# Patient Record
Sex: Male | Born: 1955 | Race: White | Hispanic: No | Marital: Single | Smoking: Former smoker
Health system: Southern US, Community
[De-identification: ages and names within clinical notes are randomized; demographics above are authoritative.]

## PROBLEM LIST (undated history)

## (undated) DIAGNOSIS — F1020 Alcohol dependence, uncomplicated: Secondary | ICD-10-CM

## (undated) DIAGNOSIS — K7460 Unspecified cirrhosis of liver: Secondary | ICD-10-CM

## (undated) DIAGNOSIS — F329 Major depressive disorder, single episode, unspecified: Secondary | ICD-10-CM

## (undated) DIAGNOSIS — R7302 Impaired glucose tolerance (oral): Secondary | ICD-10-CM

## (undated) DIAGNOSIS — I10 Essential (primary) hypertension: Secondary | ICD-10-CM

## (undated) DIAGNOSIS — H269 Unspecified cataract: Secondary | ICD-10-CM

## (undated) DIAGNOSIS — K429 Umbilical hernia without obstruction or gangrene: Secondary | ICD-10-CM

## (undated) DIAGNOSIS — B1920 Unspecified viral hepatitis C without hepatic coma: Secondary | ICD-10-CM

## (undated) DIAGNOSIS — C801 Malignant (primary) neoplasm, unspecified: Secondary | ICD-10-CM

## (undated) DIAGNOSIS — Z720 Tobacco use: Secondary | ICD-10-CM

## (undated) DIAGNOSIS — F32A Depression, unspecified: Secondary | ICD-10-CM

## (undated) HISTORY — DX: Unspecified cataract: H26.9

## (undated) HISTORY — DX: Impaired glucose tolerance (oral): R73.02

## (undated) HISTORY — DX: Tobacco use: Z72.0

## (undated) HISTORY — DX: Malignant (primary) neoplasm, unspecified: C80.1

## (undated) HISTORY — DX: Unspecified cirrhosis of liver: K74.60

## (undated) HISTORY — DX: Depression, unspecified: F32.A

## (undated) HISTORY — DX: Alcohol dependence, uncomplicated: F10.20

## (undated) HISTORY — PX: INGUINAL HERNIA REPAIR: SHX194

## (undated) HISTORY — DX: Umbilical hernia without obstruction or gangrene: K42.9

## (undated) HISTORY — DX: Unspecified viral hepatitis C without hepatic coma: B19.20

## (undated) HISTORY — DX: Major depressive disorder, single episode, unspecified: F32.9

## (undated) HISTORY — PX: CATARACT EXTRACTION: SUR2

## (undated) HISTORY — PX: UMBILICAL HERNIA REPAIR: SHX196

## (undated) HISTORY — DX: Essential (primary) hypertension: I10

---

## 1998-01-10 ENCOUNTER — Encounter: Payer: Self-pay | Admitting: Gastroenterology

## 1998-03-11 ENCOUNTER — Encounter: Payer: Self-pay | Admitting: Gastroenterology

## 1998-06-03 ENCOUNTER — Encounter: Payer: Self-pay | Admitting: Gastroenterology

## 1998-09-22 ENCOUNTER — Encounter: Payer: Self-pay | Admitting: Gastroenterology

## 1999-04-11 ENCOUNTER — Encounter: Payer: Self-pay | Admitting: Gastroenterology

## 1999-07-25 ENCOUNTER — Encounter: Payer: Self-pay | Admitting: Gastroenterology

## 1999-09-07 ENCOUNTER — Encounter: Payer: Self-pay | Admitting: Gastroenterology

## 1999-12-06 ENCOUNTER — Encounter: Payer: Self-pay | Admitting: Gastroenterology

## 2001-07-18 ENCOUNTER — Encounter: Payer: Self-pay | Admitting: Gastroenterology

## 2002-05-29 ENCOUNTER — Encounter: Payer: Self-pay | Admitting: Gastroenterology

## 2002-07-28 ENCOUNTER — Encounter: Payer: Self-pay | Admitting: Gastroenterology

## 2003-05-21 ENCOUNTER — Encounter: Payer: Self-pay | Admitting: Gastroenterology

## 2003-06-11 ENCOUNTER — Encounter: Payer: Self-pay | Admitting: Gastroenterology

## 2004-02-07 ENCOUNTER — Encounter: Payer: Self-pay | Admitting: Gastroenterology

## 2004-02-09 ENCOUNTER — Encounter: Payer: Self-pay | Admitting: Gastroenterology

## 2004-04-21 ENCOUNTER — Ambulatory Visit: Payer: Self-pay | Admitting: Internal Medicine

## 2004-06-28 ENCOUNTER — Ambulatory Visit: Payer: Self-pay | Admitting: Internal Medicine

## 2004-07-11 ENCOUNTER — Ambulatory Visit: Payer: Self-pay | Admitting: Internal Medicine

## 2004-08-30 ENCOUNTER — Ambulatory Visit: Payer: Self-pay | Admitting: Internal Medicine

## 2006-05-01 ENCOUNTER — Ambulatory Visit: Payer: Self-pay | Admitting: Internal Medicine

## 2006-05-01 LAB — CONVERTED CEMR LAB
Albumin: 3.3 g/dL — ABNORMAL LOW (ref 3.5–5.2)
Alkaline Phosphatase: 140 units/L — ABNORMAL HIGH (ref 39–117)
CO2: 30 meq/L (ref 19–32)
Chol/HDL Ratio, serum: 3.4
Cholesterol: 145 mg/dL (ref 0–200)
Creatinine, Ser: 0.7 mg/dL (ref 0.4–1.5)
GFR calc non Af Amer: 127 mL/min
Glucose, Bld: 91 mg/dL (ref 70–99)
HCT: 46.8 % (ref 39.0–52.0)
HDL: 42.7 mg/dL (ref >39.0)
LDL Cholesterol: 90 mg/dL (ref 0–99)
MCHC: 33 g/dL (ref 30.0–36.0)
Platelets: 223 10*3/uL (ref 150–400)
RBC: 4.69 M/uL (ref 4.22–5.81)
RDW: 13.2 % (ref 11.5–14.6)
Sodium: 135 meq/L (ref 135–145)
Total Bilirubin: 0.7 mg/dL (ref 0.3–1.2)
Total Protein: 7 g/dL (ref 6.0–8.3)
Triglyceride fasting, serum: 62 mg/dL (ref 0–149)

## 2006-10-31 ENCOUNTER — Encounter: Payer: Self-pay | Admitting: Internal Medicine

## 2006-10-31 DIAGNOSIS — I10 Essential (primary) hypertension: Secondary | ICD-10-CM | POA: Insufficient documentation

## 2007-06-23 ENCOUNTER — Encounter: Payer: Self-pay | Admitting: Internal Medicine

## 2007-07-08 ENCOUNTER — Ambulatory Visit: Payer: Self-pay | Admitting: Internal Medicine

## 2008-01-05 ENCOUNTER — Telehealth: Payer: Self-pay | Admitting: Internal Medicine

## 2008-03-12 ENCOUNTER — Ambulatory Visit: Payer: Self-pay | Admitting: Internal Medicine

## 2008-03-12 DIAGNOSIS — F411 Generalized anxiety disorder: Secondary | ICD-10-CM | POA: Insufficient documentation

## 2008-04-06 ENCOUNTER — Telehealth: Payer: Self-pay | Admitting: Internal Medicine

## 2008-04-06 LAB — CONVERTED CEMR LAB
AST: 79 units/L — ABNORMAL HIGH (ref 0–37)
Basophils Absolute: 0.1 10*3/uL (ref 0.0–0.1)
Bilirubin, Direct: 0.3 mg/dL (ref 0.0–0.3)
Chloride: 94 meq/L — ABNORMAL LOW (ref 96–112)
Creatinine, Ser: 0.9 mg/dL (ref 0.4–1.5)
Eosinophils Absolute: 0.3 10*3/uL (ref 0.0–0.6)
Eosinophils Relative: 2.4 % (ref 0.0–5.0)
Glucose, Bld: 75 mg/dL (ref 70–99)
HCT: 50.3 % (ref 39.0–52.0)
Hemoglobin: 17 g/dL (ref 13.0–17.0)
MCHC: 33.9 g/dL (ref 30.0–36.0)
MCV: 99.6 fL (ref 78.0–100.0)
Monocytes Absolute: 1.9 10*3/uL — ABNORMAL HIGH (ref 0.2–0.7)
Neutrophils Relative %: 51.3 % (ref 43.0–77.0)
PSA: 3.26 ng/mL (ref 0.10–4.00)
Potassium: 3.8 meq/L (ref 3.5–5.1)
RBC: 5.05 M/uL (ref 4.22–5.81)
RDW: 12.7 % (ref 11.5–14.6)
Sodium: 133 meq/L — ABNORMAL LOW (ref 135–145)
Total Bilirubin: 0.9 mg/dL (ref 0.3–1.2)
WBC: 12.6 10*3/uL — ABNORMAL HIGH (ref 4.5–10.5)

## 2008-04-09 ENCOUNTER — Telehealth: Payer: Self-pay | Admitting: Internal Medicine

## 2008-04-13 HISTORY — PX: COLONOSCOPY: SHX174

## 2008-04-19 ENCOUNTER — Ambulatory Visit: Payer: Self-pay | Admitting: Gastroenterology

## 2008-05-03 ENCOUNTER — Ambulatory Visit: Payer: Self-pay | Admitting: Gastroenterology

## 2008-05-03 ENCOUNTER — Encounter: Payer: Self-pay | Admitting: Gastroenterology

## 2008-05-05 ENCOUNTER — Encounter: Payer: Self-pay | Admitting: Gastroenterology

## 2008-05-11 ENCOUNTER — Telehealth: Payer: Self-pay | Admitting: Internal Medicine

## 2008-05-28 ENCOUNTER — Encounter: Payer: Self-pay | Admitting: Internal Medicine

## 2009-01-26 ENCOUNTER — Ambulatory Visit: Payer: Self-pay | Admitting: Internal Medicine

## 2009-01-26 DIAGNOSIS — R634 Abnormal weight loss: Secondary | ICD-10-CM | POA: Insufficient documentation

## 2009-01-26 LAB — CONVERTED CEMR LAB
ALT: 84 units/L — ABNORMAL HIGH (ref 0–53)
AST: 80 units/L — ABNORMAL HIGH (ref 0–37)
Alkaline Phosphatase: 112 units/L (ref 39–117)
Basophils Relative: 0.3 % (ref 0.0–3.0)
Bilirubin, Direct: 0.2 mg/dL (ref 0.0–0.3)
Calcium: 8.3 mg/dL — ABNORMAL LOW (ref 8.4–10.5)
Creatinine, Ser: 1.1 mg/dL (ref 0.4–1.5)
Eosinophils Absolute: 0 10*3/uL (ref 0.0–0.7)
Eosinophils Relative: 0.2 % (ref 0.0–5.0)
GFR calc non Af Amer: 74.47 mL/min (ref >60)
Hemoglobin: 15.9 g/dL (ref 13.0–17.0)
Lymphocytes Relative: 4.4 % — ABNORMAL LOW (ref 12.0–46.0)
Monocytes Relative: 11.6 % (ref 3.0–12.0)
Neutrophils Relative %: 83.5 % — ABNORMAL HIGH (ref 43.0–77.0)
RBC: 4.59 M/uL (ref 4.22–5.81)
Sodium: 136 meq/L (ref 135–145)
Total Protein: 6.7 g/dL (ref 6.0–8.3)
WBC: 17 10*3/uL — ABNORMAL HIGH (ref 4.5–10.5)

## 2009-01-28 ENCOUNTER — Encounter: Admission: RE | Admit: 2009-01-28 | Discharge: 2009-01-28 | Payer: Self-pay | Admitting: Internal Medicine

## 2009-01-31 ENCOUNTER — Telehealth: Payer: Self-pay | Admitting: Internal Medicine

## 2009-01-31 DIAGNOSIS — R1084 Generalized abdominal pain: Secondary | ICD-10-CM | POA: Insufficient documentation

## 2009-02-02 ENCOUNTER — Ambulatory Visit: Payer: Self-pay | Admitting: Internal Medicine

## 2009-02-02 DIAGNOSIS — F10239 Alcohol dependence with withdrawal, unspecified: Secondary | ICD-10-CM | POA: Insufficient documentation

## 2009-02-02 DIAGNOSIS — K5900 Constipation, unspecified: Secondary | ICD-10-CM | POA: Insufficient documentation

## 2009-02-02 DIAGNOSIS — F10939 Alcohol use, unspecified with withdrawal, unspecified: Secondary | ICD-10-CM | POA: Insufficient documentation

## 2009-02-06 ENCOUNTER — Encounter: Admission: RE | Admit: 2009-02-06 | Discharge: 2009-02-06 | Payer: Self-pay | Admitting: Internal Medicine

## 2009-02-07 ENCOUNTER — Telehealth (INDEPENDENT_AMBULATORY_CARE_PROVIDER_SITE_OTHER): Payer: Self-pay | Admitting: *Deleted

## 2009-03-11 ENCOUNTER — Ambulatory Visit: Payer: Self-pay | Admitting: Internal Medicine

## 2009-06-22 ENCOUNTER — Emergency Department (HOSPITAL_COMMUNITY): Admission: EM | Admit: 2009-06-22 | Discharge: 2009-06-22 | Payer: Self-pay | Admitting: Family Medicine

## 2009-07-12 HISTORY — PX: OTHER SURGICAL HISTORY: SHX169

## 2009-07-13 ENCOUNTER — Ambulatory Visit (HOSPITAL_COMMUNITY): Admission: RE | Admit: 2009-07-13 | Discharge: 2009-07-13 | Payer: Self-pay | Admitting: Emergency Medicine

## 2009-07-13 ENCOUNTER — Emergency Department (HOSPITAL_COMMUNITY): Admission: EM | Admit: 2009-07-13 | Discharge: 2009-07-13 | Payer: Self-pay | Admitting: Emergency Medicine

## 2009-07-15 ENCOUNTER — Ambulatory Visit: Payer: Self-pay | Admitting: Internal Medicine

## 2009-07-27 ENCOUNTER — Telehealth: Payer: Self-pay | Admitting: Internal Medicine

## 2009-07-28 ENCOUNTER — Encounter: Payer: Self-pay | Admitting: Internal Medicine

## 2009-08-02 ENCOUNTER — Ambulatory Visit (HOSPITAL_BASED_OUTPATIENT_CLINIC_OR_DEPARTMENT_OTHER): Admission: RE | Admit: 2009-08-02 | Discharge: 2009-08-02 | Payer: Self-pay | Admitting: Urology

## 2009-08-11 ENCOUNTER — Encounter: Payer: Self-pay | Admitting: Internal Medicine

## 2009-08-30 ENCOUNTER — Ambulatory Visit: Payer: Self-pay | Admitting: Internal Medicine

## 2009-08-30 ENCOUNTER — Telehealth: Payer: Self-pay

## 2009-08-30 DIAGNOSIS — K7030 Alcoholic cirrhosis of liver without ascites: Secondary | ICD-10-CM | POA: Insufficient documentation

## 2009-08-30 LAB — CONVERTED CEMR LAB
Albumin: 2.7 g/dL — ABNORMAL LOW (ref 3.5–5.2)
BUN: 7 mg/dL (ref 6–23)
Basophils Absolute: 0 10*3/uL (ref 0.0–0.1)
Creatinine, Ser: 0.8 mg/dL (ref 0.4–1.5)
GFR calc non Af Amer: 107.3 mL/min (ref >60)
Glucose, Bld: 107 mg/dL — ABNORMAL HIGH (ref 70–99)
HCT: 45.5 % (ref 39.0–52.0)
Lymphs Abs: 3.2 10*3/uL (ref 0.7–4.0)
MCV: 98.2 fL (ref 78.0–100.0)
Monocytes Absolute: 1.9 10*3/uL — ABNORMAL HIGH (ref 0.1–1.0)
Monocytes Relative: 11.7 % (ref 3.0–12.0)
Platelets: 262 10*3/uL (ref 150.0–400.0)
Potassium: 2.8 meq/L — CL (ref 3.5–5.1)
RDW: 13.8 % (ref 11.5–14.6)
TSH: 1.72 microintl units/mL (ref 0.35–5.50)

## 2009-09-08 ENCOUNTER — Ambulatory Visit: Payer: Self-pay | Admitting: Internal Medicine

## 2009-09-08 ENCOUNTER — Encounter (INDEPENDENT_AMBULATORY_CARE_PROVIDER_SITE_OTHER): Payer: Self-pay | Admitting: *Deleted

## 2009-09-08 LAB — CONVERTED CEMR LAB
Basophils Relative: 0.7 % (ref 0.0–3.0)
CO2: 36 meq/L — ABNORMAL HIGH (ref 19–32)
Chloride: 96 meq/L (ref 96–112)
Eosinophils Absolute: 0.3 10*3/uL (ref 0.0–0.7)
HCT: 47.3 % (ref 39.0–52.0)
Hemoglobin: 16.1 g/dL (ref 13.0–17.0)
MCHC: 34.2 g/dL (ref 30.0–36.0)
MCV: 96.6 fL (ref 78.0–100.0)
Monocytes Absolute: 1.9 10*3/uL — ABNORMAL HIGH (ref 0.1–1.0)
Neutro Abs: 6.5 10*3/uL (ref 1.4–7.7)
RBC: 4.89 M/uL (ref 4.22–5.81)
Sodium: 141 meq/L (ref 135–145)

## 2009-09-16 ENCOUNTER — Ambulatory Visit: Payer: Self-pay | Admitting: Gastroenterology

## 2009-09-16 DIAGNOSIS — J449 Chronic obstructive pulmonary disease, unspecified: Secondary | ICD-10-CM | POA: Insufficient documentation

## 2009-09-16 DIAGNOSIS — R197 Diarrhea, unspecified: Secondary | ICD-10-CM | POA: Insufficient documentation

## 2009-09-16 DIAGNOSIS — M2550 Pain in unspecified joint: Secondary | ICD-10-CM | POA: Insufficient documentation

## 2009-09-16 DIAGNOSIS — N508 Other specified disorders of male genital organs: Secondary | ICD-10-CM | POA: Insufficient documentation

## 2009-09-16 DIAGNOSIS — K219 Gastro-esophageal reflux disease without esophagitis: Secondary | ICD-10-CM | POA: Insufficient documentation

## 2009-09-16 DIAGNOSIS — R160 Hepatomegaly, not elsewhere classified: Secondary | ICD-10-CM | POA: Insufficient documentation

## 2009-09-16 LAB — CONVERTED CEMR LAB
AST: 45 units/L — ABNORMAL HIGH (ref 0–37)
BUN: 7 mg/dL (ref 6–23)
Basophils Relative: 0.7 % (ref 0.0–3.0)
Calcium: 8.8 mg/dL (ref 8.4–10.5)
Creatinine, Ser: 0.5 mg/dL (ref 0.4–1.5)
Eosinophils Absolute: 0.5 10*3/uL (ref 0.0–0.7)
Ferritin: 206.9 ng/mL (ref 22.0–322.0)
Folate: 4 ng/mL
HCV Quantitative: 1690000 intl units/mL — ABNORMAL HIGH (ref <43)
Hep A Total Ab: POSITIVE — AB
Hepatitis B Surface Ag: NEGATIVE
Iron: 64 ug/dL (ref 42–165)
MCHC: 34.1 g/dL (ref 30.0–36.0)
MCV: 97.6 fL (ref 78.0–100.0)
Magnesium: 2 mg/dL (ref 1.5–2.5)
Monocytes Absolute: 1.6 10*3/uL — ABNORMAL HIGH (ref 0.1–1.0)
Neutrophils Relative %: 51 % (ref 43.0–77.0)
Platelets: 247 10*3/uL (ref 150.0–400.0)
Potassium: 3.8 meq/L (ref 3.5–5.1)
RBC: 4.71 M/uL (ref 4.22–5.81)
TSH: 1.99 microintl units/mL (ref 0.35–5.50)
Total Bilirubin: 0.4 mg/dL (ref 0.3–1.2)
Vitamin B-12: 833 pg/mL (ref 211–911)

## 2009-09-19 ENCOUNTER — Ambulatory Visit (HOSPITAL_COMMUNITY): Admission: RE | Admit: 2009-09-19 | Discharge: 2009-09-19 | Payer: Self-pay | Admitting: Gastroenterology

## 2009-09-19 ENCOUNTER — Encounter: Payer: Self-pay | Admitting: Gastroenterology

## 2009-09-20 ENCOUNTER — Telehealth: Payer: Self-pay | Admitting: Gastroenterology

## 2009-09-22 ENCOUNTER — Encounter: Payer: Self-pay | Admitting: Internal Medicine

## 2009-12-09 ENCOUNTER — Ambulatory Visit: Payer: Self-pay | Admitting: Internal Medicine

## 2010-01-22 ENCOUNTER — Emergency Department (HOSPITAL_COMMUNITY): Admission: EM | Admit: 2010-01-22 | Discharge: 2010-01-22 | Payer: Self-pay | Admitting: Emergency Medicine

## 2010-02-14 ENCOUNTER — Telehealth: Payer: Self-pay | Admitting: Internal Medicine

## 2010-05-25 ENCOUNTER — Other Ambulatory Visit: Payer: Self-pay | Admitting: Internal Medicine

## 2010-05-25 ENCOUNTER — Ambulatory Visit
Admission: RE | Admit: 2010-05-25 | Discharge: 2010-05-25 | Payer: Self-pay | Source: Home / Self Care | Attending: Internal Medicine | Admitting: Internal Medicine

## 2010-05-25 DIAGNOSIS — F341 Dysthymic disorder: Secondary | ICD-10-CM | POA: Insufficient documentation

## 2010-05-25 LAB — BASIC METABOLIC PANEL
BUN: 13 mg/dL (ref 6–23)
CO2: 27 mEq/L (ref 19–32)
Calcium: 8.7 mg/dL (ref 8.4–10.5)
Chloride: 105 mEq/L (ref 96–112)
Creatinine, Ser: 0.6 mg/dL (ref 0.4–1.5)
GFR: 149.13 mL/min (ref >60.00)
Glucose, Bld: 69 mg/dL — ABNORMAL LOW (ref 70–99)
Potassium: 3.7 mEq/L (ref 3.5–5.1)
Sodium: 143 mEq/L (ref 135–145)

## 2010-05-25 LAB — HEPATIC FUNCTION PANEL
ALT: 66 U/L — ABNORMAL HIGH (ref 0–53)
AST: 90 U/L — ABNORMAL HIGH (ref 0–37)
Albumin: 3.7 g/dL (ref 3.5–5.2)
Alkaline Phosphatase: 154 U/L — ABNORMAL HIGH (ref 39–117)
Bilirubin, Direct: 0.1 mg/dL (ref 0.0–0.3)
Total Bilirubin: 0.3 mg/dL (ref 0.3–1.2)
Total Protein: 7.3 g/dL (ref 6.0–8.3)

## 2010-05-25 LAB — CBC WITH DIFFERENTIAL/PLATELET
Basophils Absolute: 0.1 10*3/uL (ref 0.0–0.1)
Basophils Relative: 0.9 % (ref 0.0–3.0)
Eosinophils Absolute: 0.5 10*3/uL (ref 0.0–0.7)
Eosinophils Relative: 4.5 % (ref 0.0–5.0)
HCT: 47.2 % (ref 39.0–52.0)
Hemoglobin: 16 g/dL (ref 13.0–17.0)
Lymphocytes Relative: 40.7 % (ref 12.0–46.0)
Lymphs Abs: 4.1 10*3/uL — ABNORMAL HIGH (ref 0.7–4.0)
MCHC: 33.9 g/dL (ref 30.0–36.0)
MCV: 102.5 fl — ABNORMAL HIGH (ref 78.0–100.0)
Monocytes Absolute: 1.4 10*3/uL — ABNORMAL HIGH (ref 0.1–1.0)
Monocytes Relative: 14.3 % — ABNORMAL HIGH (ref 3.0–12.0)
Neutro Abs: 4 10*3/uL (ref 1.4–7.7)
Neutrophils Relative %: 39.6 % — ABNORMAL LOW (ref 43.0–77.0)
Platelets: 294 10*3/uL (ref 150.0–400.0)
RBC: 4.61 Mil/uL (ref 4.22–5.81)
RDW: 13.5 % (ref 11.5–14.6)
WBC: 10 10*3/uL (ref 4.5–10.5)

## 2010-05-25 LAB — TSH: TSH: 3.16 u[IU]/mL (ref 0.35–5.50)

## 2010-06-13 NOTE — Letter (Signed)
Summary: [facility]  [facility]   Imported By: Sherian Rein 10/03/2009 10:41:15  _____________________________________________________________________  External Attachment:    Type:   Image     Comment:   External Document

## 2010-06-13 NOTE — Letter (Signed)
Summary: [facility]  [facility]   Imported By: Sherian Rein 10/03/2009 10:42:56  _____________________________________________________________________  External Attachment:    Type:   Image     Comment:   External Document

## 2010-06-13 NOTE — Letter (Signed)
Summary: [facility]  [facility]   Imported By: Sherian Rein 10/03/2009 10:46:50  _____________________________________________________________________  External Attachment:    Type:   Image     Comment:   External Document

## 2010-06-13 NOTE — Letter (Signed)
Summary: [facility]  [facility]   Imported By: Sherian Rein 10/03/2009 10:37:36  _____________________________________________________________________  External Attachment:    Type:   Image     Comment:   External Document

## 2010-06-13 NOTE — Letter (Signed)
Summary: [facility]  [facility]   Imported By: Sherian Rein 10/03/2009 10:42:03  _____________________________________________________________________  External Attachment:    Type:   Image     Comment:   External Document

## 2010-06-13 NOTE — Assessment & Plan Note (Signed)
Summary: fup er---contusion of testicle//ccm   Vital Signs:  Patient profile:   55 year old male Weight:      166 pounds Temp:     97.8 degrees F oral BP sitting:   110 / 64  (right arm) Cuff size:   regular  Vitals Entered By: Duard Brady LPN (July 16, 5407 1:09 PM) CC: c/o injury to (L) testical - 3days ago , swelling , seen in ER Is Patient Diabetic? No   CC:  c/o injury to (L) testical - 3days ago , swelling , and seen in ER.  History of Present Illness: 55 year old patient seen today  for follow-up; he was evaluated in the ED two days ago after a trauma to the left testicular area.  he was retraumatized with a 100-pound dog yesterday. He has been using ibuprofen analgesics and ice.  He has a history of EtOH and hepatitis C.  He states that he has been abstinent for 3 months.  He has treated hypertension, but apparently presently on no medication.  He has a history of anxiety, depression.  Preventive Screening-Counseling & Management  Alcohol-Tobacco     Smoking Status: current     Smoking Cessation Counseling: yes  Allergies (verified): No Known Drug Allergies  Past History:  Past Medical History: Reviewed history from 02/02/2009 and no changes required. Hepatitis C Hypertension  tobacco use chronic alcoholism umbilical  hernia glucose intolerance history depression weight loss cirrhosis  Past Surgical History: Reviewed history from 02/02/2009 and no changes required. Cataract extraction Inguinal herniorrhaphy umbilical hernia repair  Review of Systems       The patient complains of testicular masses.  The patient denies anorexia, fever, weight loss, weight gain, vision loss, decreased hearing, hoarseness, chest pain, syncope, dyspnea on exertion, peripheral edema, prolonged cough, headaches, hemoptysis, abdominal pain, melena, hematochezia, severe indigestion/heartburn, hematuria, incontinence, genital sores, muscle weakness, suspicious skin lesions,  transient blindness, difficulty walking, depression, unusual weight change, abnormal bleeding, enlarged lymph nodes, angioedema, and breast masses.    Physical Exam  General:  Well-developed,well-nourished,in no acute distress; alert,appropriate and cooperative throughout examination;blood pressure  120/72 Head:  Normocephalic and atraumatic without obvious abnormalities. No apparent alopecia or balding. Eyes:  No corneal or conjunctival inflammation noted. EOMI. Perrla. Funduscopic exam benign, without hemorrhages, exudates or papilledema. Vision grossly normal. Mouth:  Oral mucosa and oropharynx without lesions or exudates.  Teeth in good repair. Neck:  No deformities, masses, or tenderness noted. Lungs:  Normal respiratory effort, chest expands symmetrically. Lungs are clear to auscultation, no crackles or wheezes. Heart:  Normal rate and regular rhythm. S1 and S2 normal without gallop, murmur, click, rub or other extra sounds. Abdomen:  hepato- megaly Genitalia:  left testicular swelling and pain Msk:  No deformity or scoliosis noted of thoracic or lumbar spine.   Extremities:  digital clubbing   Impression & Recommendations:  Problem # 1:  ANXIETY DISORDER (ICD-300.00)  His updated medication list for this problem includes:    Trazodone Hcl 50 Mg Tabs (Trazodone hcl) .Marland Kitchen... 2 at bedtime    Citalopram Hydrobromide 20 Mg Tabs (Citalopram hydrobromide) ..... One daily  His updated medication list for this problem includes:    Trazodone Hcl 50 Mg Tabs (Trazodone hcl) .Marland Kitchen... 2 at bedtime    Citalopram Hydrobromide 20 Mg Tabs (Citalopram hydrobromide) ..... One daily  Problem # 2:  HYPERTENSION (ICD-401.9) presently  controlled off medication  Problem # 3:  HEPATITIS C (ICD-070.51) abdominal MRI performed September last year  Complete  Medication List: 1)  Trazodone Hcl 50 Mg Tabs (Trazodone hcl) .... 2 at bedtime 2)  Citalopram Hydrobromide 20 Mg Tabs (Citalopram hydrobromide) ....  One daily 3)  Thiamine Hcl 100 Mg Tabs (Thiamine hcl) .... One daily 4)  Hydrocodone-acetaminophen 5-500 Mg Tabs (Hydrocodone-acetaminophen) .... One every 6 hours as needed for pain  Patient Instructions: 1)  Please schedule a follow-up appointment in 3 months. 2)  Limit your Sodium (Salt). 3)  Tobacco is very bad for your health and your loved ones! You Should stop smoking!. 4)  It is important that you exercise regularly at least 20 minutes 5 times a week. If you develop chest pain, have severe difficulty breathing, or feel very tired , stop exercising immediately and seek medical attention. Prescriptions: HYDROCODONE-ACETAMINOPHEN 5-500 MG TABS (HYDROCODONE-ACETAMINOPHEN) one every 6 hours as needed for pain  #30 x 0   Entered and Authorized by:   Gordy Savers  MD   Signed by:   Gordy Savers  MD on 07/15/2009   Method used:   Print then Give to Patient   RxID:   [id number] THIAMINE HCL 100 MG TABS (THIAMINE HCL) one daily  #100 x 6   Entered and Authorized by:   Gordy Savers  MD   Signed by:   Gordy Savers  MD on 07/15/2009   Method used:   Electronically to        [facility]  [phone]* (retail)       [address] [place], [place]  [address]       Ph: [phone] or [phone]       Fax: [phone]   RxID:   [id number] CITALOPRAM HYDROBROMIDE 20 MG TABS (CITALOPRAM HYDROBROMIDE) one daily  #90 Tablet x 6   Entered and Authorized by:   Gordy Savers  MD   Signed by:   Gordy Savers  MD on 07/15/2009   Method used:   Electronically to        [facility]  [phone]* (retail)       [address] [place], [place]  [address]       Ph: [phone] or [phone]       Fax: [phone]   RxID:   [id number] TRAZODONE HCL 50 MG  TABS (TRAZODONE HCL) 2 at bedtime  #180 x 4   Entered and Authorized by:   Gordy Savers  MD   Signed by:   Gordy Savers  MD on 07/15/2009   Method used:    Electronically to        [facility]  [phone]* (retail)       [address] [place], [place]  [address]       Ph: [phone] or [phone]       Fax: [phone]   RxID:   [id number]

## 2010-06-13 NOTE — Assessment & Plan Note (Signed)
Summary: 3 MO ROV/MM   Vital Signs:  Patient profile:   55 year old male Weight:      157 pounds Temp:     97.9 degrees F oral BP sitting:   150 / 100  (right arm) Cuff size:   regular  Vitals Entered By: Duard Brady LPN (December 09, 2009 8:14 AM) CC: 3 mos rov - doing ok Is Patient Diabetic? No   Primary Care Provider:  Gordy Savers  MD  CC:  3 mos rov - doing ok.  History of Present Illness: 55 year old patient who is seen today for follow-up.  He has a history of chronic hepatitis c and chronic alcoholism.  He states that he has been abstinent since May 16.  Today he feels quite well.  He does have a history of hypertension that has required treatment in the past.  The pressure medication was tapered earlier the two hypotension associated with weight loss.  He continues to monitor home blood pressure readings and states blood pressures are usually a normal range  Preventive Screening-Counseling & Management  Alcohol-Tobacco     Smoking Status: current  Allergies (verified): No Known Drug Allergies  Past History:  Past Medical History: Reviewed history from 02/02/2009 and no changes required. Hepatitis C Hypertension  tobacco use chronic alcoholism umbilical  hernia glucose intolerance history depression weight loss cirrhosis  Review of Systems  The patient denies anorexia, fever, weight loss, weight gain, vision loss, decreased hearing, hoarseness, chest pain, syncope, dyspnea on exertion, peripheral edema, prolonged cough, headaches, hemoptysis, abdominal pain, melena, hematochezia, severe indigestion/heartburn, hematuria, incontinence, genital sores, muscle weakness, suspicious skin lesions, transient blindness, difficulty walking, depression, unusual weight change, abnormal bleeding, enlarged lymph nodes, angioedema, breast masses, and testicular masses.    Physical Exam  General:  Well-developed,well-nourished,in no acute distress;  alert,appropriate and cooperative throughout examination; 150/90 Head:  Normocephalic and atraumatic without obvious abnormalities. No apparent alopecia or balding. Eyes:  No corneal or conjunctival inflammation noted. EOMI. Perrla. Funduscopic exam benign, without hemorrhages, exudates or papilledema. Vision grossly normal. Mouth:  Oral mucosa and oropharynx without lesions or exudates.  Teeth in good repair. Neck:  No deformities, masses, or tenderness noted. Lungs:  Normal respiratory effort, chest expands symmetrically. Lungs are clear to auscultation, no crackles or wheezes. Heart:  Normal rate and regular rhythm. S1 and S2 normal without gallop, murmur, click, rub or other extra sounds. Abdomen:  hepato- megaly Msk:  No deformity or scoliosis noted of thoracic or lumbar spine.   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 2 at bedtime 2)  Citalopram Hydrobromide 20 Mg Tabs (Citalopram hydrobromide) .... One daily 3)  Thiamine Hcl 100 Mg Tabs (Thiamine hcl) .... One daily 4)  Klor-con M20 20 Meq Cr-tabs (Potassium chloride crys cr) .... 2 by mouth two times a day for 5 days then 1 by mouth bid 5)  Triamcinolone Acetonide 0.1 % Crea (Triamcinolone acetonide) .... Use twice daily 6)  Dexilant 60 Mg Cpdr (Dexlansoprazole) .... Take one by mouth once daily 7)  Metronidazole 250 Mg Tabs (Metronidazole) .... Three times a day 8)  Florastor 250 Mg Caps (Saccharomyces boulardii) .... Daily x 10 days 9)  Folic Acid 1 Mg  Tabs (Folic acid) .Marland Kitchen.. 1 qd  Patient Instructions: 1)  Please schedule a follow-up appointment in 3 months. 2)  Limit your Sodium (Salt). 3)  It is important that you  exercise regularly at least 20 minutes 5 times a week. If you develop chest pain, have severe difficulty breathing, or feel very tired , stop exercising immediately and seek medical attention. 4)  Check your Blood Pressure regularly. If it is above: 150/90 you should make an appointment.

## 2010-06-13 NOTE — Letter (Signed)
Summary: [facility]  [facility]   Imported By: Sherian Rein 10/03/2009 10:44:20  _____________________________________________________________________  External Attachment:    Type:   Image     Comment:   External Document

## 2010-06-13 NOTE — Letter (Signed)
Summary: [facility]  [facility]   Imported By: Sherian Rein 10/03/2009 10:46:02  _____________________________________________________________________  External Attachment:    Type:   Image     Comment:   External Document

## 2010-06-13 NOTE — Assessment & Plan Note (Signed)
Summary: weight loss, hepc, cirrhosis/lk    History of Present Illness Visit Type: Initial Consult Primary GI MD: Sheryn Bison MD FACP FAGA Primary Provider: Gordy Savers  MD Requesting Provider: Gordy Savers  MD Chief Complaint: Patient referred for weight loss but he feels he is gaining some weight. He states he is having some fecal incontinence, he has to wear a depends when he goes out to prevent any accident from happening. Patient does have hep C.  History of Present Illness:   Complicated 55 year old chronic alcoholic with chronic hepatitis C previously under the care of Dr. Bernette Redbird at [facility] gastroenterology. I have no records for review. Apparently he was not felt to be a candidate for therapy because of active alcoholism. He is currently followed by Dr. Beverely Low in primary care.  Patient was recently hospitalized and apparent had an orchiectomy performed, was placed on antibiotics, and has had 2 months of persistent diarrhea and abdominal cramping. I did colonoscopy screening several years ago that was unremarkable. He currently is doing better with his diarrhea but continues to have some incontinency. He denies rectal bleeding, fever, chills, or other systemic complaints. He allegedly has not used alcohol since Super Bowl weekend and denies IV drug use. Apparently in the past he has used drugs and this is how he contacted hepatitis C. He denies current mental status changes, nausea vomiting, but does have chronic GERD and has recently been placed on Dexilant 60 mg with good improvement. He denies any episodes of GI hemorrhage, clay colored stools, dark urine, fever or chills. Review of his labs does show previous evidence of alcoholic hepatitis, but recent liver function tests had fairly normalized except for low albumin level. His neck problems previously with ascites, edema, or any other metabolic problems. He is on thiamine capsules, citalopram, and trazodone.  He continues to smoke heavily. He has had previous repair of an umbilical and inguinal hernia. He lives with his stepfather Dr. Roseanne Kaufman who is a retired Development worker, community.   GI Review of Systems    Reports abdominal pain.     Location of  Abdominal pain: lower abdomen.    Denies acid reflux, belching, bloating, chest pain, dysphagia with liquids, dysphagia with solids, heartburn, loss of appetite, nausea, vomiting, vomiting blood, weight loss, and  weight gain.      Reports change in bowel habits, constipation, diarrhea, fecal incontinence, and  liver problems.     Denies anal fissure, black tarry stools, diverticulosis, heme positive stool, hemorrhoids, irritable bowel syndrome, jaundice, light color stool, rectal bleeding, and  rectal pain.    Current Medications (verified): 1)  Trazodone Hcl 50 Mg  Tabs (Trazodone Hcl) .... 2 At Bedtime 2)  Citalopram Hydrobromide 20 Mg Tabs (Citalopram Hydrobromide) .... One Daily 3)  Thiamine Hcl 100 Mg Tabs (Thiamine Hcl) .... One Daily 4)  Klor-Con M20 20 Meq Cr-Tabs (Potassium Chloride Crys Cr) .... 2 By Mouth Two Times A Day For 5 Days Then 1 By Mouth Bid 5)  Triamcinolone Acetonide 0.1 % Crea (Triamcinolone Acetonide) .... Use Twice Daily 6)  Dexilant 60 Mg Cpdr (Dexlansoprazole) .... Take One By Mouth Once Daily  Allergies (verified): No Known Drug Allergies  Past History:  Past medical, surgical, family and social histories (including risk factors) reviewed for relevance to current acute and chronic problems.  Past Medical History: Reviewed history from 02/02/2009 and no changes required. Hepatitis C Hypertension  tobacco use chronic alcoholism umbilical  hernia glucose intolerance history depression weight  loss cirrhosis  Past Surgical History: Cataract extraction Inguinal herniorrhaphy umbilical hernia repair colonoscopy 12/09   Left orchiectomy March 2011  Family History: Reviewed history from 07/08/2007 and no changes  required. details of his fathers health unknown mother is in excellent health no siblings  Social History: Reviewed history from 08/30/2009 and no changes required.  painter and Optician, dispensing Single present has a live-in girlfriend Current Smoker 1/2 PPD has been abstinent for a couple weeks and now has a sponsor and a girlfriend who does not drink step-father is Roseanne Kaufman  MD Daily Caffeine Use 2 per day  Review of Systems General:  Complains of fatigue and sleep disorder. ENT:  Denies earache, ear discharge, tinnitus, decreased hearing, nasal congestion, loss of smell, nosebleeds, sore throat, hoarseness, and difficulty swallowing. CV:  Denies chest pains, angina, palpitations, syncope, dyspnea on exertion, orthopnea, PND, peripheral edema, and claudication. Resp:  Denies dyspnea at rest, dyspnea with exercise, cough, sputum, wheezing, coughing up blood, and pleurisy. GI:  Complains of abdominal pain, diarrhea, and change in bowel habits; denies difficulty swallowing, pain on swallowing, nausea, indigestion/heartburn, vomiting, vomiting blood, jaundice, gas/bloating, constipation, bloody BM's, black BMs, and fecal incontinence. GU:  Denies urinary burning, blood in urine, urinary frequency, urinary hesitancy, nocturnal urination, urinary incontinence, penile discharge, genital sores, decreased libido, and erectile dysfunction. MS:  Complains of joint swelling, joint stiffness, and muscle cramps; denies joint pain / LOM, joint deformity, low back pain, muscle weakness, muscle atrophy, leg pain at night, leg pain with exertion, and shoulder pain / LOM hand / wrist pain (CTS). Derm:  Denies rash, itching, dry skin, hives, moles, warts, and unhealing ulcers. Neuro:  Denies weakness, paralysis, abnormal sensation, seizures, syncope, tremors, vertigo, transient blindness, frequent falls, frequent headaches, difficulty walking, headache, sciatica, radiculopathy other:, restless legs, memory  loss, and confusion; Some periodic ataxia. He denies other neurological problems.Marland Kitchen Psych:  Complains of depression and anxiety; denies memory loss, suicidal ideation, hallucinations, paranoia, phobia, and confusion. Endo:  Denies cold intolerance, heat intolerance, polydipsia, polyphagia, polyuria, unusual weight change, and hirsutism; Appetite is improving apparently has gained 7 pounds in weight over the last 6 months.. Heme:  Denies bruising, bleeding, enlarged lymph nodes, and pagophagia. Allergy:  Denies hives, rash, sneezing, hay fever, and recurrent infections.  Vital Signs:  Patient profile:   55 year old male Height:      72 inches Weight:      151.4 pounds BMI:     20.61 Pulse rate:   80 / minute Pulse rhythm:   regular BP sitting:   110 / 76  (right arm) Cuff size:   regular  Vitals Entered By: Harlow Mares CMA Duncan Dull) (Sep 16, 2009 9:04 AM)  Physical Exam  General:  Well developed, well nourished, no acute distress.Chronically ill-appearing but well kept Caucasian male in no acute distress. I cannot appreciate icterus or stigmata of chronic liver disease. He is oriented x3. Head:  Normocephalic and atraumatic. Eyes:  PERRLA, no icterus.exam deferred to patient's ophthalmologist.   Neck:  Supple; no masses or thyromegaly. Lungs:  decreased BS on L and decreased BS on R.   Heart:  Regular rate and rhythm; no murmurs, rubs,  or bruits. Abdomen:  Marked hepatomegaly with enlarged left and right lobes of the liver 10 cm below the right costal margin with some nodularity but no tenderness to the liver. I cannot appreciate splenomegaly, other abdominal masses, tenderness, or evidence of ascites. Bowel sounds are normal and there is no bruit  noted. Rectal:  Normal exam.hemocult negative.  Stool is semi-formed and not purulent or bloody. Prostate:  .normal size prostate.   Msk:  Symmetrical with no gross deformities. Normal posture. Pulses:  Normal pulses noted. Extremities:  No  clubbing, cyanosis, edema or deformities noted. Neurologic:  Alert and  oriented x4;  grossly normal neurologically.No asterixis present or evidence of cerebellar dysfunction. Cervical Nodes:  No significant cervical adenopathy. Psych:  Alert and cooperative. Normal mood and affect.   Impression & Recommendations:  Problem # 1:  ESOPHAGEAL REFLUX (ICD-530.81) Assessment Improved Continue Exelon 60 mg a day. He will probably need endoscopy for varices screening. Orders: TLB-CBC Platelet - w/Differential (85025-CBCD) TLB-BMP (Basic Metabolic Panel-BMET) (80048-METABOL) TLB-Hepatic/Liver Function Pnl (80076-HEPATIC) TLB-TSH (Thyroid Stimulating Hormone) (84443-TSH) TLB-B12, Serum-Total ONLY (16109-U04) TLB-Ferritin (82728-FER) TLB-Folic Acid (Folate) (82746-FOL) TLB-IBC Pnl (Iron/FE;Transferrin) (83550-IBC) TLB-Amylase (82150-AMYL) TLB-PT (Protime) (85610-PTP) TLB-Lipase (83690-LIPASE) TLB-Magnesium (Mg) (83735-MG) T-Hepatitis B Surface Antigen (54098-11914) T-Alpha-Fetoprotein Serum (78295-62130) T-Hepatitis A Antibody (86578-46962) T-Hepatitis C RNA Quant PCR (95284-13244) T-HIV-1 (Screen) (01027) T-Culture, C-Diff Toxin A/B (25366-44034) T- * Misc. 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 Multiple labs have been ordered including an alpha-fetoprotein, prothrombin time, ammonia level, hepatitis C quantitation, other viral serologies, and upper abdominal ultrasound exam. Patient has been advised to continue abstinence from alcohol. He apparently has plans to enroll in AA outpatient treatment center in [place] in the near future. He previously has been a patient at [facility] rehabilitation center.  Problem # 3:  DIARRHEA (ICD-787.91) Assessment: Improved Probable  C. difficile infection related to recent hospitalization and antibiotic use. Stool C. difficile toxin has been ordered and I placed him on metronidazole 250 mg t.i.d. for 10 days along with Florstar probiotic therapy.  Problem # 4:  ANXIETY DISORDER (ICD-300.00) Assessment: Improved Continue Other Medications per Dr. Kirtland Bouchard  Problem # 5:  HEPATITIS C (ICD-070.51) Assessment: Unchanged Consider referral to hepatitis C clinic for liver biopsy and possible interferon-ribavirin treatment once sobriety documented.CDT level ordered today for verification of level of alcohol use.  Problem # 6:  TESTICULAR MASS, LEFT (ICD-608.89) Assessment: Improved Status Post removal of left testicle by urology.  Problem # 7:  PAIN IN JOINT, MULTIPLE SITES (ICD-719.49) Assessment: Unchanged Advised to avoid NSAIDs her increased risk for GI bleeding. Review of labs shows normal platelet count. Prothrombin time has been ordered.  Problem # 8:  COPD (ICD-496) Assessment: Deteriorated The Patient continues to smoke heavily. He denies current pulmonary complaints, but does not do much physical exertion. I suspect he does have significant COPD.  Other Orders: T-CDT (carbohydrate deficient transferrin) (56387-56433)  Patient Instructions: 1)  Please go to the basement for lab work. 2)  Begin Metronidazole, a prescription will be sent to your pharmacy. 3)  Begin Florastor daily for 2 weeks.  But this OTC. 4)  Do not drink alcohol while taking these meds. 5)  You are scheduled for an ultrasound. 6)  The medication list was reviewed and reconciled.  All changed / newly prescribed medications were explained.  A complete medication list was provided to the patient / caregiver. 7)  Copy sent to : Dr. Beverely Low 8)  Please continue current medications.  9)  Continued complete alcohol abstinence---inpatient alcohol therapy recommended.  Appended Document: weight loss, hepc, cirrhosis/lk    Clinical Lists  Changes  Medications: Added new medication of  METRONIDAZOLE 250 MG TABS (METRONIDAZOLE) three times a day - Signed Added new medication of FLORASTOR 250 MG CAPS (SACCHAROMYCES BOULARDII) daily x 10 days Rx of METRONIDAZOLE 250 MG TABS (METRONIDAZOLE) three times a day;  #30 x 0;  Signed;  Entered by: Ashok Cordia RN;  Authorized by: Mardella Layman MD [facility];  Method used: [facility]  [phone]*, [address], [place], [place]  [address], Ph: [phone] or [phone], Fax: [phone] Orders: Added new Test order of Ultrasound Abdomen (UAS) - Signed    Prescriptions: METRONIDAZOLE 250 MG TABS (METRONIDAZOLE) three times a day  #30 x 0   Entered by:   Ashok Cordia RN   Authorized by:   Mardella Layman MD [facility]   Signed by:   Ashok Cordia RN on 09/16/2009   Method used:   Electronically to        [facility]  [phone]* (retail)       [address] [place], [place]  [address]       Ph: [phone] or [phone]       Fax: [phone]   RxID:   [id number]

## 2010-06-13 NOTE — Letter (Signed)
Summary: [name]   Imported By: Sherian Rein 10/03/2009 10:45:14  _____________________________________________________________________  External Attachment:    Type:   Image     Comment:   External Document

## 2010-06-13 NOTE — Progress Notes (Signed)
Summary: REQ FOR RETURN CALL    Caller: Patient  [phone] Summary of Call: Pt called in to speak with Dr Kirtland Bouchard.... Pt adv that he has had a h/a x 3 days and it seems to be getting worse.... Pt would like to receive a return call to discuss same.... Pt can be reached at [phone].... Pt was offered OV but he advised he has no transportation.  Initial call taken by: Debbra Riding,  February 14, 2010 10:00 AM    New/Updated Medications: TRAMADOL HCL 50 MG TABS (TRAMADOL HCL) one every 6 hours for pain Prescriptions: TRAMADOL HCL 50 MG TABS (TRAMADOL HCL) one every 6 hours for pain  #50 x 0   Entered and Authorized by:   Gordy Savers  MD   Signed by:   Gordy Savers  MD on 02/14/2010   Method used:   Electronically to        [facility]  [phone]* (retail)       [address] [place], [place]  [address]       Ph: [phone] or [phone]       Fax: [phone]   RxID:   [id number]

## 2010-06-13 NOTE — Progress Notes (Signed)
Summary: Triage   Phone Note Call from Patient   Caller: Dad Summary of Call: Dr. Dawna Part, step father calling.  Pt will be leaving soon for a 28 day rehab center in [place].  Asking if pt is medically Ok to go for this now.  Dr. Dawna Part asking for results of [place] and labs.  he can be reached at [phone]. Initial call taken by: Ashok Cordia RN,  Sep 20, 2009 5:00 PM  Follow-up for Phone Call        labs look good...Marland Kitchenok for rehab...Marland KitchenMarland KitchenNIKE... Follow-up by: Mardella Layman MD FACG,  Sep 21, 2009 8:35 AM  Additional Follow-up for Phone Call Additional follow up Details #1::        Dr. Dawna Part notified.  Req that a copy of labs be faxed to him at [phone]. Additional Follow-up by: Ashok Cordia RN,  Sep 21, 2009 9:00 AM

## 2010-06-13 NOTE — Assessment & Plan Note (Signed)
Summary: follow up on illnesses per Dr. Marks/cjr   Vital Signs:  [name] profile:   55 year old male Weight:      142 pounds Temp:     98.0 degrees F oral BP sitting:   100 / 70  (right arm) Cuff size:   regular  Vitals Entered By: Duard Brady LPN (September 08, 2009 2:38 PM) CC: follow-up visit   CC:  follow-up visit.  History of Present Illness: Joshua [name] who is seen today for follow-up of his alcoholic cirrhosis.  He states that he is much improved.  He continues to have the anorexia and ongoing weight loss.  His nausea and vomiting have resolved.  He continues to have some intermittent loose stool associated occasional incontinence, but this also has improved.  In general, he feels stronger.  Laboratory testing was performed earlier that revealed significant hypokalemia.  He is now on potassium supplementation.  He remains abstinent from alcohol.  He was empirically placed on PPI therapy.  Last visit  Allergies (verified): No Known Drug Allergies  Past History:  Past Medical History: Reviewed history from 02/02/2009 and no changes required. Hepatitis C Hypertension  tobacco use chronic alcoholism umbilical  hernia glucose intolerance history depression weight loss cirrhosis  Review of Systems       The [name] complains of anorexia, weight loss, and muscle weakness.  The [name] denies fever, weight gain, vision loss, decreased hearing, hoarseness, chest pain, syncope, dyspnea on exertion, peripheral edema, prolonged cough, headaches, hemoptysis, abdominal pain, melena, hematochezia, severe indigestion/heartburn, hematuria, incontinence, genital sores, suspicious skin lesions, transient blindness, difficulty walking, depression, unusual weight change, abnormal bleeding, enlarged lymph nodes, angioedema, breast masses, and testicular masses.    Physical Exam  General:  appears chronically ill, but improved.  Blood pressure low-normal Head:  Normocephalic  and atraumatic without obvious abnormalities. No apparent alopecia or balding. Eyes:  anicteric Mouth:  Oral mucosa and oropharynx without lesions or exudates.  Teeth in good repair. Neck:  No deformities, masses, or tenderness noted. Lungs:  Normal respiratory effort, chest expands symmetrically. Lungs are clear to auscultation, no crackles or wheezes. Heart:  Normal rate and regular rhythm. S1 and S2 normal without gallop, murmur, click, rub or other extra sounds. Abdomen:  liver palpable 4 finger breaths below the right costal margin.  No longer tender Skin:  scattered spider angiomata   Impression & Recommendations:  Problem # 1:  CIRRHOSIS, ALCOHOLIC (ICD-571.2)  Orders: Venipuncture (44315) TLB-BMP (Basic Metabolic Panel-BMET) (80048-METABOL) TLB-CBC Platelet - w/Differential (85025-CBCD)  Problem # 2:  WEIGHT LOSS, ABNORMAL (ICD-783.21)  Orders: Venipuncture (40086) TLB-BMP (Basic Metabolic Panel-BMET) (80048-METABOL) TLB-CBC Platelet - w/Differential (85025-CBCD)  Complete Medication List: 1)  Trazodone Hcl 50 Mg Tabs (Trazodone hcl) .... 2 at bedtime 2)  Citalopram Hydrobromide 20 Mg Tabs (Citalopram hydrobromide) .... One daily 3)  Thiamine Hcl 100 Mg Tabs (Thiamine hcl) .... One daily 4)  Hydrocodone-acetaminophen 5-500 Mg Tabs (Hydrocodone-acetaminophen) .... One every 6 hours as needed for pain 5)  Klor-con M20 20 Meq Cr-tabs (Potassium chloride crys cr) .... 2 by mouth two times a day for 5 days then 1 by mouth bid 6)  Dexilant 60 Mg Cpdr (Dexlansoprazole) .... One capsule daily 7)  Triamcinolone Acetonide 0.1 % Crea (Triamcinolone acetonide) .... Use twice daily  [name] Instructions: 1)  Please schedule a follow-up appointment in 3 months. 2)  Limit your Sodium (Salt). 3)  GI referral as scheduled Prescriptions: TRIAMCINOLONE ACETONIDE 0.1 % CREA (TRIAMCINOLONE ACETONIDE) use twice daily  #  60 gm x 3   Entered and Authorized by:   Gordy Savers  MD    Signed by:   Gordy Savers  MD on 09/08/2009   Method used:   [facility]ronically to        [facility]  [phone]* (retail)       [address] [place], [place]  [address]       Ph: [phone] or [phone]       Fax: [phone]   RxID:   [id number]

## 2010-06-13 NOTE — Consult Note (Signed)
Summary: [facility]  [facility]   Imported By: Maryln Gottron 08/03/2009 13:14:53  _____________________________________________________________________  External Attachment:    Type:   Image     Comment:   External Document

## 2010-06-13 NOTE — Progress Notes (Signed)
Summary: refill  Phone Note Call from Patient   Caller: Patient Call For: Joshua Savers  MD Summary of Call: Pt. is asking for a refill on Hydrocodone for testicle pain. [facility] [phone] Initial call taken by: Lynann Beaver CMA,  July 27, 2009 8:45 AM  Follow-up for Phone Call        generic vicodin 5-500  #50 Follow-up by: Joshua Savers  MD,  July 27, 2009 10:32 AM    Prescriptions: HYDROCODONE-ACETAMINOPHEN 5-500 MG TABS (HYDROCODONE-ACETAMINOPHEN) one every 6 hours as needed for pain  #50 x 0   Entered by:   Lynann Beaver CMA   Authorized by:   Joshua Savers  MD   Signed by:   Lynann Beaver CMA on 07/27/2009   Method used:   Telephoned to ...       [facility]  [phone]* (retail)       [address] [place], [place]  [address]       Ph: [phone] or [phone]       Fax: [phone]   RxID:   [id number]

## 2010-06-13 NOTE — Progress Notes (Signed)
Summary: klor con rx   Phone Note From Other Clinic   Caller: lab Request: Talk with Nurse Summary of Call: critical lab called [facility] 2.8 - information given to Dr. Amador Cunas - orders taken for klor con.  Pt made aware to start medication tonight without fail - [facility].  Dr. Dawna Part called too. KIK Initial call taken by: Duard Brady LPN,  August 30, 2009 5:23 PM  Follow-up for Phone Call        [facility] Follow-up by: Duard Brady LPN,  August 30, 2009 5:27 PM

## 2010-06-13 NOTE — Letter (Signed)
Summary: [facility]  [facility]   Imported By: Sherian Rein 10/03/2009 10:39:38  _____________________________________________________________________  External Attachment:    Type:   Image     Comment:   External Document

## 2010-06-13 NOTE — Letter (Signed)
Summary: [facility]  [facility]   Imported By: Maryln Gottron 08/17/2009 10:34:00  _____________________________________________________________________  External Attachment:    Type:   Image     Comment:   External Document

## 2010-06-13 NOTE — Assessment & Plan Note (Signed)
Summary: PT COMING IN WITH DR MARKS // RS   Vital Signs:  [name] profile:   55 year old male Weight:      145 pounds Temp:     97.7 degrees F oral BP sitting:   100 / 60  (right arm) Cuff size:   regular  Vitals Entered By: Duard Brady LPN (August 30, 2009 12:53 PM) CC: c/o stomach/GI cramps Is [name] Diabetic? No   CC:  c/o stomach/GI cramps.  History of Present Illness: Joshua [name] who is seen today with a chief complaint of progressive weakness.  the [name] was last seen here on March 4, and over this interval.  Has lost 21 pounds in weight.  The [name] sustained considerable trauma to the scrotal area and has been in considerable pain and he feels this has affected his appetite.  He has subsequently  had a left orchiectomy and had considerable a postop discomfort as well.  Complaints include anorexia, nausea, vomiting, early satiety.  History is somewhat difficult to obtain, but he describes bowel habits as hard small-volume stool but associated with diarrhea.  It sounds like diarrhea is the prominent complaint and he states that he has this to 3 times per night and 10 to 12 times per 24 hour period.  He also described crampy abdominal pain. In September of last year, an abdominal ultrasound was obtained for surveillance due to his chronic hepatitis C.  This revealed gallstones and evidence of hepatocellular disease.  He does have a history of chronic alcoholism, but states that he has been abstinent since Super Bowl 2011.  there was some question about an abnormal renal lesion and subsequent abdominal MRI was also obtained at  that time. The [name] has treated hypertension, but recently has been off medication; he has a history of chronic anxiety.  A chest x-ray was also done last fall and a colonoscopy was performed in December of 2009.  Preventive Screening-Counseling & Management  Alcohol-Tobacco     Smoking Status: current  Allergies (verified): No Known Drug  Allergies  Past History:  Past Medical History: Reviewed history from 02/02/2009 and no changes required. 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  The [name] denies fever, weight gain, vision loss, decreased hearing, hoarseness, chest pain, syncope, peripheral edema, prolonged cough, headaches, hemoptysis, melena, hematochezia, severe indigestion/heartburn, hematuria, genital sores, suspicious skin lesions, transient blindness, abnormal bleeding, enlarged lymph nodes, angioedema, breast masses, and testicular masses.    Physical Exam  General:  cachectic appears chronically ill; blood pressure low-normal Head:  Normocephalic and atraumatic without obvious abnormalities. No apparent alopecia or balding. Eyes:  No corneal or conjunctival inflammation noted. EOMI. Perrla. Funduscopic exam benign, without hemorrhages, exudates or papilledema. Vision grossly normal. Ears:  External ear exam shows no significant lesions or deformities.  Otoscopic examination reveals clear canals, tympanic membranes are intact bilaterally without bulging, retraction, inflammation or discharge. Hearing is  grossly normal bilaterally. Mouth:  Oral mucosa and oropharynx without lesions or exudates.   Neck:  No deformities, masses, or tenderness  noted. Lungs:  Normal respiratory effort, chest expands symmetrically. Lungs are clear to auscultation, no crackles or wheezes. Heart:  Normal rate and regular rhythm. S1 and S2 normal without gallop, murmur, click, rub or other extra sounds. Abdomen:  tender hepatomegaly Genitalia:  status post left orchiectomy with scrotal packing in place Msk:  No deformity or scoliosis noted of thoracic or lumbar spine.   Pulses:  R and L carotid,radial,femoral,dorsalis pedis and posterior tibial pulses are full and equal bilaterally Extremities:  muscle wasting Skin:  scattered spider angiomata Cervical Nodes:  No lymphadenopathy noted Axillary Nodes:  No palpable lymphadenopathy Inguinal Nodes:  No significant adenopathy Psych:  flat affect.     Impression & Recommendations:  Problem # 1:  ABDOMINAL PAIN, GENERALIZED (ICD-789.07)  Orders: Venipuncture (33295) TLB-BMP (Basic Metabolic Panel-BMET) (80048-METABOL) TLB-CBC Platelet - w/Differential (85025-CBCD) TLB-TSH (Thyroid Stimulating Hormone) (84443-TSH) TLB-Hepatic/Liver Function Pnl (80076-HEPATIC)  Problem # 2:  WEIGHT LOSS, ABNORMAL (ICD-783.21)  Orders: Venipuncture (18841) Gastroenterology Referral (GI) TLB-BMP (Basic Metabolic Panel-BMET) (80048-METABOL) TLB-CBC Platelet - w/Differential (85025-CBCD) TLB-TSH (Thyroid Stimulating Hormone) (84443-TSH) TLB-Hepatic/Liver Function Pnl (80076-HEPATIC) will check screening labs.  Will consider hospital admission if serious dehydration present; Will treat symptomatically with anti-emetics and empirically place on proton pump inhibition.    Problem # 3:  HYPERTENSION (ICD-401.9)  Orders: Venipuncture (66063) TLB-BMP (Basic Metabolic Panel-BMET) (80048-METABOL) TLB-CBC Platelet - w/Differential (85025-CBCD) TLB-TSH (Thyroid Stimulating Hormone) (84443-TSH) TLB-Hepatic/Liver Function Pnl (80076-HEPATIC)  Problem # 4:  HEPATITIS C (ICD-070.51)  Orders: Venipuncture  (01601) Gastroenterology Referral (GI) TLB-BMP (Basic Metabolic Panel-BMET) (80048-METABOL) TLB-CBC Platelet - w/Differential (85025-CBCD) TLB-TSH (Thyroid Stimulating Hormone) (84443-TSH) TLB-Hepatic/Liver Function Pnl (80076-HEPATIC)  Problem # 5:  CIRRHOSIS, ALCOHOLIC (ICD-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  Will schedule for upper panendoscopy  Complete Medication List: 1)  Trazodone Hcl 50 Mg Tabs (Trazodone hcl) .... 2 at bedtime 2)  Citalopram Hydrobromide 20 Mg Tabs (Citalopram hydrobromide) .... One daily 3)  Thiamine Hcl 100 Mg Tabs (Thiamine hcl) .... One daily 4)  Hydrocodone-acetaminophen 5-500 Mg Tabs (Hydrocodone-acetaminophen) .... One every 6 hours as needed for pain 5)  Klor-con M20 20 Meq Cr-tabs (Potassium chloride crys cr) .... 2 by mouth two times a day for 5 days then 1 by mouth bid 6)  Dexilant 60 Mg Cpdr (Dexlansoprazole) .... One capsule daily  [name] Instructions: 1)  Please schedule a follow-up appointment in 2 weeks. 2)  take medications as directed 3)  GI follow-up as scheduled 4)  call the office for hospital admission tomorrow if unimproved 5)  avoid all alcohol use 6)  Drink clear liquids only for the next 24 hours, then slowly add other liquids and food as you  tolerate them. 7)  do  not use any blood pressure medication 8)  continue thiamine daily Prescriptions: KLOR-CON M20 20 MEQ CR-TABS (POTASSIUM CHLORIDE CRYS CR) 2 by mouth two times a day for 5 days then 1 by mouth bid  #200 x 1   Entered and Authorized by:   Gordy Savers  MD   Signed by:   Gordy Savers  MD on 09/01/2009   Method used:   Historical   RxID:   [id number]

## 2010-06-13 NOTE — Letter (Signed)
Summary: [facility]  [facility]   Imported By: Sherian Rein 10/03/2009 10:40:25  _____________________________________________________________________  External Attachment:    Type:   Image     Comment:   External Document

## 2010-06-13 NOTE — Letter (Signed)
Summary: New Patient letter  [facility]  [address] [place], [place] [address]   Phone: [phone]  Fax: [phone]       09/08/2009 MRN: [id number]  Joshua [name] [address] [place], [place]  [address]  Dear Joshua [name],  Welcome to the Gastroenterology Division at [facility].    You are scheduled to see Dr.  Jarold Motto on 09-16-2009 at 8:45am on the 3rd floor at [facility], [address]  We ask that you try to arrive at our office 15 minutes prior to your appointment time to allow for check-in.  We would like you to complete the enclosed self-administered evaluation form prior to your visit and bring it with you on the day of your appointment.  We will review it with you.  Also, please bring a complete list of all your medications or, if you prefer, bring the medication bottles and we will list them.  Please bring your insurance card so that we may make a copy of it.  If your insurance requires a referral to see a specialist, please bring your referral form from your primary care physician.  Co-payments are due at the time of your visit and may be paid by cash, check or credit card.     Your office visit will consist of a consult with your physician (includes a physical exam), any laboratory testing he/she may order, scheduling of any necessary diagnostic testing (e.g. x-ray, ultrasound, CT-scan), and scheduling of a procedure (e.g. Endoscopy, Colonoscopy) if required.  Please allow enough time on your schedule to allow for any/all of these possibilities.    If you cannot keep your appointment, please call [phone] to cancel or reschedule prior to your appointment date.  This allows [place] the opportunity to schedule an appointment for another patient in need of care.  If you do not cancel or reschedule by 5 p.m. the business day prior to your appointment date, you will be charged a $50.00 late cancellation/no-show fee.    Thank you for choosing [facility] for your medical needs.  We appreciate the opportunity to care for you.  Please visit [place] at our website  to learn more about our practice.                     Sincerely,                                                             The Gastroenterology Division

## 2010-06-13 NOTE — Letter (Signed)
Summary: [facility]  [facility]   Imported By: Sherian Rein 10/03/2009 10:38:45  _____________________________________________________________________  External Attachment:    Type:   Image     Comment:   External Document

## 2010-06-13 NOTE — Letter (Signed)
Summary: [facility]  [facility]   Imported By: Sherian Rein 10/03/2009 10:36:47  _____________________________________________________________________  External Attachment:    Type:   Image     Comment:   External Document

## 2010-06-13 NOTE — Letter (Signed)
Summary: [facility]  [facility]   Imported By: Sherian Rein 10/03/2009 10:35:56  _____________________________________________________________________  External Attachment:    Type:   Image     Comment:   External Document

## 2010-06-13 NOTE — Letter (Signed)
Summary: [facility]  [facility]   Imported By: Maryln Gottron 09/28/2009 13:06:52  _____________________________________________________________________  External Attachment:    Type:   Image     Comment:   External Document

## 2010-06-15 NOTE — Assessment & Plan Note (Signed)
Summary: per dr. Donney Dice  And and a  Vital Signs:  Patient profile:   55 year old male Weight:      161 pounds Temp:     97.7 degrees F oral BP sitting:   120 / 74  (left arm) Cuff size:   regular  Vitals Entered By: Duard Brady LPN (May 25, 2010 12:40 PM) Is Patient Diabetic? No   Primary Care Provider:  Gordy Savers  MD   History of Present Illness: 55 -year-old patient who is seen today for follow-up.  He has a history of chronic alcoholism and states that he drinks sporadically.  His last rehab admission was in May of last year.  He has a history of depression, but has self discontinued citalopram.  He describes an increase in depression, irritability, over the past two months.  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  He has a history of hypertension, which has been controlled without medication for some time.  He has chronic hepatitis C.  An ultrasound was performed, last year that revealed hepato- megaly, and no evidence of hepatoma.  He has chronic fatigue.  He continues to smoke  Allergies (verified): No Known Drug Allergies  Past History:  Past Medical History: Hepatitis C Hypertension  tobacco use chronic alcoholism umbilical  hernia glucose intolerance history depression weight loss cirrhosis gallstones  Past Surgical History: Cataract extraction Inguinal herniorrhaphy umbilical hernia repair colonoscopy 12/09  Left orchiectomy March 2011  Family History: Reviewed history from 07/08/2007 and no changes required. 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  The patient denies anorexia, fever, weight loss, weight gain, vision loss, decreased hearing, hoarseness, chest pain, syncope, dyspnea on exertion, peripheral edema, prolonged cough, headaches, hemoptysis, abdominal pain, melena, hematochezia, severe indigestion/heartburn, hematuria, incontinence, genital sores, muscle weakness, suspicious skin lesions, transient blindness, difficulty walking, unusual weight change, abnormal bleeding, enlarged lymph nodes, angioedema, breast masses, and testicular masses.    Physical Exam  General:  appears chronically ill and older than stated age.  Blood pressure 120/74 Head:  Normocephalic and atraumatic without obvious abnormalities. No apparent alopecia or balding. Eyes:  No corneal or conjunctival inflammation noted. EOMI. Perrla. Funduscopic exam benign, without hemorrhages, exudates or papilledema. Vision grossly normal. Ears:  External ear exam shows no significant lesions or deformities.  Otoscopic examination reveals clear canals, tympanic membranes are intact bilaterally without bulging, retraction, inflammation or discharge. Hearing is grossly normal bilaterally. Mouth:  Oral mucosa and oropharynx without lesions or exudates.   Neck:  No deformities, masses, or tenderness noted. Breasts:  mild gynecomastia Lungs:  Normal respiratory effort, chest expands symmetrically. Lungs are clear to auscultation, no crackles or wheezes. Heart:  Normal rate and regular rhythm. S1 and S2 normal without gallop, murmur, click, rub or other extra sounds. Abdomen:  prominent hepatomegaly Genitalia:  left orchiectomy Msk:  No deformity or scoliosis noted of thoracic or lumbar spine.   Pulses:  R and L carotid,radial,femoral,dorsalis pedis and posterior tibial pulses are full and equal bilaterally Extremities:  No  clubbing, cyanosis, edema, or deformity noted with normal full range of motion of all joints.   Skin:   scattered spider angiomata Cervical Nodes:  No lymphadenopathy noted Psych:  dysphoric affect.  dysphoric affect.     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 2 at bedtime 2)  Thiamine Hcl 100 Mg Tabs (Thiamine hcl) .... One daily 3)  Klor-con M20 20 Meq Cr-tabs (Potassium chloride crys cr) .... 2 by mouth two times a day for 5 days then 1 by mouth bid 4)  Tramadol Hcl 50 Mg Tabs (Tramadol hcl) .... One every 6 hours for pain 5)  Sertraline Hcl 50 Mg Tabs (Sertraline hcl) .... One daily 6)  Multivitamins Caps (Multiple vitamin) .... One daily  Other Orders: Venipuncture (16109) TLB-BMP (Basic Metabolic Panel-BMET) (80048-METABOL) TLB-CBC Platelet - w/Differential (85025-CBCD) TLB-Hepatic/Liver Function Pnl (80076-HEPATIC) TLB-TSH (Thyroid Stimulating Hormone) (84443-TSH) Specimen Handling (60454)  Patient Instructions: 1)  Please schedule a follow-up appointment in 2 months. 2)  Limit your Sodium (Salt) to less than 2 grams a day(slightly less than 1/2 a teaspoon) to prevent fluid retention, swelling, or worsening of symptoms. 3)  It is important that you exercise regularly at least 20 minutes 5 times a week. If you develop chest pain, have severe difficulty breathing, or feel very tired , stop exercising immediately and seek medical attention. 4)  discontinue all alcohol 5)  follow up aa 6)   get a sponsor Prescriptions: SERTRALINE HCL 50 MG TABS (SERTRALINE HCL) one daily  #90 x 3   Entered and Authorized by:   Gordy Savers  MD   Signed by:   Gordy Savers  MD on 05/25/2010   Method used:   Electronically to        [facility]  [phone]* (retail)       [address] [place], [place]  [address]       Ph: [phone] or [phone]       Fax: [phone]   RxID:   [id number] TRAMADOL HCL 50 MG TABS (TRAMADOL HCL) one every 6 hours  for pain  #50 Tablet x 2   Entered and Authorized by:   Gordy Savers  MD   Signed by:   Gordy Savers  MD on 05/25/2010   Method used:   Electronically to        [facility]  [phone]* (retail)       [address] [place], [place]  [address]       Ph: [phone] or [phone]       Fax: [phone]   RxID:   [id number] THIAMINE HCL 100 MG TABS (THIAMINE HCL) one daily  #100 x 6   Entered and Authorized by:   Gordy Savers  MD   Signed by:   Gordy Savers  MD on 05/25/2010   Method used:   Electronically to        [facility]  [phone]* (retail)       [address] [place], [place]  [address]       Ph: [phone] or [phone]       Fax: [phone]   RxID:   [id number] TRAZODONE HCL 50 MG  TABS (TRAZODONE HCL) 2 at bedtime  #180 x 4  Entered and Authorized by:   Gordy Savers  MD   Signed by:   Gordy Savers  MD on 05/25/2010   Method used:   Electronically to        [facility]  [phone]* (retail)       [address] [place], [place]  [address]       Ph: [phone] or [phone]       Fax: [phone]   RxID:   [id number]    Orders Added: 1)  Est. Patient Level III [36644] 2)  Venipuncture [03474] 3)  TLB-BMP (Basic Metabolic Panel-BMET) [80048-METABOL] 4)  TLB-CBC Platelet - w/Differential [85025-CBCD] 5)  TLB-Hepatic/Liver Function Pnl [80076-HEPATIC] 6)  TLB-TSH (Thyroid Stimulating Hormone) [84443-TSH] 7)  Specimen Handling [99000]

## 2010-06-16 NOTE — Letter (Signed)
Summary: [facility]  [facility]   Imported By: Sherian Rein 10/03/2009 10:34:57  _____________________________________________________________________  External Attachment:    Type:   Image     Comment:   External Document

## 2010-08-07 LAB — ANAEROBIC CULTURE

## 2010-08-07 LAB — WOUND CULTURE

## 2011-05-02 ENCOUNTER — Telehealth: Payer: Self-pay | Admitting: Internal Medicine

## 2011-05-02 NOTE — Telephone Encounter (Signed)
ok 

## 2011-05-02 NOTE — Telephone Encounter (Signed)
Spoke with Dr. Dawna Part - son has periods of severe depression ,meds dont seem to help - needs to be evaled by some on - He has spoke with [place] BOnd in our office and she has agreed that he needs to be seen and would like to see him and the family .  Attempt to call Victorino Dike to give info - VM - left Name , reason and dr. Consepcion Hearing phone # as a contact.

## 2011-05-02 NOTE — Telephone Encounter (Signed)
Wants this per son referred to Judithe Modest. Please return his call. He stated that he left 2 voicemail messages. Please advise. Thanks.

## 2011-05-23 ENCOUNTER — Ambulatory Visit (INDEPENDENT_AMBULATORY_CARE_PROVIDER_SITE_OTHER): Payer: BC Managed Care – PPO | Admitting: Licensed Clinical Social Worker

## 2011-05-23 DIAGNOSIS — F331 Major depressive disorder, recurrent, moderate: Secondary | ICD-10-CM

## 2011-05-24 ENCOUNTER — Ambulatory Visit: Payer: Self-pay | Admitting: Licensed Clinical Social Worker

## 2011-05-28 ENCOUNTER — Ambulatory Visit: Payer: Self-pay | Admitting: Licensed Clinical Social Worker

## 2011-05-30 ENCOUNTER — Ambulatory Visit (INDEPENDENT_AMBULATORY_CARE_PROVIDER_SITE_OTHER): Payer: BC Managed Care – PPO | Admitting: Licensed Clinical Social Worker

## 2011-05-30 DIAGNOSIS — F331 Major depressive disorder, recurrent, moderate: Secondary | ICD-10-CM

## 2011-06-06 ENCOUNTER — Ambulatory Visit (INDEPENDENT_AMBULATORY_CARE_PROVIDER_SITE_OTHER): Payer: BC Managed Care – PPO | Admitting: Licensed Clinical Social Worker

## 2011-06-06 DIAGNOSIS — F331 Major depressive disorder, recurrent, moderate: Secondary | ICD-10-CM

## 2011-06-14 ENCOUNTER — Ambulatory Visit (INDEPENDENT_AMBULATORY_CARE_PROVIDER_SITE_OTHER): Payer: BC Managed Care – PPO | Admitting: Licensed Clinical Social Worker

## 2011-06-14 DIAGNOSIS — F331 Major depressive disorder, recurrent, moderate: Secondary | ICD-10-CM

## 2011-06-18 ENCOUNTER — Encounter: Payer: Self-pay | Admitting: Internal Medicine

## 2011-06-18 ENCOUNTER — Ambulatory Visit (INDEPENDENT_AMBULATORY_CARE_PROVIDER_SITE_OTHER): Payer: BC Managed Care – PPO | Admitting: Internal Medicine

## 2011-06-18 DIAGNOSIS — Z0000 Encounter for general adult medical examination without abnormal findings: Secondary | ICD-10-CM

## 2011-06-18 DIAGNOSIS — I10 Essential (primary) hypertension: Secondary | ICD-10-CM

## 2011-06-18 DIAGNOSIS — R160 Hepatomegaly, not elsewhere classified: Secondary | ICD-10-CM

## 2011-06-18 DIAGNOSIS — B1710 Acute hepatitis C without hepatic coma: Secondary | ICD-10-CM

## 2011-06-18 DIAGNOSIS — F411 Generalized anxiety disorder: Secondary | ICD-10-CM

## 2011-06-18 LAB — COMPREHENSIVE METABOLIC PANEL
AST: 79 U/L — ABNORMAL HIGH (ref 0–37)
Albumin: 3.6 g/dL (ref 3.5–5.2)
Alkaline Phosphatase: 154 U/L — ABNORMAL HIGH (ref 39–117)
BUN: 15 mg/dL (ref 6–23)
Potassium: 4.5 mEq/L (ref 3.5–5.1)
Sodium: 138 mEq/L (ref 135–145)
Total Bilirubin: 0.6 mg/dL (ref 0.3–1.2)
Total Protein: 6.9 g/dL (ref 6.0–8.3)

## 2011-06-18 LAB — CBC WITH DIFFERENTIAL/PLATELET
Eosinophils Relative: 3.9 % (ref 0.0–5.0)
HCT: 46.4 % (ref 39.0–52.0)
Hemoglobin: 16 g/dL (ref 13.0–17.0)
Lymphs Abs: 2.8 10*3/uL (ref 0.7–4.0)
MCV: 100.7 fl — ABNORMAL HIGH (ref 78.0–100.0)
Monocytes Absolute: 1.4 10*3/uL — ABNORMAL HIGH (ref 0.1–1.0)
Monocytes Relative: 13.9 % — ABNORMAL HIGH (ref 3.0–12.0)
Neutro Abs: 5.3 10*3/uL (ref 1.4–7.7)
Platelets: 202 10*3/uL (ref 150.0–400.0)
WBC: 9.9 10*3/uL (ref 4.5–10.5)

## 2011-06-18 LAB — PROTIME-INR: Prothrombin Time: 10.3 s (ref 10.2–12.4)

## 2011-06-18 LAB — PSA: PSA: 2.39 ng/mL (ref 0.10–4.00)

## 2011-06-18 LAB — LIPID PANEL
HDL: 69.1 mg/dL (ref >39.00)
LDL Cholesterol: 104 mg/dL — ABNORMAL HIGH (ref 0–99)
VLDL: 8.4 mg/dL (ref 0.0–40.0)

## 2011-06-18 MED ORDER — TRAMADOL HCL 50 MG PO TABS: 50.0000 mg | ORAL_TABLET | Freq: Four times a day (QID) | ORAL | Status: DC | PRN

## 2011-06-18 MED ORDER — TRAZODONE HCL 50 MG PO TABS: 100.0000 mg | ORAL_TABLET | Freq: Every day | ORAL | Status: DC

## 2011-06-18 MED ORDER — SERTRALINE HCL 100 MG PO TABS: 100.0000 mg | ORAL_TABLET | Freq: Every day | ORAL | Status: DC

## 2011-06-18 NOTE — Progress Notes (Signed)
Subjective:    Patient ID: Joshua [name], male    DOB: May 17, 1955, 56 y.o.   MRN: [id number]  HPI  56 year old patient who is seen today for followup and an annual health assessment.  He is being followed by behavioral health Joshua [name]) and has done quite well. He has a history of anxiety and depression. He has done quite well with CBT and sertraline. He feels that he may benefit from a higher dose. He has a history of chronic alcoholism. He states that presently he is abstinent but did have a relapse in August. He has COPD and ongoing tobacco use. He has a history of chronic hepatitis C and chronic hepatomegaly He has a remote history of hypertension presently being treated off medication  Past Medical History  Diagnosis Date  . Hepatitis C   . Hypertension   . Tobacco use   . Chronic alcoholism   . Umbilical hernia   . Glucose intolerance (impaired glucose tolerance)   . Depression   . Weight loss   . Cirrhosis   . Gallstones     History   Social History  . Marital Status: Divorced    Spouse Name: N/A    Number of Children: N/A  . Years of Education: N/A   Occupational History  . Not on file.   Social History Main Topics  . Smoking status: Current Everyday Smoker -- 0.5 packs/day    Types: Cigarettes  . Smokeless tobacco: Never Used  . Alcohol Use: No  . Drug Use: Not on file  . Sexually Active: Not on file   Other Topics Concern  . Not on file   Social History Narrative  . No narrative on file    Past Surgical History  Procedure Date  . Cataract extraction   . Inguinal hernia repair   . Umbilical hernia repair   . Colonoscopy 12/09  . Left orchiectomy 07/2009    Family History  Problem Relation Age of Onset  . Healthy Mother     No Known Allergies  No current outpatient prescriptions on file prior to visit.    BP 132/88  Pulse 115  Temp(Src) 98.7 F (37.1 C) (Oral)  Wt 177 lb (80.287 kg)  SpO2 94%       Review of Systems    Constitutional: Negative for fever, chills, appetite change and fatigue.  HENT: Negative for hearing loss, ear pain, congestion, sore throat, trouble swallowing, neck stiffness, dental problem, voice change and tinnitus.   Eyes: Negative for pain, discharge and visual disturbance.  Respiratory: Negative for cough, chest tightness, wheezing and stridor.   Cardiovascular: Negative for chest pain, palpitations and leg swelling.  Gastrointestinal: Negative for nausea, vomiting, abdominal pain, diarrhea, constipation, blood in stool and abdominal distention.  Genitourinary: Negative for urgency, hematuria, flank pain, discharge, difficulty urinating and genital sores.  Musculoskeletal: Negative for myalgias, back pain, joint swelling, arthralgias and gait problem.  Skin: Negative for rash.  Neurological: Negative for dizziness, syncope, speech difficulty, weakness, numbness and headaches.  Hematological: Negative for adenopathy. Does not bruise/bleed easily.  Psychiatric/Behavioral: Positive for sleep disturbance. Negative for behavioral problems and dysphoric mood. The patient is nervous/anxious.        Objective:   Physical Exam  Constitutional: He is oriented to person, place, and time. He appears well-developed.  HENT:  Head: Normocephalic.  Right Ear: External ear normal.  Left Ear: External ear normal.  Eyes: Conjunctivae and EOM are normal.  Neck: Normal range of motion.  Cardiovascular: Normal rate and normal heart sounds.   Pulmonary/Chest: Breath sounds normal.  Abdominal: Soft. Bowel sounds are normal.       Prominent hepatomegaly  Musculoskeletal: Normal range of motion. He exhibits no edema and no tenderness.  Neurological: He is alert and oriented to person, place, and time.  Skin:       Scattered spider angiomata anterior chest;  ecchymoses of the arms  Digital clubbing  Psychiatric: He has a normal mood and affect. His behavior is normal.          Assessment &  Plan:   Preventive health examination. We'll obtain some updated labs. Sertraline will be increased to 100 mg. Other medications unchanged Anxiety -depression stable. Increase sertraline Hypertension blood pressure high normal off medications. We'll continue to observe Chronic alcoholism Chronic hepatitis C. LFTs reviewed

## 2011-06-18 NOTE — Patient Instructions (Signed)
Limit your sodium (Salt) intake  Smoking tobacco is very bad for your health. You should stop smoking immediately.    It is important that you exercise regularly, at least 20 minutes 3 to 4 times per week.  If you develop chest pain or shortness of breath seek  medical attention.  Return in 6 months for follow-up  

## 2011-06-27 ENCOUNTER — Ambulatory Visit (INDEPENDENT_AMBULATORY_CARE_PROVIDER_SITE_OTHER): Payer: BC Managed Care – PPO | Admitting: Licensed Clinical Social Worker

## 2011-06-27 DIAGNOSIS — F331 Major depressive disorder, recurrent, moderate: Secondary | ICD-10-CM

## 2011-07-11 ENCOUNTER — Ambulatory Visit (INDEPENDENT_AMBULATORY_CARE_PROVIDER_SITE_OTHER): Payer: BC Managed Care – PPO | Admitting: Licensed Clinical Social Worker

## 2011-07-11 DIAGNOSIS — F331 Major depressive disorder, recurrent, moderate: Secondary | ICD-10-CM

## 2011-07-12 ENCOUNTER — Encounter: Payer: Self-pay | Admitting: Gastroenterology

## 2011-07-25 ENCOUNTER — Ambulatory Visit: Payer: BC Managed Care – PPO | Admitting: Licensed Clinical Social Worker

## 2011-08-02 ENCOUNTER — Ambulatory Visit: Payer: BC Managed Care – PPO | Admitting: Licensed Clinical Social Worker

## 2011-09-04 ENCOUNTER — Encounter: Payer: Self-pay | Admitting: Gastroenterology

## 2011-10-01 ENCOUNTER — Encounter: Payer: Self-pay | Admitting: Gastroenterology

## 2011-10-01 ENCOUNTER — Ambulatory Visit (AMBULATORY_SURGERY_CENTER): Payer: BC Managed Care – PPO | Admitting: *Deleted

## 2011-10-01 VITALS — Ht 71.0 in | Wt 165.9 lb

## 2011-10-01 DIAGNOSIS — Z1211 Encounter for screening for malignant neoplasm of colon: Secondary | ICD-10-CM

## 2011-10-01 MED ORDER — PEG-KCL-NACL-NASULF-NA ASC-C 100 G PO SOLR: ORAL | Status: DC

## 2011-10-15 ENCOUNTER — Ambulatory Visit (AMBULATORY_SURGERY_CENTER): Payer: BC Managed Care – PPO | Admitting: Gastroenterology

## 2011-10-15 ENCOUNTER — Encounter: Payer: Self-pay | Admitting: Gastroenterology

## 2011-10-15 VITALS — BP 130/82 | HR 66 | Temp 97.4°F | Resp 17 | Ht 71.0 in | Wt 165.0 lb

## 2011-10-15 DIAGNOSIS — D126 Benign neoplasm of colon, unspecified: Secondary | ICD-10-CM

## 2011-10-15 DIAGNOSIS — Z1211 Encounter for screening for malignant neoplasm of colon: Secondary | ICD-10-CM

## 2011-10-15 DIAGNOSIS — Z86010 Personal history of colonic polyps: Secondary | ICD-10-CM

## 2011-10-15 DIAGNOSIS — K5730 Diverticulosis of large intestine without perforation or abscess without bleeding: Secondary | ICD-10-CM

## 2011-10-15 MED ORDER — SODIUM CHLORIDE 0.9 % IV SOLN: 500.0000 mL | INTRAVENOUS | Status: DC

## 2011-10-15 NOTE — Progress Notes (Signed)
Propofol per [facility] crna. See scanned crna intra procedure report. ewm 

## 2011-10-15 NOTE — Patient Instructions (Signed)
YOU HAD AN ENDOSCOPIC PROCEDURE TODAY AT THE [facility]: Refer to the procedure report that was given to you for any specific questions about what was found during the examination.  If the procedure report does not answer your questions, please call your gastroenterologist to clarify.  If you requested that your care partner not be given the details of your procedure findings, then the procedure report has been included in a sealed envelope for you to review at your convenience later.  YOU SHOULD EXPECT: Some feelings of bloating in the abdomen. Passage of more gas than usual.  Walking can help get rid of the air that was put into your GI tract during the procedure and reduce the bloating. If you had a lower endoscopy (such as a colonoscopy or flexible sigmoidoscopy) you may notice spotting of blood in your stool or on the toilet paper. If you underwent a bowel prep for your procedure, then you may not have a normal bowel movement for a few days.  DIET: Your first meal following the procedure should be a light meal and then it is ok to progress to your normal diet.  A half-sandwich or bowl of soup is an example of a good first meal.  Heavy or fried foods are harder to digest and may make you feel nauseous or bloated.  Likewise meals heavy in dairy and vegetables can cause extra gas to form and this can also increase the bloating.  Drink plenty of fluids but you should avoid alcoholic beverages for 24 hours.  ACTIVITY: Your care partner should take you home directly after the procedure.  You should plan to take it easy, moving slowly for the rest of the day.  You can resume normal activity the day after the procedure however you should NOT DRIVE or use heavy machinery for 24 hours (because of the sedation medicines used during the test).    SYMPTOMS TO REPORT IMMEDIATELY: A gastroenterologist can be reached at any hour.  During normal business hours, 8:30 AM to 5:00 PM Monday through Friday,  call [phone].  After hours and on weekends, please call the GI answering service at [phone] who will take a message and have the physician on call contact you.   Following lower endoscopy (colonoscopy or flexible sigmoidoscopy):  Excessive amounts of blood in the stool  Significant tenderness or worsening of abdominal pains  Swelling of the abdomen that is new, acute  Fever of 100F or higher    FOLLOW UP: If any biopsies were taken you will be contacted by phone or by letter within the next 1-3 weeks.  Call your gastroenterologist if you have not heard about the biopsies in 3 weeks.  Our staff will call the home number listed on your records the next business day following your procedure to check on you and address any questions or concerns that you may have at that time regarding the information given to you following your procedure. This is a courtesy call and so if there is no answer at the home number and we have not heard from you through the emergency physician on call, we will assume that you have returned to your regular daily activities without incident.  SIGNATURES/CONFIDENTIALITY: You and/or your care partner have signed paperwork which will be entered into your electronic medical record.  These signatures attest to the fact that that the information above on your After Visit Summary has been reviewed and is understood.  Full responsibility of the confidentiality   of this discharge information lies with you and/or your care-partner.    Information given to you on diverticulosis & polyps  & high fiber diet today

## 2011-10-15 NOTE — Progress Notes (Signed)
Patient did not experience any of the following events: a burn prior to discharge; a fall within the facility; wrong site/side/patient/procedure/implant event; or a hospital transfer or hospital admission upon discharge from the facility. (G8907) Patient did not have preoperative order for IV antibiotic SSI prophylaxis. (G8918)  

## 2011-10-15 NOTE — Op Note (Signed)
 [facility] [address] [place], [place]  [address]  COLONOSCOPY PROCEDURE REPORT  PATIENT:  Joshua [name], Joshua [name]  MR#:  [id number] BIRTHDATE:  December 04, 1955, 55 yrs. old  GENDER:  male ENDOSCOPIST:  Vania Rea. Jarold Motto, MD, [facility] REF. BY:  Eleonore Chiquito, M.D. PROCEDURE DATE:  10/15/2011 PROCEDURE:  Colonoscopy with snare polypectomy ASA CLASS:  Class III INDICATIONS:  history of pre-cancerous (adenomatous) colon polyps  MEDICATIONS:   propofol (Diprivan) 300 mg IV  DESCRIPTION OF PROCEDURE:   After the risks and benefits and of the procedure were explained, informed consent was obtained. Digital rectal exam was performed and revealed no abnormalities. The LB [id number]-[id number] endoscope was introduced through the anus and advanced to the cecum, which was identified by both the appendix and ileocecal valve.  The quality of the prep was good, using MoviPrep.  The instrument was then slowly withdrawn as the colon was fully examined. <<PROCEDUREIMAGES>>  FINDINGS:  There were mild diverticular changes in left colon. diverticulosis was found.  A sessile polyp was found in the cecum. flat linear 12mm polyp hot snare removed.   Retroflexed views in the rectum revealed no abnormalities.    The scope was then withdrawn from the patient and the procedure completed.  COMPLICATIONS:  None ENDOSCOPIC IMPRESSION: 1) Diverticulosis,mild,left sided diverticulosis 2) Sessile polyp in the cecum RECOMMENDATIONS: 1) Await biopsy results 2) Repeat colonoscopy in 5 years if polyp adenomatous; otherwise 10 years 3) High fiber diet.  REPEAT EXAM:  No  ______________________________ Vania Rea. Jarold Motto, MD, [facility]  CC:  n. eSIGNED:   Vania Rea. Thailan Sava at 10/15/2011 09:41 AM  Ponciano Ort, [phone]

## 2011-10-16 ENCOUNTER — Telehealth: Payer: Self-pay | Admitting: *Deleted

## 2011-10-16 NOTE — Telephone Encounter (Signed)
  Follow up Call-  Call back number 10/15/2011  Post procedure Call Back phone  # [phone]  Permission to leave phone message Yes     Patient questions:  Do you have a fever, pain , or abdominal swelling? no Pain Score  0 *  Have you tolerated food without any problems? yes  Have you been able to return to your normal activities? yes  Do you have any questions about your discharge instructions: Diet   no Medications  no Follow up visit  no  Do you have questions or concerns about your Care? no  Actions: * If pain score is 4 or above: No action needed, pain <4.

## 2011-10-19 ENCOUNTER — Encounter: Payer: Self-pay | Admitting: Gastroenterology

## 2011-12-04 ENCOUNTER — Emergency Department (HOSPITAL_COMMUNITY)
Admission: EM | Admit: 2011-12-04 | Discharge: 2011-12-04 | Disposition: A | Discharge status: Home / Self Care | Payer: BC Managed Care – PPO | Source: Home / Self Care | Attending: Emergency Medicine | Admitting: Emergency Medicine

## 2011-12-04 ENCOUNTER — Encounter (HOSPITAL_COMMUNITY): Payer: Self-pay

## 2011-12-04 DIAGNOSIS — S81809D Unspecified open wound, unspecified lower leg, subsequent encounter: Secondary | ICD-10-CM

## 2011-12-04 MED ORDER — CEPHALEXIN 500 MG PO CAPS: 500.0000 mg | ORAL_CAPSULE | Freq: Four times a day (QID) | ORAL | Status: AC
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
## 2011-12-04 NOTE — ED Provider Notes (Signed)
Medical screening examination/treatment/procedure(s) were performed by non-physician practitioner and as supervising physician I was immediately available for consultation/collaboration.  Leslee Home, M.D.   Reuben Likes, MD 12/04/11 [phone]
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
## 2011-12-04 NOTE — ED Notes (Signed)
Pt instructed to make appt with [facility]. Contact information provided to patient. One Rx given: Keflex. Rx reviewed with patient.

## 2011-12-10 ENCOUNTER — Encounter (HOSPITAL_BASED_OUTPATIENT_CLINIC_OR_DEPARTMENT_OTHER): Payer: BC Managed Care – PPO | Attending: Plastic Surgery

## 2011-12-10 DIAGNOSIS — Z79899 Other long term (current) drug therapy: Secondary | ICD-10-CM | POA: Insufficient documentation

## 2011-12-10 DIAGNOSIS — I10 Essential (primary) hypertension: Secondary | ICD-10-CM | POA: Insufficient documentation

## 2011-12-10 DIAGNOSIS — L97809 Non-pressure chronic ulcer of other part of unspecified lower leg with unspecified severity: Secondary | ICD-10-CM | POA: Insufficient documentation
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
## 2011-12-13 ENCOUNTER — Encounter (HOSPITAL_BASED_OUTPATIENT_CLINIC_OR_DEPARTMENT_OTHER): Payer: BC Managed Care – PPO | Attending: Plastic Surgery

## 2011-12-13 ENCOUNTER — Ambulatory Visit (INDEPENDENT_AMBULATORY_CARE_PROVIDER_SITE_OTHER): Payer: BC Managed Care – PPO | Admitting: Vascular Surgery

## 2011-12-13 DIAGNOSIS — L97909 Non-pressure chronic ulcer of unspecified part of unspecified lower leg with unspecified severity: Secondary | ICD-10-CM

## 2011-12-13 DIAGNOSIS — L97809 Non-pressure chronic ulcer of other part of unspecified lower leg with unspecified severity: Secondary | ICD-10-CM | POA: Insufficient documentation

## 2011-12-13 DIAGNOSIS — I872 Venous insufficiency (chronic) (peripheral): Secondary | ICD-10-CM

## 2011-12-17 ENCOUNTER — Encounter (HOSPITAL_BASED_OUTPATIENT_CLINIC_OR_DEPARTMENT_OTHER): Payer: BC Managed Care – PPO
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
## 2011-12-25 NOTE — Progress Notes (Signed)
[facility]  NAME:  Joshua [name], Joshua [name]                 ACCOUNT NO.:  [id number]  MEDICAL RECORD NO.:  [id number]      DATE OF BIRTH:  Oct 13, 1955  PHYSICIAN:  Wayland Denis, DO       VISIT DATE:  12/24/2011                                  OFFICE VISIT   The patient is a 56 year old gentleman who is here for followup on his left lower extremity ulcer.  He has been using Santyl and collagen over the past week with very good improvement.  There is much more granulation tissue and less fibrous tissue.  There has been no changes in medications and social history.  PHYSICAL EXAMINATION:  GENERAL:  On exam, he is alert, oriented, cooperative, not in any acute distress.  He said the pain level is also improved. CHEST:  His breathing is unlabored. HEART:  Regular.  His wound was debrided with a curette and had much better granulation tissue at the end.  We will continue with Santyl collagen and still trying to get him on the OR schedule for irrigation, debridement, and ACell placement.     Wayland Denis, DO     CS/MEDQ  D:  12/24/2011  T:  12/25/2011  Job:  [id number]

## 2012-01-01 ENCOUNTER — Encounter: Payer: Self-pay | Admitting: Internal Medicine

## 2012-01-01 ENCOUNTER — Ambulatory Visit (INDEPENDENT_AMBULATORY_CARE_PROVIDER_SITE_OTHER): Payer: BC Managed Care – PPO | Admitting: Internal Medicine

## 2012-01-01 VITALS — BP 126/86 | Temp 97.6°F | Wt 162.0 lb

## 2012-01-01 DIAGNOSIS — J449 Chronic obstructive pulmonary disease, unspecified: Secondary | ICD-10-CM

## 2012-01-01 DIAGNOSIS — F411 Generalized anxiety disorder: Secondary | ICD-10-CM

## 2012-01-01 DIAGNOSIS — K7030 Alcoholic cirrhosis of liver without ascites: Secondary | ICD-10-CM

## 2012-01-01 MED ORDER — TRAZODONE HCL 50 MG PO TABS: 100.0000 mg | ORAL_TABLET | Freq: Every day | ORAL | Status: DC

## 2012-01-01 MED ORDER — SERTRALINE HCL 100 MG PO TABS: 100.0000 mg | ORAL_TABLET | Freq: Every day | ORAL | Status: DC

## 2012-01-01 MED ORDER — TRAMADOL HCL 50 MG PO TABS: 50.0000 mg | ORAL_TABLET | Freq: Four times a day (QID) | ORAL | Status: DC | PRN

## 2012-01-01 NOTE — Progress Notes (Signed)
[facility]  NAME:  Joshua [name], Joshua [name]                      ACCOUNT NO.:  MEDICAL RECORD NO.:  [id number]      DATE OF BIRTH:  03-07-1956  PHYSICIAN:  Wayland Denis, DO       VISIT DATE:  12/20/2011                                  OFFICE VISIT   The patient is a 56 year old gentleman, here for followup on his left lower extremity ulcer from a traumatic incident.  He has been using Santyl and collagen over the last couple of weeks with some improvement. The swelling has improved as well.  The wound base looks healthier with more granulation tissue.  There has been no change in his medications or social history.  PHYSICAL EXAMINATION:  GENERAL:  He is alert, oriented, cooperative, not in any acute distress. HEENT:  His pupils are equal.  Extraocular muscles are intact. LUNGS:  His breathing is unlabored. HEART:  His heart is regular. ABDOMEN:  Soft.  His wound is as described above and improved from last week.  We will continue with collagen and we will start the VAC.  We are also working on getting him to the operating room for irrigation, debridement and placement of ACE wrap.     Wayland Denis, DO     CS/MEDQ  D:  12/31/2011  T:  12/31/2011  Job:  [id number]

## 2012-01-01 NOTE — Patient Instructions (Signed)
Limit your sodium (Salt) intake  Please check your blood pressure on a regular basis.  If it is consistently greater than 150/90, please make an office appointment.  Return in 6 months for follow-up   
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
## 2012-01-07 ENCOUNTER — Encounter (HOSPITAL_BASED_OUTPATIENT_CLINIC_OR_DEPARTMENT_OTHER): Payer: BC Managed Care – PPO

## 2012-01-08 NOTE — Progress Notes (Signed)
[facility]  NAME:  Joshua [name], Joshua [name]                 ACCOUNT NO.:  [id number]  MEDICAL RECORD NO.:  [id number]      DATE OF BIRTH:  03/27/1956  PHYSICIAN:  Wayland Denis, DO       VISIT DATE:  01/07/2012                                  OFFICE VISIT   The patient is a 56 year old gentleman who is here for followup on his left lower extremity ulcer originally from trauma.  He is doing extremely well and showing rapid progression of healing with the VAC over the last week.  The depth has improved greatly, but there is slow progress and epithelialization.  There has been no change in his medications or social history.  On exam, he is alert, oriented, and cooperative, not in any acute distress.  He is pleasant.  Pupils are equal.  Extraocular muscles are intact.  No cervical lymphadenopathy.  His breathing is unlabored.  His heart is regular.  The wound will continue with collagen and the VAC. He would be an excellent candidate for [facility] to help improve the healing time in the epithelialization and granulation over the wound, so it would like to apply for that.  I think this will cut several weeks off of his overall treatment, which would mean a decrease in his VAC time, a decrease in his visits and back to active life several weeks quicker.     Wayland Denis, DO     CS/MEDQ  D:  01/07/2012  T:  01/08/2012  Job:  [id number]

## 2012-01-15 ENCOUNTER — Encounter (HOSPITAL_BASED_OUTPATIENT_CLINIC_OR_DEPARTMENT_OTHER): Payer: BC Managed Care – PPO | Attending: Plastic Surgery

## 2012-01-15 DIAGNOSIS — L97809 Non-pressure chronic ulcer of other part of unspecified lower leg with unspecified severity: Secondary | ICD-10-CM | POA: Insufficient documentation

## 2012-01-22 NOTE — Progress Notes (Signed)
[facility]  NAME:  Joshua [name], Joshua [name]                 ACCOUNT NO.:  [id number]  MEDICAL RECORD NO.:  [id number]      DATE OF BIRTH:  1955-05-17  PHYSICIAN:  Wayland Denis, DO       VISIT DATE:  01/21/2012                                  OFFICE VISIT   The patient is a 57 year old gentleman who is here for followup on his left lower extremity ulcer.  He underwent irrigation and debridement, ACell placement, and VAC in the OR.  He has done extremely well.  It is filling in very well.  There has been no change in his medications or social history.  We will continue with the VAC over the next week and likely he will be off the [facility] and we will just do collagen or Endoform thereafter.     Wayland Denis, DO     CS/MEDQ  D:  01/21/2012  T:  01/22/2012  Job:  [id number]

## 2012-02-12 ENCOUNTER — Encounter (HOSPITAL_BASED_OUTPATIENT_CLINIC_OR_DEPARTMENT_OTHER): Payer: BC Managed Care – PPO

## 2012-02-18 ENCOUNTER — Encounter (HOSPITAL_BASED_OUTPATIENT_CLINIC_OR_DEPARTMENT_OTHER): Payer: BC Managed Care – PPO | Attending: Plastic Surgery

## 2012-02-18 DIAGNOSIS — S99919A Unspecified injury of unspecified ankle, initial encounter: Secondary | ICD-10-CM | POA: Insufficient documentation

## 2012-02-18 DIAGNOSIS — X58XXXA Exposure to other specified factors, initial encounter: Secondary | ICD-10-CM | POA: Insufficient documentation

## 2012-02-18 DIAGNOSIS — S8990XA Unspecified injury of unspecified lower leg, initial encounter: Secondary | ICD-10-CM | POA: Insufficient documentation

## 2012-02-18 DIAGNOSIS — L02419 Cutaneous abscess of limb, unspecified: Secondary | ICD-10-CM | POA: Insufficient documentation

## 2012-02-26 ENCOUNTER — Emergency Department (HOSPITAL_COMMUNITY)
Admission: EM | Admit: 2012-02-26 | Discharge: 2012-02-26 | Disposition: A | Discharge status: Home / Self Care | Payer: BC Managed Care – PPO | Attending: Emergency Medicine | Admitting: Emergency Medicine

## 2012-02-26 ENCOUNTER — Encounter (HOSPITAL_COMMUNITY): Payer: Self-pay | Admitting: Emergency Medicine

## 2012-02-26 DIAGNOSIS — R609 Edema, unspecified: Secondary | ICD-10-CM | POA: Insufficient documentation

## 2012-02-26 DIAGNOSIS — I10 Essential (primary) hypertension: Secondary | ICD-10-CM | POA: Insufficient documentation

## 2012-02-26 DIAGNOSIS — F329 Major depressive disorder, single episode, unspecified: Secondary | ICD-10-CM | POA: Insufficient documentation

## 2012-02-26 DIAGNOSIS — E739 Lactose intolerance, unspecified: Secondary | ICD-10-CM | POA: Insufficient documentation

## 2012-02-26 DIAGNOSIS — F3289 Other specified depressive episodes: Secondary | ICD-10-CM | POA: Insufficient documentation

## 2012-02-26 DIAGNOSIS — F1020 Alcohol dependence, uncomplicated: Secondary | ICD-10-CM | POA: Insufficient documentation

## 2012-02-26 DIAGNOSIS — Z79899 Other long term (current) drug therapy: Secondary | ICD-10-CM | POA: Insufficient documentation

## 2012-02-26 DIAGNOSIS — F17200 Nicotine dependence, unspecified, uncomplicated: Secondary | ICD-10-CM | POA: Insufficient documentation

## 2012-02-26 NOTE — ED Notes (Signed)
US at bedside

## 2012-02-26 NOTE — Progress Notes (Signed)
*  Preliminary Results* Left lower extremity venous duplex completed. Left lower extremity is negative for deep vein thrombosis. Preliminary results discussed with Dr.Sheldon.  02/26/2012 3:22 PM Gertie Fey, RDMS, RDCS

## 2012-02-26 NOTE — ED Notes (Signed)
Pt states he is a pt of the wound care center.  Had a wound on his left leg that he had a wound vac on.  Wound vac was removed last Monday.  About a week ago, pt started noticing swelling to his lt ankle.  Was seen at wound care center and sent over here to rule out blood clot.  Denies pain.

## 2012-02-26 NOTE — ED Provider Notes (Signed)
History     CSN: [id number]  Arrival date & time 02/26/12  1333   First MD Initiated Contact with Patient 02/26/12 1406      Chief Complaint  Patient presents with  . Leg Swelling    (Consider location/radiation/quality/duration/timing/severity/associated sxs/prior treatment) HPI Pt has been treated at [facility] recently for deep puncture wound to L medial leg. Had his wound vac and compression dressing removed last week. Reports his left leg was markedly swollen 3 days ago with redness but no pain. Swelling has improved some, but on re-check at [facility] today they were concerned about DVT and sent him to the ED for eval. Denies any CP, SOB. No fever or drainage. Wound has been healing well.   Past Medical History  Diagnosis Date  . Hepatitis C   . Hypertension   . Tobacco use   . Chronic alcoholism   . Umbilical hernia   . Glucose intolerance (impaired glucose tolerance)   . Depression   . Cirrhosis     Past Surgical History  Procedure Date  . Cataract extraction   . Inguinal hernia repair   . Umbilical hernia repair   . Colonoscopy 12/09  . Left orchiectomy 07/2009    Family History  Problem Relation Age of Onset  . Healthy Mother   . Colon cancer Neg Hx   . Stomach cancer Neg Hx     History  Substance Use Topics  . Smoking status: Current Every Day Smoker -- 0.5 packs/day    Types: Cigarettes  . Smokeless tobacco: Never Used  . Alcohol Use: 16.8 oz/week    28 Cans of beer per week      Review of Systems All other systems reviewed and are negative except as noted in HPI.   Allergies  Review of patient's allergies indicates no known allergies.  Home Medications   Current Outpatient Rx  Name Route Sig Dispense Refill  . B COMPLEX VITAMINS PO CAPS Oral Take 1 capsule by mouth daily.    Marland Kitchen ONE-DAILY MULTI VITAMINS PO TABS Oral Take 1 tablet by mouth daily.    . SERTRALINE HCL 100 MG PO TABS Oral Take 1 tablet (100 mg total) by mouth daily. 90  tablet 3  . THIAMINE HCL 100 MG PO TABS Oral Take 100 mg by mouth daily.    . TRAZODONE HCL 50 MG PO TABS Oral Take 50-100 mg by mouth at bedtime.      BP 137/95  Pulse 80  Temp 98 F (36.7 C) (Oral)  Resp 18  SpO2 100%  Physical Exam  Nursing note and vitals reviewed. Constitutional: He is oriented to person, place, and time. He appears well-developed and well-nourished.  HENT:  Head: Normocephalic and atraumatic.  Eyes: EOM are normal. Pupils are equal, round, and reactive to light.  Neck: Normal range of motion. Neck supple.  Cardiovascular: Normal rate, normal heart sounds and intact distal pulses.   Pulmonary/Chest: Effort normal and breath sounds normal.  Abdominal: Bowel sounds are normal. He exhibits no distension. There is no tenderness.  Musculoskeletal: Normal range of motion. He exhibits edema (Edema of the L lower leg with hyperema, no tenderness over the proximal deep veins, pulses norma, no warmth or induration, healing wound on medial leg). He exhibits no tenderness.  Neurological: He is alert and oriented to person, place, and time. He has normal strength. No cranial nerve deficit or sensory deficit.  Skin: Skin is warm and dry. No rash noted.  Psychiatric: He  has a normal mood and affect.    ED Course  Procedures (including critical care time)  Labs Reviewed - No data to display No results found.   No diagnosis found.    MDM  Will send for Doppler.   3:21 PM Doppler neg per vascular tech. Peripheral edema without concern for CHF or infection. Likely a sequela of his recent wound care compression stockings. Advised to continue with outpatient follow up.       Charles B. Bernette Mayers, MD 02/26/12 1521

## 2012-07-03 ENCOUNTER — Ambulatory Visit: Payer: BC Managed Care – PPO | Admitting: Internal Medicine

## 2012-07-10 ENCOUNTER — Ambulatory Visit: Payer: BC Managed Care – PPO | Admitting: Internal Medicine

## 2012-07-14 ENCOUNTER — Ambulatory Visit: Payer: BC Managed Care – PPO | Admitting: Internal Medicine

## 2012-07-16 ENCOUNTER — Encounter: Payer: Self-pay | Admitting: Internal Medicine

## 2012-07-16 ENCOUNTER — Ambulatory Visit (INDEPENDENT_AMBULATORY_CARE_PROVIDER_SITE_OTHER): Payer: BC Managed Care – PPO | Admitting: Internal Medicine

## 2012-07-16 VITALS — BP 140/80 | HR 114 | Temp 98.1°F | Resp 20 | Wt 164.0 lb

## 2012-07-16 DIAGNOSIS — F411 Generalized anxiety disorder: Secondary | ICD-10-CM

## 2012-07-16 DIAGNOSIS — R160 Hepatomegaly, not elsewhere classified: Secondary | ICD-10-CM

## 2012-07-16 DIAGNOSIS — K7030 Alcoholic cirrhosis of liver without ascites: Secondary | ICD-10-CM

## 2012-07-16 DIAGNOSIS — I10 Essential (primary) hypertension: Secondary | ICD-10-CM

## 2012-07-16 DIAGNOSIS — B1710 Acute hepatitis C without hepatic coma: Secondary | ICD-10-CM

## 2012-07-16 NOTE — Progress Notes (Signed)
Subjective:    Patient ID: Joshua [name], male    DOB: September 11, 1955, 57 y.o.   MRN: [id number]  HPI  to 63-year-old patient who is seen today for followup. He has a history of alcoholic liver disease as well as chronic hepatitis C. He has anxiety depression and a history of hypertension. More recently blood pressure has been controlled without medication. He continues to drink sporadically and has been abstinent for about 7 weeks more recently.  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 He feels quite well today with the out the inner concerns or complaints today he is seen today for general followup.  Wt Readings from Last 3 Encounters:  07/16/12 164 lb (74.39 kg)  01/01/12 162 lb (73.483 kg)  10/15/11 165 lb (74.844 kg)    Past Medical History  Diagnosis Date  . Hepatitis C   . Hypertension   . Tobacco use   . Chronic alcoholism   . Umbilical hernia   . Glucose intolerance (impaired glucose tolerance)   . Depression   . Cirrhosis     History   Social History  . Marital Status: Divorced    Spouse Name: N/A    Number of Children: N/A  . Years of Education: N/A   Occupational History  . Not on file.   Social History Main Topics  . Smoking status: Current Every Day Smoker -- 0.50 packs/day    Types: Cigarettes  . Smokeless tobacco: Never Used  . Alcohol Use: 16.8 oz/week    28 Cans of beer per week  . Drug Use: No  . Sexually Active: Not on file   Other Topics Concern  . Not on file   Social History Narrative  . No narrative on file    Past Surgical History  Procedure Laterality Date  . Cataract extraction    . Inguinal hernia repair    . Umbilical hernia repair    . Colonoscopy  12/09  . Left orchiectomy  07/2009    Family History  Problem Relation Age of Onset  . Healthy Mother   . Colon cancer Neg Hx   . Stomach cancer Neg Hx     No Known Allergies  Current Outpatient Prescriptions on File Prior to Visit  Medication Sig Dispense Refill   . b complex vitamins capsule Take 1 capsule by mouth daily.      . Multiple Vitamin (MULTIVITAMIN) tablet Take 1 tablet by mouth daily.      . sertraline (ZOLOFT) 100 MG tablet Take 1 tablet (100 mg total) by mouth daily.  90 tablet  3  . thiamine 100 MG tablet Take 100 mg by mouth daily.      . traZODone (DESYREL) 50 MG tablet Take 50-100 mg by mouth at bedtime.       No current facility-administered medications on file prior to visit.    BP 140/80  Pulse 114  Temp(Src) 98.1 F (36.7 C) (Oral)  Resp 20  Wt 164 lb (74.39 kg)  BMI 22.88 kg/m2  SpO2 96%     Review of Systems  Constitutional: Negative for fever, chills, appetite change and fatigue.  HENT: Negative for hearing loss, ear pain, congestion, sore throat, trouble swallowing, neck stiffness, dental problem, voice change and tinnitus.   Eyes: Negative for pain, discharge and visual disturbance.  Respiratory: Negative for cough, chest tightness, wheezing and stridor.   Cardiovascular: Negative for chest pain, palpitations and leg swelling.  Gastrointestinal: Negative for nausea, vomiting, abdominal  pain, diarrhea, constipation, blood in stool and abdominal distention.  Genitourinary: Negative for urgency, hematuria, flank pain, discharge, difficulty urinating and genital sores.  Musculoskeletal: Negative for myalgias, back pain, joint swelling, arthralgias and gait problem.  Skin: Negative for rash.  Neurological: Negative for dizziness, syncope, speech difficulty, weakness, numbness and headaches.  Hematological: Negative for adenopathy. Does not bruise/bleed easily.  Psychiatric/Behavioral: Positive for sleep disturbance. Negative for behavioral problems and dysphoric mood. The patient is nervous/anxious.        Objective:   Physical Exam  Constitutional: He is oriented to person, place, and time. He appears well-developed and well-nourished. No distress.  HENT:  Head: Normocephalic.  Right Ear: External ear normal.   Left Ear: External ear normal.  Eyes: Conjunctivae and EOM are normal.  Neck: Normal range of motion.  Cardiovascular: Normal rate and normal heart sounds.   Pulmonary/Chest: Breath sounds normal.  Abdominal: Bowel sounds are normal.  Prominent hepatomegaly but no splenomegaly  Musculoskeletal: Normal range of motion. He exhibits no edema and no tenderness.  Neurological: He is alert and oriented to person, place, and time.  Skin:  Digital clubbing  Psychiatric: He has a normal mood and affect. His behavior is normal.          Assessment & Plan:   Alcoholic liver disease Hypertension. Stable off medications Anxiety disorder stable  Continued abstinence encouraged. Recheck in 6 months for annual exam and lab update at that time

## 2012-07-16 NOTE — Patient Instructions (Addendum)
Limit your sodium (Salt) intake    It is important that you exercise regularly, at least 20 minutes 3 to 4 times per week.  If you develop chest pain or shortness of breath seek  medical attention.  Smoking tobacco is very bad for your health. You should stop smoking immediately.  Return in 6 months for follow-up  

## 2012-07-24 ENCOUNTER — Telehealth: Payer: Self-pay | Admitting: Internal Medicine

## 2012-07-24 MED ORDER — SILDENAFIL CITRATE 100 MG PO TABS: 100.0000 mg | ORAL_TABLET | Freq: Every day | ORAL | Status: DC | PRN

## 2012-07-24 NOTE — Telephone Encounter (Signed)
Pt notified Rx for Viagra was sent to pharmacy.

## 2012-07-24 NOTE — Telephone Encounter (Signed)
Pt would like to know if you would call in a new script for Viagra. Pt said you prescribed in the past for him. Pharm: [facility] / [address]

## 2012-07-24 NOTE — Telephone Encounter (Signed)
OK  100 mg  # 6  RF 4

## 2013-01-03 ENCOUNTER — Other Ambulatory Visit: Payer: Self-pay | Admitting: Internal Medicine

## 2013-03-25 ENCOUNTER — Ambulatory Visit: Payer: BC Managed Care – PPO | Admitting: Internal Medicine

## 2013-03-27 ENCOUNTER — Encounter: Payer: Self-pay | Admitting: Internal Medicine

## 2013-03-27 ENCOUNTER — Ambulatory Visit (INDEPENDENT_AMBULATORY_CARE_PROVIDER_SITE_OTHER): Payer: BC Managed Care – PPO | Admitting: Internal Medicine

## 2013-03-27 VITALS — BP 180/110 | HR 92 | Temp 97.9°F | Wt 158.0 lb

## 2013-03-27 DIAGNOSIS — K7030 Alcoholic cirrhosis of liver without ascites: Secondary | ICD-10-CM

## 2013-03-27 DIAGNOSIS — B1710 Acute hepatitis C without hepatic coma: Secondary | ICD-10-CM

## 2013-03-27 DIAGNOSIS — R160 Hepatomegaly, not elsewhere classified: Secondary | ICD-10-CM

## 2013-03-27 DIAGNOSIS — R634 Abnormal weight loss: Secondary | ICD-10-CM

## 2013-03-27 DIAGNOSIS — Z23 Encounter for immunization: Secondary | ICD-10-CM

## 2013-03-27 DIAGNOSIS — I10 Essential (primary) hypertension: Secondary | ICD-10-CM

## 2013-03-27 LAB — CBC WITH DIFFERENTIAL/PLATELET
Basophils Relative: 0.8 % (ref 0.0–3.0)
Eosinophils Relative: 2.6 % (ref 0.0–5.0)
Hemoglobin: 15.6 g/dL (ref 13.0–17.0)
Lymphocytes Relative: 18.3 % (ref 12.0–46.0)
Monocytes Relative: 13.5 % — ABNORMAL HIGH (ref 3.0–12.0)
Neutro Abs: 6.8 10*3/uL (ref 1.4–7.7)
RBC: 4.47 Mil/uL (ref 4.22–5.81)
WBC: 10.5 10*3/uL (ref 4.5–10.5)

## 2013-03-27 LAB — LIPID PANEL
Cholesterol: 170 mg/dL (ref 0–200)
LDL Cholesterol: 85 mg/dL (ref 0–99)
Triglycerides: 75 mg/dL (ref 0.0–149.0)

## 2013-03-27 LAB — COMPREHENSIVE METABOLIC PANEL
AST: 85 U/L — ABNORMAL HIGH (ref 0–37)
BUN: 10 mg/dL (ref 6–23)
CO2: 26 mEq/L (ref 19–32)
Calcium: 9.2 mg/dL (ref 8.4–10.5)
Chloride: 102 mEq/L (ref 96–112)
Creatinine, Ser: 0.6 mg/dL (ref 0.4–1.5)
GFR: 163.18 mL/min (ref >60.00)
Glucose, Bld: 173 mg/dL — ABNORMAL HIGH (ref 70–99)

## 2013-03-27 LAB — PROTIME-INR: INR: 1 ratio (ref 0.8–1.0)

## 2013-03-27 MED ORDER — LISINOPRIL-HYDROCHLOROTHIAZIDE 10-12.5 MG PO TABS: 1.0000 | ORAL_TABLET | Freq: Every day | ORAL | Status: DC

## 2013-03-27 NOTE — Progress Notes (Signed)
Subjective:    Patient ID: Joshua [name], male    DOB: 01/17/1956, 57 y.o.   MRN: [id number]  HPI Pre-visit discussion using our clinic review tool. No additional management support is needed unless otherwise documented below in the visit note.  57 year old patient who is in today for his six-month followup. He has a history of hypertension but more recently blood pressure has been controlled off medication. Blood pressure noted to be moderately elevated today He has a history of cirrhosis chronic hepatitis C and alcoholism. He states he continues to drink socially He has had a colonoscopy last year. No new concerns or complaints  BP Readings from Last 3 Encounters:  03/27/13 180/110  07/16/12 140/80  02/26/12 180/109   Past Medical History  Diagnosis Date  . Hepatitis C   . Hypertension   . Tobacco use   . Chronic alcoholism   . Umbilical hernia   . Glucose intolerance (impaired glucose tolerance)   . Depression   . Cirrhosis     History   Social History  . Marital Status: Divorced    Spouse Name: N/A    Number of Children: N/A  . Years of Education: N/A   Occupational History  . Not on file.   Social History Main Topics  . Smoking status: Current Every Day Smoker -- 0.50 packs/day    Types: Cigarettes  . Smokeless tobacco: Never Used  . Alcohol Use: 16.8 oz/week    28 Cans of beer per week  . Drug Use: No  . Sexual Activity: Not on file   Other Topics Concern  . Not on file   Social History Narrative  . No narrative on file    Past Surgical History  Procedure Laterality Date  . Cataract extraction    . Inguinal hernia repair    . Umbilical hernia repair    . Colonoscopy  12/09  . Left orchiectomy  07/2009    Family History  Problem Relation Age of Onset  . Healthy Mother   . Colon cancer Neg Hx   . Stomach cancer Neg Hx     No Known Allergies  Current Outpatient Prescriptions on File Prior to Visit  Medication Sig Dispense Refill  . b  complex vitamins capsule Take 1 capsule by mouth daily.      . Multiple Vitamin (MULTIVITAMIN) tablet Take 1 tablet by mouth daily.      . traZODone (DESYREL) 50 MG tablet Take 50-100 mg by mouth at bedtime.      . sertraline (ZOLOFT) 100 MG tablet Take 1 tablet (100 mg total) by mouth daily.  90 tablet  3  . sildenafil (VIAGRA) 100 MG tablet Take 1 tablet (100 mg total) by mouth daily as needed for erectile dysfunction.  6 tablet  4  . thiamine 100 MG tablet Take 100 mg by mouth daily.       No current facility-administered medications on file prior to visit.    BP 180/110  Pulse 92  Temp(Src) 97.9 F (36.6 C) (Oral)  Wt 158 lb (71.668 kg)  SpO2 96%    Review of Systems  Constitutional: Positive for appetite change and unexpected weight change. Negative for fever, chills and fatigue.  HENT: Negative for congestion, dental problem, ear pain, hearing loss, sore throat, tinnitus, trouble swallowing and voice change.   Eyes: Negative for pain, discharge and visual disturbance.  Respiratory: Negative for cough, chest tightness, wheezing and stridor.   Cardiovascular: Negative for chest pain, palpitations and  leg swelling.  Gastrointestinal: Negative for nausea, vomiting, abdominal pain, diarrhea, constipation, blood in stool and abdominal distention.  Genitourinary: Negative for urgency, hematuria, flank pain, discharge, difficulty urinating and genital sores.  Musculoskeletal: Negative for arthralgias, back pain, gait problem, joint swelling, myalgias and neck stiffness.  Skin: Negative for rash.  Neurological: Negative for dizziness, syncope, speech difficulty, weakness, numbness and headaches.  Hematological: Negative for adenopathy. Does not bruise/bleed easily.  Psychiatric/Behavioral: Negative for behavioral problems and dysphoric mood. The patient is not nervous/anxious.        Objective:   Physical Exam  Constitutional: He is oriented to person, place, and time. He appears  well-developed.  Blood pressure 170/100  HENT:  Head: Normocephalic.  Right Ear: External ear normal.  Left Ear: External ear normal.  Eyes: Conjunctivae and EOM are normal.  Neck: Normal range of motion.  Cardiovascular: Normal rate and normal heart sounds.   Pulmonary/Chest: Breath sounds normal.  Abdominal: Bowel sounds are normal.  Marked hepatomegaly No splenomegaly  Musculoskeletal: Normal range of motion. He exhibits no edema and no tenderness.  Neurological: He is alert and oriented to person, place, and time.  Skin:  Digital clubbing Spider angiomata anterior chest  Psychiatric: He has a normal mood and affect. His behavior is normal.          Assessment & Plan:   Hypertension. Will treat with lisinopril hydrochlorothiazide. Recheck 6 weeks Cirrhosis. We'll check laboratory update Chronic hepatitis C Weight loss   Total absence from alcohol discussed and encouraged

## 2013-03-27 NOTE — Patient Instructions (Signed)
Limit your sodium (Salt) intake  Please check your blood pressure on a regular basis.  If it is consistently greater than 150/90, please make an office appointment.  Abstain from all alcohol

## 2013-05-05 ENCOUNTER — Ambulatory Visit (INDEPENDENT_AMBULATORY_CARE_PROVIDER_SITE_OTHER): Payer: BC Managed Care – PPO | Admitting: Internal Medicine

## 2013-05-05 ENCOUNTER — Encounter: Payer: Self-pay | Admitting: Internal Medicine

## 2013-05-05 VITALS — BP 140/94 | Temp 98.4°F | Wt 157.0 lb

## 2013-05-05 DIAGNOSIS — I10 Essential (primary) hypertension: Secondary | ICD-10-CM

## 2013-05-05 NOTE — Progress Notes (Signed)
Subjective:    Patient ID: Joshua [name], male    DOB: 06/19/1955, 57 y.o.   MRN: [id number]  HPI Pre-visit discussion using our clinic review tool. No additional management support is needed unless otherwise documented below in the visit note.  57 year old patient who is seen today for followup of hypertension. He was placed on combination therapy 6 weeks ago due to stage II hypertension. He does have a prior history of hypertension but more recently have been controlled off medications. He has tolerated the medications well He has a history of alcoholism. He states that he has been abstinent since his last visit here after having been told that he had significant liver enlargement due to alcohol use. However he appeared to have an odor of alcohol use on his breath and his 8:30 appointment today  Past Medical History  Diagnosis Date  . Hepatitis C   . Hypertension   . Tobacco use   . Chronic alcoholism   . Umbilical hernia   . Glucose intolerance (impaired glucose tolerance)   . Depression   . Cirrhosis     History   Social History  . Marital Status: Divorced    Spouse Name: N/A    Number of Children: N/A  . Years of Education: N/A   Occupational History  . Not on file.   Social History Main Topics  . Smoking status: Current Every Day Smoker -- 0.50 packs/day    Types: Cigarettes  . Smokeless tobacco: Never Used  . Alcohol Use: 16.8 oz/week    28 Cans of beer per week  . Drug Use: No  . Sexual Activity: Not on file   Other Topics Concern  . Not on file   Social History Narrative  . No narrative on file    Past Surgical History  Procedure Laterality Date  . Cataract extraction    . Inguinal hernia repair    . Umbilical hernia repair    . Colonoscopy  12/09  . Left orchiectomy  07/2009    Family History  Problem Relation Age of Onset  . Healthy Mother   . Colon cancer Neg Hx   . Stomach cancer Neg Hx     No Known Allergies  Current Outpatient  Prescriptions on File Prior to Visit  Medication Sig Dispense Refill  . b complex vitamins capsule Take 1 capsule by mouth daily.      Marland Kitchen lisinopril-hydrochlorothiazide (PRINZIDE,ZESTORETIC) 10-12.5 MG per tablet Take 1 tablet by mouth daily.  90 tablet  3  . Multiple Vitamin (MULTIVITAMIN) tablet Take 1 tablet by mouth daily.      . sertraline (ZOLOFT) 50 MG tablet Take 50 mg by mouth daily.      . sildenafil (VIAGRA) 100 MG tablet Take 1 tablet (100 mg total) by mouth daily as needed for erectile dysfunction.  6 tablet  4  . thiamine 100 MG tablet Take 100 mg by mouth daily.      . traZODone (DESYREL) 50 MG tablet Take 50-100 mg by mouth at bedtime.      . sertraline (ZOLOFT) 100 MG tablet Take 1 tablet (100 mg total) by mouth daily.  90 tablet  3   No current facility-administered medications on file prior to visit.    BP 140/94  Temp(Src) 98.4 F (36.9 C) (Oral)  Wt 157 lb (71.215 kg)       Review of Systems  Constitutional: Negative for fever, chills, appetite change and fatigue.  HENT: Negative for congestion,  dental problem, ear pain, hearing loss, sore throat, tinnitus, trouble swallowing and voice change.   Eyes: Negative for pain, discharge and visual disturbance.  Respiratory: Negative for cough, chest tightness, wheezing and stridor.   Cardiovascular: Negative for chest pain, palpitations and leg swelling.  Gastrointestinal: Negative for nausea, vomiting, abdominal pain, diarrhea, constipation, blood in stool and abdominal distention.  Genitourinary: Negative for urgency, hematuria, flank pain, discharge, difficulty urinating and genital sores.  Musculoskeletal: Negative for arthralgias, back pain, gait problem, joint swelling, myalgias and neck stiffness.       Mild right wrist pain  Skin: Negative for rash.  Neurological: Negative for dizziness, syncope, speech difficulty, weakness, numbness and headaches.  Hematological: Negative for adenopathy. Does not bruise/bleed  easily.  Psychiatric/Behavioral: Negative for behavioral problems and dysphoric mood. The patient is not nervous/anxious.        Objective:   Physical Exam  Constitutional: He appears well-developed and well-nourished. No distress.  Blood pressure somewhat variable as low as 120/80 in the right arm and as high as 140/94          Assessment & Plan:   Hypertension improved. We'll continue present regimen. Recheck 6 months. Home blood pressure monitor and encouraged Alcoholism. Total abstinence encouraged

## 2013-05-05 NOTE — Progress Notes (Signed)
Pre visit review using our clinic review tool, if applicable. No additional management support is needed unless otherwise documented below in the visit note. 

## 2013-05-05 NOTE — Patient Instructions (Signed)
Limit your sodium (Salt) intake  Please check your blood pressure on a regular basis.  If it is consistently greater than 150/90, please make an office appointment.  Return in 6 months for follow-up   

## 2013-05-08 ENCOUNTER — Other Ambulatory Visit: Payer: Self-pay | Admitting: Internal Medicine

## 2013-12-14 ENCOUNTER — Telehealth: Payer: Self-pay | Admitting: Internal Medicine

## 2013-12-14 ENCOUNTER — Ambulatory Visit (INDEPENDENT_AMBULATORY_CARE_PROVIDER_SITE_OTHER): Payer: 59 | Admitting: Internal Medicine

## 2013-12-14 ENCOUNTER — Encounter: Payer: Self-pay | Admitting: Internal Medicine

## 2013-12-14 VITALS — BP 142/84 | HR 80 | Temp 97.9°F | Resp 20 | Ht 71.0 in | Wt 154.0 lb

## 2013-12-14 DIAGNOSIS — R7302 Impaired glucose tolerance (oral): Secondary | ICD-10-CM

## 2013-12-14 DIAGNOSIS — R7309 Other abnormal glucose: Secondary | ICD-10-CM

## 2013-12-14 DIAGNOSIS — B1710 Acute hepatitis C without hepatic coma: Secondary | ICD-10-CM

## 2013-12-14 DIAGNOSIS — I10 Essential (primary) hypertension: Secondary | ICD-10-CM

## 2013-12-14 DIAGNOSIS — K7030 Alcoholic cirrhosis of liver without ascites: Secondary | ICD-10-CM

## 2013-12-14 LAB — HEMOGLOBIN A1C: Hgb A1c MFr Bld: 5.1 % (ref 4.6–6.5)

## 2013-12-14 NOTE — Telephone Encounter (Signed)
Relevant patient education mailed to patient.  

## 2013-12-14 NOTE — Progress Notes (Signed)
   Subjective:    Patient ID: Joshua [name], male    DOB: 20-Jul-1955, 58 y.o.   MRN: [id number]  HPI  Wt Readings from Last 3 Encounters:  12/14/13 154 lb (69.854 kg)  05/05/13 157 lb (71.215 kg)  03/27/13 158 lb (71.668 kg)     Review of Systems     Objective:   Physical Exam        Assessment & Plan:

## 2013-12-14 NOTE — Progress Notes (Signed)
Subjective:    Patient ID: Joshua [name], male    DOB: 1956/04/22, 58 y.o.   MRN: [id number]  HPI  58 year old patient who has a history of alcoholism and cirrhosis.  He also has a history of chronic hepatitis C History of hypertension, and a history of impaired glucose tolerance.  He is doing quite well today.  His only complaint is right wrist pain.  He is seen in the orthopedic specialist  He states he has been sober and is followed by his substance abuse counselor Past Medical History  Diagnosis Date  . Hepatitis C   . Hypertension   . Tobacco use   . Chronic alcoholism   . Umbilical hernia   . Glucose intolerance (impaired glucose tolerance)   . Depression   . Cirrhosis     History   Social History  . Marital Status: Divorced    Spouse Name: N/A    Number of Children: N/A  . Years of Education: N/A   Occupational History  . Not on file.   Social History Main Topics  . Smoking status: Current Every Day Smoker -- 0.50 packs/day    Types: Cigarettes  . Smokeless tobacco: Never Used  . Alcohol Use: 16.8 oz/week    28 Cans of beer per week  . Drug Use: No  . Sexual Activity: Not on file   Other Topics Concern  . Not on file   Social History Narrative  . No narrative on file    Past Surgical History  Procedure Laterality Date  . Cataract extraction    . Inguinal hernia repair    . Umbilical hernia repair    . Colonoscopy  12/09  . Left orchiectomy  07/2009    Family History  Problem Relation Age of Onset  . Healthy Mother   . Colon cancer Neg Hx   . Stomach cancer Neg Hx     No Known Allergies  Current Outpatient Prescriptions on File Prior to Visit  Medication Sig Dispense Refill  . b complex vitamins capsule Take 1 capsule by mouth daily.      Marland Kitchen lisinopril-hydrochlorothiazide (PRINZIDE,ZESTORETIC) 10-12.5 MG per tablet Take 1 tablet by mouth daily.  90 tablet  3  . Multiple Vitamin (MULTIVITAMIN) tablet Take 1 tablet by mouth daily.      .  sertraline (ZOLOFT) 100 MG tablet TAKE 1 TABLET EVERY DAY  30 tablet  0  . sildenafil (VIAGRA) 100 MG tablet Take 1 tablet (100 mg total) by mouth daily as needed for erectile dysfunction.  6 tablet  4  . thiamine 100 MG tablet Take 100 mg by mouth daily.      . traZODone (DESYREL) 50 MG tablet TAKE 2 TABLETS AT BEDTIME  180 tablet  0   No current facility-administered medications on file prior to visit.    BP 142/84  Pulse 80  Temp(Src) 97.9 F (36.6 C) (Oral)  Resp 20  Ht 5\' 11"  (1.803 m)  Wt 154 lb (69.854 kg)  BMI 21.49 kg/m2  SpO2 97%     Review of Systems  Constitutional: Negative for fever, chills, appetite change and fatigue.  HENT: Negative for congestion, dental problem, ear pain, hearing loss, sore throat, tinnitus, trouble swallowing and voice change.   Eyes: Negative for pain, discharge and visual disturbance.  Respiratory: Negative for cough, chest tightness, wheezing and stridor.   Cardiovascular: Negative for chest pain, palpitations and leg swelling.  Gastrointestinal: Negative for nausea, vomiting, abdominal pain, diarrhea, constipation,  blood in stool and abdominal distention.  Genitourinary: Negative for urgency, hematuria, flank pain, discharge, difficulty urinating and genital sores.  Musculoskeletal: Negative for arthralgias, back pain, gait problem, joint swelling, myalgias and neck stiffness.       Right wrist pain  Skin: Negative for rash.  Neurological: Negative for dizziness, syncope, speech difficulty, weakness, numbness and headaches.  Hematological: Negative for adenopathy. Does not bruise/bleed easily.  Psychiatric/Behavioral: Negative for behavioral problems and dysphoric mood. The patient is not nervous/anxious.        Objective:   Physical Exam  Constitutional: He is oriented to person, place, and time. He appears well-developed.  HENT:  Head: Normocephalic.  Right Ear: External ear normal.  Left Ear: External ear normal.  Eyes:  Conjunctivae and EOM are normal.  Neck: Normal range of motion.  Cardiovascular: Normal rate and normal heart sounds.   Pulmonary/Chest: Breath sounds normal.  Abdominal: Bowel sounds are normal.  Prominent hepatomegaly  Musculoskeletal: Normal range of motion. He exhibits no edema and no tenderness.  Neurological: He is alert and oriented to person, place, and time.  Psychiatric: He has a normal mood and affect. His behavior is normal.          Assessment & Plan:   Hypertension well controlled Impaired glucose tolerance.  We'll screen for overt diabetes with a hemoglobin A1c Alcohol cirrhosis Chronic hepatitis C.  We'll discuss referral to chronic hepatitis C clinic  Recheck 6 months

## 2013-12-14 NOTE — Progress Notes (Signed)
Pre visit review using our clinic review tool, if applicable. No additional management support is needed unless otherwise documented below in the visit note. 

## 2013-12-14 NOTE — Patient Instructions (Signed)
Limit your sodium (Salt) intake  Return in 6 months for follow-up  

## 2013-12-15 ENCOUNTER — Telehealth: Payer: Self-pay | Admitting: Internal Medicine

## 2013-12-15 NOTE — Telephone Encounter (Signed)
Relevant patient education mailed to patient.  

## 2013-12-28 ENCOUNTER — Encounter: Payer: Self-pay | Admitting: Gastroenterology

## 2013-12-29 ENCOUNTER — Other Ambulatory Visit: Payer: 59

## 2013-12-30 ENCOUNTER — Encounter: Payer: Self-pay | Admitting: Sports Medicine

## 2013-12-30 ENCOUNTER — Ambulatory Visit (INDEPENDENT_AMBULATORY_CARE_PROVIDER_SITE_OTHER): Payer: 59 | Admitting: Sports Medicine

## 2013-12-30 VITALS — BP 135/91 | Ht 71.0 in | Wt 156.0 lb

## 2013-12-30 DIAGNOSIS — M6740 Ganglion, unspecified site: Secondary | ICD-10-CM

## 2013-12-30 DIAGNOSIS — M67431 Ganglion, right wrist: Secondary | ICD-10-CM

## 2013-12-30 DIAGNOSIS — M67439 Ganglion, unspecified wrist: Secondary | ICD-10-CM | POA: Insufficient documentation

## 2013-12-30 NOTE — Progress Notes (Signed)
   Subjective:    Patient ID: Joshua [name], male    DOB: 1955-07-20, 58 y.o.   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 Only trauma he can recall is getting his right wrist caught in a doorjamb but he does not remember any immediate pain or swelling at that time. It was only later that he began to notice some swelling along the volar aspect of his wrist. That swelling has persisted and at times will cause a diffuse aching discomfort throughout the wrist. This is most noticeable with activity such as painting. For the most part his symptoms are tolerable. He has been using Voltaren gel and using a compression wrap. He denies any numbness or tingling into his hand or wrist. He denies swelling elsewhere through the wrist. His main concern is that he may have done some significant damage when he got his wrist caught in the doorjamb.  Past medical history reviewed Medications reviewed Allergies reviewed    Review of Systems     Objective:   Physical Exam Well-developed, well-nourished. No acute distress. Awake alert and oriented x3.  Right wrist: Full range of motion. There is a palpable complex ganglion cyst along the volar aspect of his wrist. It is nontender to palpation no other soft tissue swelling. No joint effusion. No bony or soft tissue tenderness to direct palpation. Negative Tinel's at the carpal tunnel. No atrophy. Good radial and ulnar pulses. Good grip strength.       Assessment & Plan:  Right wrist ganglion cyst  Reassurance. Cyst is not symptomatic enough that he wants to consider treatment. If he reconsiders, we could consider referral to one of the hand specialists for excision. Continue with compression and topical Voltaren when necessary. Followup prn.

## 2014-01-26 ENCOUNTER — Telehealth: Payer: Self-pay | Admitting: *Deleted

## 2014-01-26 DIAGNOSIS — M25531 Pain in right wrist: Secondary | ICD-10-CM

## 2014-01-26 NOTE — Telephone Encounter (Signed)
Received voicemail message from Dr. Luetta Nutting regarding this pt and visit with Dr. Micheline Chapman. Will call tomorrow.

## 2014-01-27 NOTE — Telephone Encounter (Signed)
Called Dr. Luetta Nutting back, pt's father. He said pt went to see Dr. Micheline Chapman for wrist pain per Dr. Raliegh Ip  and did not have a referral so they are not covering it. He asked if we could please send referral for Dr. Micheline Chapman. Told him I will send referral to our referral coordinator and she will send over. Dr. Luetta Nutting verbalized understanding.

## 2014-02-01 ENCOUNTER — Ambulatory Visit: Payer: 59 | Admitting: Sports Medicine

## 2014-02-21 ENCOUNTER — Other Ambulatory Visit: Payer: Self-pay | Admitting: Internal Medicine

## 2014-04-12 ENCOUNTER — Encounter: Payer: Self-pay | Admitting: Internal Medicine

## 2014-04-12 ENCOUNTER — Ambulatory Visit (INDEPENDENT_AMBULATORY_CARE_PROVIDER_SITE_OTHER): Payer: 59 | Admitting: Internal Medicine

## 2014-04-12 DIAGNOSIS — I10 Essential (primary) hypertension: Secondary | ICD-10-CM

## 2014-04-12 DIAGNOSIS — M5010 Cervical disc disorder with radiculopathy, unspecified cervical region: Secondary | ICD-10-CM

## 2014-04-12 MED ORDER — HYDROCODONE-ACETAMINOPHEN 5-325 MG PO TABS: 1.0000 | ORAL_TABLET | Freq: Four times a day (QID) | ORAL | Status: DC | PRN

## 2014-04-12 MED ORDER — CYCLOBENZAPRINE HCL 5 MG PO TABS: 5.0000 mg | ORAL_TABLET | Freq: Three times a day (TID) | ORAL | Status: DC | PRN

## 2014-04-12 MED ORDER — METHYLPREDNISOLONE ACETATE 80 MG/ML IJ SUSP
80.0000 mg | Freq: Once | INTRAMUSCULAR | Status: AC
  Administered 2014-04-12: 80 mg via INTRAMUSCULAR

## 2014-04-12 NOTE — Progress Notes (Signed)
Pre visit review using our clinic review tool, if applicable. No additional management support is needed unless otherwise documented below in the visit note. 

## 2014-04-12 NOTE — Progress Notes (Signed)
Subjective:    Patient ID: Joshua [name], male    DOB: 1955-07-23, 58 y.o.   MRN: [id number]  HPI 58 year old patient who presents with a 5-6 week history of right upper back discomfort pain has intensified and now he states the pain is fairly constant and interferes with sleep.  He states that he is not able to get in a comfortable position.  There is some radiation of the pain to the right axilla and the right upper arm.  He describes a numbness involving the inner aspect of the right upper arm and his sense of tingling.  Symptoms have been refractory to heat, ice, Voltaren. He denies any aggravating activities, but he has been quite careful with activities since the onset of his pain    Past Medical History  Diagnosis Date  . Hepatitis C   . Hypertension   . Tobacco use   . Chronic alcoholism   . Umbilical hernia   . Glucose intolerance (impaired glucose tolerance)   . Depression   . Cirrhosis     History   Social History  . Marital Status: Divorced    Spouse Name: N/A    Number of Children: N/A  . Years of Education: N/A   Occupational History  . Not on file.   Social History Main Topics  . Smoking status: Current Every Day Smoker -- 0.50 packs/day    Types: Cigarettes  . Smokeless tobacco: Never Used  . Alcohol Use: 16.8 oz/week    28 Cans of beer per week  . Drug Use: No  . Sexual Activity: Not on file   Other Topics Concern  . Not on file   Social History Narrative    Past Surgical History  Procedure Laterality Date  . Cataract extraction    . Inguinal hernia repair    . Umbilical hernia repair    . Colonoscopy  12/09  . Left orchiectomy  07/2009    Family History  Problem Relation Age of Onset  . Healthy Mother   . Colon cancer Neg Hx   . Stomach cancer Neg Hx     No Known Allergies  Current Outpatient Prescriptions on File Prior to Visit  Medication Sig Dispense Refill  . b complex vitamins capsule Take 1 capsule by mouth daily.    Marland Kitchen  lisinopril-hydrochlorothiazide (PRINZIDE,ZESTORETIC) 10-12.5 MG per tablet Take 1 tablet by mouth daily. 90 tablet 3  . Multiple Vitamin (MULTIVITAMIN) tablet Take 1 tablet by mouth daily.    . sertraline (ZOLOFT) 100 MG tablet TAKE 1 TABLET EVERY DAY 30 tablet 0  . sildenafil (VIAGRA) 100 MG tablet Take 1 tablet (100 mg total) by mouth daily as needed for erectile dysfunction. 6 tablet 4  . thiamine 100 MG tablet Take 100 mg by mouth daily.    . traZODone (DESYREL) 50 MG tablet TAKE 2 TABLETS AT BEDTIME 180 tablet 0   No current facility-administered medications on file prior to visit.    BP 150/90 mmHg  Pulse 83  Temp(Src) 97.9 F (36.6 C) (Oral)  Resp 20  Ht 5\' 11"  (1.803 m)  Wt 159 lb (72.122 kg)  BMI 22.19 kg/m2  SpO2 98%      Review of Systems  Constitutional: Negative for fever, chills, appetite change and fatigue.  HENT: Negative for congestion, dental problem, ear pain, hearing loss, sore throat, tinnitus, trouble swallowing and voice change.   Eyes: Negative for pain, discharge and visual disturbance.  Respiratory: Negative for cough, chest tightness,  wheezing and stridor.   Cardiovascular: Negative for chest pain, palpitations and leg swelling.  Gastrointestinal: Negative for nausea, vomiting, abdominal pain, diarrhea, constipation, blood in stool and abdominal distention.  Genitourinary: Negative for urgency, hematuria, flank pain, discharge, difficulty urinating and genital sores.  Musculoskeletal: Positive for back pain. Negative for myalgias, joint swelling, arthralgias, gait problem and neck stiffness.  Skin: Negative for rash.  Neurological: Positive for numbness. Negative for dizziness, syncope, speech difficulty, weakness and headaches.  Hematological: Negative for adenopathy. Does not bruise/bleed easily.  Psychiatric/Behavioral: Negative for behavioral problems and dysphoric mood. The patient is not nervous/anxious.        Objective:   Physical Exam    Constitutional: He appears well-developed and well-nourished.  Musculoskeletal:  No muscle atrophy Pain is aggravated by head turning to the right  Neurological:  Reflexes are symmetrical Triceps diminished bilaterally  Normal grip strength Normal.  Arm flexion and extension  Skin:  Stigmata of chronic liver disease Digital clubbing          Assessment & Plan:   Suspect C8 radiculopathy.  Will treat with Depo-Medrol and treated symptomatically with a soft cervical collar.  Will consider cervical MRI Alcohol abuse Hypertension, stable

## 2014-04-12 NOTE — Patient Instructions (Signed)
Call or return to clinic prn if these symptoms worsen or fail to improve as anticipated.  Cervical Radiculopathy Cervical radiculopathy happens when a nerve in the neck is pinched or bruised by a slipped (herniated) disk or by arthritic changes in the bones of the cervical spine. This can occur due to an injury or as part of the normal aging process. Pressure on the cervical nerves can cause pain or numbness that runs from your neck all the way down into your arm and fingers. CAUSES  There are many possible causes, including:  Injury.  Muscle tightness in the neck from overuse.  Swollen, painful joints (arthritis).  Breakdown or degeneration in the bones and joints of the spine (spondylosis) due to aging.  Bone spurs that may develop near the cervical nerves. SYMPTOMS  Symptoms include pain, weakness, or numbness in the affected arm and hand. Pain can be severe or irritating. Symptoms may be worse when extending or turning the neck. DIAGNOSIS  Your caregiver will ask about your symptoms and do a physical exam. He or she may test your strength and reflexes. X-rays, CT scans, and MRI scans may be needed in cases of injury or if the symptoms do not go away after a period of time. Electromyography (EMG) or nerve conduction testing may be done to study how your nerves and muscles are working. TREATMENT  Your caregiver may recommend certain exercises to help relieve your symptoms. Cervical radiculopathy can, and often does, get better with time and treatment. If your problems continue, treatment options may include:  Wearing a soft collar for short periods of time.  Physical therapy to strengthen the neck muscles.  Medicines, such as nonsteroidal anti-inflammatory drugs (NSAIDs), oral corticosteroids, or spinal injections.  Surgery. Different types of surgery may be done depending on the cause of your problems. HOME CARE INSTRUCTIONS   Put ice on the affected area.  Put ice in a plastic  bag.  Place a towel between your skin and the bag.  Leave the ice on for 15-20 minutes, 03-04 times a day or as directed by your caregiver.  If ice does not help, you can try using heat. Take a warm shower or bath, or use a hot water bottle as directed by your caregiver.  You may try a gentle neck and shoulder massage.  Use a flat pillow when you sleep.  Only take over-the-counter or prescription medicines for pain, discomfort, or fever as directed by your caregiver.  If physical therapy was prescribed, follow your caregiver's directions.  If a soft collar was prescribed, use it as directed. SEEK IMMEDIATE MEDICAL CARE IF:   Your pain gets much worse and cannot be controlled with medicines.  You have weakness or numbness in your hand, arm, face, or leg.  You have a high fever or a stiff, rigid neck.  You lose bowel or bladder control (incontinence).  You have trouble with walking, balance, or speaking. MAKE SURE YOU:   Understand these instructions.  Will watch your condition.  Will get help right away if you are not doing well or get worse. Document Released: 01/23/2001 Document Revised: 07/23/2011 Document Reviewed: 12/12/2010 [facility] Patient Information 2015 [place], [place]. This information is not intended to replace advice given to you by your health care provider. Make sure you discuss any questions you have with your health care provider.

## 2014-08-13 ENCOUNTER — Other Ambulatory Visit: Payer: Self-pay | Admitting: Internal Medicine

## 2014-09-17 ENCOUNTER — Telehealth: Payer: Self-pay | Admitting: Internal Medicine

## 2014-09-17 NOTE — Telephone Encounter (Signed)
Joshua [name]  Dr Luetta Nutting would like a call back [phone]

## 2014-09-17 NOTE — Telephone Encounter (Signed)
Spoke to Dr. Luetta Nutting wanted to know when pt was seen last and when he should come back, also to make wife's physical. Told him pt last seen 03/2014 and needs to schedule physical has not had one in a while. Told Dr. Ophelia Shoulder to call main number and they can schedule appointments. Dr. Luetta Nutting verbalized understanding.

## 2014-09-24 ENCOUNTER — Other Ambulatory Visit: Payer: Self-pay | Admitting: Internal Medicine

## 2014-10-04 ENCOUNTER — Telehealth: Payer: Self-pay | Admitting: Internal Medicine

## 2014-10-04 NOTE — Telephone Encounter (Signed)
Dr. Luetta Nutting would like to talk to you about patient before his is seen tomorrow.

## 2014-10-05 ENCOUNTER — Ambulatory Visit (INDEPENDENT_AMBULATORY_CARE_PROVIDER_SITE_OTHER): Payer: 59 | Admitting: Internal Medicine

## 2014-10-05 VITALS — BP 160/100 | HR 88 | Temp 98.2°F | Resp 18 | Ht 71.0 in | Wt 160.0 lb

## 2014-10-05 DIAGNOSIS — K7030 Alcoholic cirrhosis of liver without ascites: Secondary | ICD-10-CM | POA: Diagnosis not present

## 2014-10-05 DIAGNOSIS — B182 Chronic viral hepatitis C: Secondary | ICD-10-CM | POA: Insufficient documentation

## 2014-10-05 DIAGNOSIS — R531 Weakness: Secondary | ICD-10-CM | POA: Diagnosis not present

## 2014-10-05 DIAGNOSIS — I10 Essential (primary) hypertension: Secondary | ICD-10-CM | POA: Diagnosis not present

## 2014-10-05 DIAGNOSIS — Z720 Tobacco use: Secondary | ICD-10-CM

## 2014-10-05 LAB — CBC WITH DIFFERENTIAL/PLATELET
BASOS ABS: 0 10*3/uL (ref 0.0–0.1)
Basophils Relative: 0.4 % (ref 0.0–3.0)
EOS PCT: 2.4 % (ref 0.0–5.0)
Eosinophils Absolute: 0.3 10*3/uL (ref 0.0–0.7)
HCT: 45.3 % (ref 39.0–52.0)
HEMOGLOBIN: 15.8 g/dL (ref 13.0–17.0)
Lymphocytes Relative: 18.4 % (ref 12.0–46.0)
Lymphs Abs: 1.9 10*3/uL (ref 0.7–4.0)
MCHC: 34.8 g/dL (ref 30.0–36.0)
MCV: 100.1 fl — AB (ref 78.0–100.0)
Monocytes Absolute: 1.5 10*3/uL — ABNORMAL HIGH (ref 0.1–1.0)
Monocytes Relative: 14 % — ABNORMAL HIGH (ref 3.0–12.0)
NEUTROS ABS: 6.8 10*3/uL (ref 1.4–7.7)
Neutrophils Relative %: 64.8 % (ref 43.0–77.0)
Platelets: 207 10*3/uL (ref 150.0–400.0)
RBC: 4.53 Mil/uL (ref 4.22–5.81)
RDW: 14.1 % (ref 11.5–15.5)
WBC: 10.6 10*3/uL — ABNORMAL HIGH (ref 4.0–10.5)

## 2014-10-05 LAB — COMPREHENSIVE METABOLIC PANEL
ALBUMIN: 3.8 g/dL (ref 3.5–5.2)
ALK PHOS: 177 U/L — AB (ref 39–117)
ALT: 61 U/L — ABNORMAL HIGH (ref 0–53)
AST: 94 U/L — AB (ref 0–37)
BILIRUBIN TOTAL: 0.6 mg/dL (ref 0.2–1.2)
BUN: 9 mg/dL (ref 6–23)
CALCIUM: 9.6 mg/dL (ref 8.4–10.5)
CO2: 27 meq/L (ref 19–32)
CREATININE: 0.54 mg/dL (ref 0.40–1.50)
Chloride: 102 mEq/L (ref 96–112)
GFR: 165.78 mL/min (ref >60.00)
GLUCOSE: 97 mg/dL (ref 70–99)
POTASSIUM: 4.3 meq/L (ref 3.5–5.1)
SODIUM: 138 meq/L (ref 135–145)
Total Protein: 7.6 g/dL (ref 6.0–8.3)

## 2014-10-05 LAB — PROTIME-INR
INR: 1 ratio (ref 0.8–1.0)
Prothrombin Time: 10.9 s (ref 9.6–13.1)

## 2014-10-05 LAB — TSH: TSH: 2.08 u[IU]/mL (ref 0.35–4.50)

## 2014-10-05 MED ORDER — SERTRALINE HCL 100 MG PO TABS: 100.0000 mg | ORAL_TABLET | Freq: Every day | ORAL | Status: DC

## 2014-10-05 MED ORDER — TRAZODONE HCL 50 MG PO TABS: 100.0000 mg | ORAL_TABLET | Freq: Every day | ORAL | Status: DC

## 2014-10-05 MED ORDER — LISINOPRIL-HYDROCHLOROTHIAZIDE 10-12.5 MG PO TABS: 1.0000 | ORAL_TABLET | Freq: Every day | ORAL | Status: DC

## 2014-10-05 NOTE — Patient Instructions (Addendum)
Limit your sodium (Salt) intake  Please check your blood pressure on a regular basis.  If it is consistently greater than 150/90, please make an office appointment.  Return in one month for follow-up  Smoking tobacco is very bad for your health. You should stop smoking immediately.

## 2014-10-05 NOTE — Progress Notes (Signed)
Pre visit review using our clinic review tool, if applicable. No additional management support is needed unless otherwise documented below in the visit note. 

## 2014-10-05 NOTE — Progress Notes (Signed)
Subjective:    Patient ID: Joshua [name], male    DOB: 1955/09/15, 59 y.o.   MRN: [id number]  HPI  Wt Readings from Last 3 Encounters:  10/05/14 160 lb (72.576 kg)  04/12/14 159 lb (72.122 kg)  12/30/13 156 lb (70.57 kg)   59 year old patient who has a history of alcohol abuse, chronic hepatitis C and treated hypertension. He has been off his blood pressure medications. He continues to smoke 1 half pack of cigarettes daily.  He has a history of alcoholism and states that he had a relapse about one month ago. He states that he feels"good" today and has no complaints.  His family describes poor exercise capacity and weakness No recent blood work No recent screening liver ultrasound  He states that that his knowledge, he has not been evaluated for chronic hepatitis C nor considered for treatment.  His stepfather is a retired Engineer, drilling  Past Medical History  Diagnosis Date  . Hepatitis C   . Hypertension   . Tobacco use   . Chronic alcoholism   . Umbilical hernia   . Glucose intolerance (impaired glucose tolerance)   . Depression   . Cirrhosis     History   Social History  . Marital Status: Divorced    Spouse Name: N/A  . Number of Children: N/A  . Years of Education: N/A   Occupational History  . Not on file.   Social History Main Topics  . Smoking status: Current Every Day Smoker -- 0.50 packs/day    Types: Cigarettes  . Smokeless tobacco: Never Used  . Alcohol Use: 16.8 oz/week    28 Cans of beer per week  . Drug Use: No  . Sexual Activity: Not on file   Other Topics Concern  . Not on file   Social History Narrative    Past Surgical History  Procedure Laterality Date  . Cataract extraction    . Inguinal hernia repair    . Umbilical hernia repair    . Colonoscopy  12/09  . Left orchiectomy  07/2009    Family History  Problem Relation Age of Onset  . Healthy Mother   . Colon cancer Neg Hx   . Stomach cancer Neg Hx     No Known  Allergies  Current Outpatient Prescriptions on File Prior to Visit  Medication Sig Dispense Refill  . b complex vitamins capsule Take 1 capsule by mouth daily.    . Multiple Vitamin (MULTIVITAMIN) tablet Take 1 tablet by mouth daily.    . sertraline (ZOLOFT) 100 MG tablet TAKE 1 TABLET EVERY DAY 30 tablet 0  . sildenafil (VIAGRA) 100 MG tablet Take 1 tablet (100 mg total) by mouth daily as needed for erectile dysfunction. 6 tablet 4  . thiamine 100 MG tablet Take 100 mg by mouth daily.    . traZODone (DESYREL) 50 MG tablet TAKE 2 TABLETS AT BEDTIME 180 tablet 1  . lisinopril-hydrochlorothiazide (PRINZIDE,ZESTORETIC) 10-12.5 MG per tablet TAKE 1 TABLET BY MOUTH EVERY DAY (Patient not taking: Reported on 10/05/2014) 30 tablet 0   No current facility-administered medications on file prior to visit.    BP 160/100 mmHg  Pulse 88  Temp(Src) 98.2 F (36.8 C) (Oral)  Resp 18  Ht 5\' 11"  (1.803 m)  Wt 160 lb (72.576 kg)  BMI 22.33 kg/m2  SpO2 97%      Review of Systems  Constitutional: Negative for fever, chills, appetite change and fatigue.  HENT: Negative for congestion, dental problem,  ear pain, hearing loss, sore throat, tinnitus, trouble swallowing and voice change.   Eyes: Negative for pain, discharge and visual disturbance.  Respiratory: Negative for cough, chest tightness, wheezing and stridor.   Cardiovascular: Negative for chest pain, palpitations and leg swelling.  Gastrointestinal: Negative for nausea, vomiting, abdominal pain, diarrhea, constipation, blood in stool and abdominal distention.  Genitourinary: Negative for urgency, hematuria, flank pain, discharge, difficulty urinating and genital sores.  Musculoskeletal: Negative for myalgias, back pain, joint swelling, arthralgias, gait problem and neck stiffness.  Skin: Negative for rash.  Neurological: Positive for weakness. Negative for dizziness, syncope, speech difficulty, numbness and headaches.  Hematological: Negative  for adenopathy. Does not bruise/bleed easily.  Psychiatric/Behavioral: Negative for behavioral problems and dysphoric mood. The patient is not nervous/anxious.        Objective:   Physical Exam  Constitutional: He is oriented to person, place, and time. He appears well-developed.  Appears chronically ill and older than his stated age Blood pressure 160/90  HENT:  Head: Normocephalic.  Right Ear: External ear normal.  Left Ear: External ear normal.  Eyes: Conjunctivae and EOM are normal.  Neck: Normal range of motion.  Cardiovascular: Normal rate, regular rhythm and normal heart sounds.   Pulmonary/Chest: Breath sounds normal.  Abdominal: Bowel sounds are normal.  Prominent hepatomegaly  Musculoskeletal: Normal range of motion. He exhibits no edema or tenderness.  Lymphadenopathy:    He has no cervical adenopathy.  Neurological: He is alert and oriented to person, place, and time.  Skin:  Prominent ecchymoses over the arms With spider angiomata  Psychiatric: He has a normal mood and affect. His behavior is normal.          Assessment & Plan:   Weakness.  We'll check updated lab Alcoholism/cirrhosis Chronic hepatitis C.  Will refer to hepatitis clinic.  Check abdominal ultrasound Ongoing tobacco use.  Will set up for low intensity chest CT screening Hypertension/noncompliance.  Will resume antihypertensive medication  Recheck one month

## 2014-10-06 ENCOUNTER — Encounter: Payer: Self-pay | Admitting: Internal Medicine

## 2014-10-14 ENCOUNTER — Telehealth: Payer: Self-pay | Admitting: Internal Medicine

## 2014-10-14 NOTE — Telephone Encounter (Signed)
Dr. Luetta Nutting would like to talk to you about the referral placed to [facility]. He would like a callback on his cell# [phone].

## 2014-10-19 ENCOUNTER — Encounter: Payer: Self-pay | Admitting: Acute Care

## 2014-10-19 ENCOUNTER — Ambulatory Visit (INDEPENDENT_AMBULATORY_CARE_PROVIDER_SITE_OTHER): Payer: 59 | Admitting: Acute Care

## 2014-10-19 DIAGNOSIS — Z87891 Personal history of nicotine dependence: Secondary | ICD-10-CM

## 2014-10-19 NOTE — Progress Notes (Signed)
Shared Decision Making Visit Lung Cancer Screening Program [phone])   Eligibility:  Age 59 y.o.  Pack Years Smoking History Calculation 42 pack years (# packs/per year x # years smoked)  Recent History of coughing up blood  no  Unexplained weight loss? no ( >Than 15 pounds within the last 6 months )  Prior History Lung / other cancer no (Diagnosis within the last 5 years already requiring surveillance chest CT Scans).  Smoking Status Current Smoker  Former Smokers: Years since quit: N/A  Quit Date: N/A  Visit Components:  Discussion included one or more decision making aids. yes  Discussion included risk/benefits of screening. yes  Discussion included potential follow up diagnostic testing for abnormal scans. yes  Discussion included meaning and risk of over diagnosis. yes  Discussion included meaning and risk of False Positives. yes  Discussion included meaning of total radiation exposure. yes  Counseling Included:  Importance of adherence to annual lung cancer LDCT screening. yes  Impact of comorbidities on ability to participate in the program. yes  Ability and willingness to under diagnostic treatment. yes  Smoking Cessation Counseling:  Current Smokers:   Discussed importance of smoking cessation. yes  Information about tobacco cessation classes and interventions provided to patient. yes  Patient provided with "ticket" for LDCT Scan. yes  Symptomatic Patient. no  Counseling: N/A  Diagnosis Code: Tobacco Use Z72.0  Asymptomatic Patient yes  Counseling (Intermediate counseling: > three minutes counseling) U0454  Former Smokers: N/A  Discussed the importance of maintaining cigarette abstinence.N/A  Diagnosis Code: Personal History of Nicotine Dependence. U98.119  Information about tobacco cessation classes and interventions provided to patient. N/A  Patient provided with "ticket" for LDCT Scan.N/A  Written Order for Lung Cancer Screening with  LDCT placed in Epic. Yes (CT Chest Lung Cancer Screening Low Dose W/O CM) [id number] Z12.2-Screening of respiratory organs Z87.891-Personal history of nicotine dependence  I spent 20 minutes of face to face time with this patient explaining the risks and benefits of the lung cancer screening program. We discussed that the single most powerful thing he could do to decrease his risk of lung cancer was to quit smoking. I gave him a " Be stronger than your excuses" card, and told him I am here to help him reach a smoke free goal in any way I can . He has reduced his smoking to 1/3 of a pack per day and has already noticed that he is less short of breath.We spoke about how setting small goals can make a large goal seem more achievable.Mr. Cannedy understands that this is an annual screening, and we will call him each year to schedule it.He has my contact information and a copy of the power point we viewed together. We stopped at intervals to allow for questions and discussion as appropriate. Mr. Adduci verbalized understanding and had no further questions. He is scheduled for a scan tomorrow at [place]. We reviewed the time and location of the scan, and he verbalized understanding. I also reviewed this information with his mother who will be driving him to the appointment.  Magdalen Spatz, NP

## 2014-10-20 ENCOUNTER — Ambulatory Visit
Admission: RE | Admit: 2014-10-20 | Discharge: 2014-10-20 | Disposition: A | Discharge status: Home / Self Care | Payer: 59 | Source: Ambulatory Visit | Attending: Internal Medicine | Admitting: Internal Medicine

## 2014-10-20 ENCOUNTER — Ambulatory Visit
Admission: RE | Admit: 2014-10-20 | Discharge: 2014-10-20 | Disposition: A | Discharge status: Home / Self Care | Payer: No Typology Code available for payment source | Source: Ambulatory Visit | Attending: Internal Medicine | Admitting: Internal Medicine

## 2014-10-20 ENCOUNTER — Telehealth: Payer: Self-pay | Admitting: Acute Care

## 2014-10-20 DIAGNOSIS — K7030 Alcoholic cirrhosis of liver without ascites: Secondary | ICD-10-CM

## 2014-10-20 DIAGNOSIS — B182 Chronic viral hepatitis C: Secondary | ICD-10-CM

## 2014-10-20 DIAGNOSIS — Z720 Tobacco use: Secondary | ICD-10-CM

## 2014-10-20 NOTE — Telephone Encounter (Signed)
I attempted to call Joshua [name] with the results of the screening CT he had this morning. There was no answer at his number. I left a message requesting that he return my call for the results of his scan, with my contact information. I will wait for his return call.

## 2014-10-21 ENCOUNTER — Telehealth: Payer: Self-pay | Admitting: Acute Care

## 2014-10-21 NOTE — Telephone Encounter (Signed)
I did not receive a return call from Joshua [name] yesterday, so I have attempted contact again today. I again left another message asking that he call me for his scan results. I will await a return call.

## 2014-10-25 ENCOUNTER — Telehealth: Payer: Self-pay | Admitting: Acute Care

## 2014-10-25 NOTE — Telephone Encounter (Signed)
I have spoken with Joshua [name] about the results of his LDCT Scan. I explained that there are currently no nodules that are concerning for lung cancer.( Lung RADS 2). He verbalized understanding. I have told him that I will call him to schedule his next scan June of 2017. He verbalized understanding. I will communicate the results of the scan to Dr. Burnice Logan, the patient's PCP.

## 2014-11-09 ENCOUNTER — Encounter: Payer: Self-pay | Admitting: Internal Medicine

## 2014-11-09 ENCOUNTER — Ambulatory Visit (INDEPENDENT_AMBULATORY_CARE_PROVIDER_SITE_OTHER): Payer: 59 | Admitting: Internal Medicine

## 2014-11-09 VITALS — BP 148/90 | HR 79 | Temp 98.0°F | Resp 18 | Ht 71.0 in | Wt 153.0 lb

## 2014-11-09 DIAGNOSIS — I10 Essential (primary) hypertension: Secondary | ICD-10-CM | POA: Diagnosis not present

## 2014-11-09 DIAGNOSIS — R160 Hepatomegaly, not elsewhere classified: Secondary | ICD-10-CM

## 2014-11-09 NOTE — Progress Notes (Signed)
Pre visit review using our clinic review tool, if applicable. No additional management support is needed unless otherwise documented below in the visit note. 

## 2014-11-09 NOTE — Patient Instructions (Signed)
Limit your sodium (Salt) intake  Please check your blood pressure on a regular basis.  If it is consistently greater than 150/90, please make an office appointment.    It is important that you exercise regularly, at least 20 minutes 3 to 4 times per week.  If you develop chest pain or shortness of breath seek  medical attention.  Smoking tobacco is very bad for your health. You should stop smoking immediately.  Return in 3 months for follow-up  

## 2014-11-09 NOTE — Progress Notes (Signed)
Subjective:    Patient ID: Joshua [name], male    DOB: 17-Apr-1956, 59 y.o.   MRN: [id number]  HPI 59 year old patient who is seen today for follow-up of hypertension.  Blood pressure medications have been resumed.  He is on combination therapy.  He has been fairly compliant but states he missed 2 or 3 dosages over the past 4 weeks.  He generally feels well. He has been seen at the chronic hepatitis C clinic in evaluation is in progress He had a load dose screening chest CT that was negative  Past Medical History  Diagnosis Date  . Hepatitis C   . Hypertension   . Tobacco use   . Chronic alcoholism   . Umbilical hernia   . Glucose intolerance (impaired glucose tolerance)   . Depression   . Cirrhosis     History   Social History  . Marital Status: Divorced    Spouse Name: N/A  . Number of Children: N/A  . Years of Education: N/A   Occupational History  . Not on file.   Social History Main Topics  . Smoking status: Current Every Day Smoker -- 1.00 packs/day for 42 years    Types: Cigarettes    Start date: 05/21/1971  . Smokeless tobacco: Never Used     Comment: Has cut down to about 1/3 pack per day  . Alcohol Use: 16.8 oz/week    28 Cans of beer per week  . Drug Use: No  . Sexual Activity: Not on file   Other Topics Concern  . Not on file   Social History Narrative    Past Surgical History  Procedure Laterality Date  . Cataract extraction    . Inguinal hernia repair    . Umbilical hernia repair    . Colonoscopy  12/09  . Left orchiectomy  07/2009    Family History  Problem Relation Age of Onset  . Healthy Mother   . Colon cancer Neg Hx   . Stomach cancer Neg Hx     No Known Allergies  Current Outpatient Prescriptions on File Prior to Visit  Medication Sig Dispense Refill  . b complex vitamins capsule Take 1 capsule by mouth daily.    Marland Kitchen lisinopril-hydrochlorothiazide (PRINZIDE,ZESTORETIC) 10-12.5 MG per tablet Take 1 tablet by mouth daily. 90 tablet  2  . Multiple Vitamin (MULTIVITAMIN) tablet Take 1 tablet by mouth daily.    . sertraline (ZOLOFT) 100 MG tablet Take 1 tablet (100 mg total) by mouth daily. 90 tablet 2  . sildenafil (VIAGRA) 100 MG tablet Take 1 tablet (100 mg total) by mouth daily as needed for erectile dysfunction. 6 tablet 4  . traZODone (DESYREL) 50 MG tablet Take 2 tablets (100 mg total) by mouth at bedtime. 180 tablet 1   No current facility-administered medications on file prior to visit.    BP 148/90 mmHg  Pulse 79  Temp(Src) 98 F (36.7 C) (Oral)  Resp 18  Ht 5\' 11"  (1.803 m)  Wt 153 lb (69.4 kg)  BMI 21.35 kg/m2  SpO2 97%      Review of Systems  Constitutional: Negative for fever, chills, appetite change and fatigue.  HENT: Negative for congestion, dental problem, ear pain, hearing loss, sore throat, tinnitus, trouble swallowing and voice change.   Eyes: Negative for pain, discharge and visual disturbance.  Respiratory: Negative for cough, chest tightness, wheezing and stridor.   Cardiovascular: Negative for chest pain, palpitations and leg swelling.  Gastrointestinal: Negative for nausea, vomiting,  abdominal pain, diarrhea, constipation, blood in stool and abdominal distention.  Genitourinary: Negative for urgency, hematuria, flank pain, discharge, difficulty urinating and genital sores.  Musculoskeletal: Negative for myalgias, back pain, joint swelling, arthralgias, gait problem and neck stiffness.  Skin: Negative for rash.  Neurological: Negative for dizziness, syncope, speech difficulty, weakness, numbness and headaches.  Hematological: Negative for adenopathy. Does not bruise/bleed easily.  Psychiatric/Behavioral: Negative for behavioral problems and dysphoric mood. The patient is not nervous/anxious.        Objective:   Physical Exam  Constitutional: He appears well-developed and well-nourished. No distress.  Blood pressure 150/90          Assessment & Plan:   Hypertension.   Improved compliance and low salt diet stressed.  Home blood pressure monitoring.  Encouraged.  Recheck 3 months Tobacco abuse.  He states that he has cut back.  Total smoking cessation encouraged Cirrhosis follow-up chronic hepatitis C clinic

## 2014-11-25 ENCOUNTER — Telehealth: Payer: Self-pay | Admitting: Internal Medicine

## 2014-11-25 NOTE — Telephone Encounter (Signed)
Please advise 

## 2014-11-25 NOTE — Telephone Encounter (Signed)
Dr. Luetta Nutting called wanting to request the lab results from 3 weeks ago.

## 2014-12-14 ENCOUNTER — Encounter: Payer: Self-pay | Admitting: Internal Medicine

## 2014-12-14 ENCOUNTER — Ambulatory Visit (INDEPENDENT_AMBULATORY_CARE_PROVIDER_SITE_OTHER): Payer: 59 | Admitting: Internal Medicine

## 2014-12-14 DIAGNOSIS — W540XXA Bitten by dog, initial encounter: Secondary | ICD-10-CM | POA: Diagnosis not present

## 2014-12-14 DIAGNOSIS — S41159A Open bite of unspecified upper arm, initial encounter: Secondary | ICD-10-CM | POA: Diagnosis not present

## 2014-12-14 DIAGNOSIS — I10 Essential (primary) hypertension: Secondary | ICD-10-CM

## 2014-12-14 DIAGNOSIS — Z23 Encounter for immunization: Secondary | ICD-10-CM

## 2014-12-14 MED ORDER — AMOXICILLIN-POT CLAVULANATE 875-125 MG PO TABS: 1.0000 | ORAL_TABLET | Freq: Two times a day (BID) | ORAL | Status: DC

## 2014-12-14 NOTE — Addendum Note (Signed)
Addended by: Marian Sorrow on: 12/14/2014 01:45 PM   Modules accepted: Orders

## 2014-12-14 NOTE — Progress Notes (Signed)
Pre visit review using our clinic review tool, if applicable. No additional management support is needed unless otherwise documented below in the visit note. 

## 2014-12-14 NOTE — Progress Notes (Signed)
Subjective:    Patient ID: Joshua [name], male    DOB: October 23, 1955, 59 y.o.   MRN: [id number]  HPI  59 year old patient who is seen today following a dog bite to the right lower arm earlier today. Patient knows the owner of the domesticated dogs who states that the dog was up-to-date on all immunizations.  The dog was in the owner's backyard when the patient attempted to cross the property. Needs a Td immunization  Past Medical History  Diagnosis Date  . Hepatitis C   . Hypertension   . Tobacco use   . Chronic alcoholism   . Umbilical hernia   . Glucose intolerance (impaired glucose tolerance)   . Depression   . Cirrhosis     History   Social History  . Marital Status: Divorced    Spouse Name: N/A  . Number of Children: N/A  . Years of Education: N/A   Occupational History  . Not on file.   Social History Main Topics  . Smoking status: Current Every Day Smoker -- 1.00 packs/day for 42 years    Types: Cigarettes    Start date: 05/21/1971  . Smokeless tobacco: Never Used     Comment: Has cut down to about 1/3 pack per day  . Alcohol Use: 16.8 oz/week    28 Cans of beer per week  . Drug Use: No  . Sexual Activity: Not on file   Other Topics Concern  . Not on file   Social History Narrative    Past Surgical History  Procedure Laterality Date  . Cataract extraction    . Inguinal hernia repair    . Umbilical hernia repair    . Colonoscopy  12/09  . Left orchiectomy  07/2009    Family History  Problem Relation Age of Onset  . Healthy Mother   . Colon cancer Neg Hx   . Stomach cancer Neg Hx     No Known Allergies  Current Outpatient Prescriptions on File Prior to Visit  Medication Sig Dispense Refill  . b complex vitamins capsule Take 1 capsule by mouth daily.    Marland Kitchen lisinopril-hydrochlorothiazide (PRINZIDE,ZESTORETIC) 10-12.5 MG per tablet Take 1 tablet by mouth daily. 90 tablet 2  . Multiple Vitamin (MULTIVITAMIN) tablet Take 1 tablet by mouth daily.      . sertraline (ZOLOFT) 100 MG tablet Take 1 tablet (100 mg total) by mouth daily. 90 tablet 2  . sildenafil (VIAGRA) 100 MG tablet Take 1 tablet (100 mg total) by mouth daily as needed for erectile dysfunction. 6 tablet 4  . traZODone (DESYREL) 50 MG tablet Take 2 tablets (100 mg total) by mouth at bedtime. 180 tablet 1   No current facility-administered medications on file prior to visit.    BP 138/80 mmHg  Pulse 94  Temp(Src) 100.1 F (37.8 C) (Oral)  Resp 18  Ht 5\' 11"  (1.803 m)  Wt 154 lb (69.854 kg)  BMI 21.49 kg/m2  SpO2 94%     Review of Systems  Skin: Positive for wound.       Objective:   Physical Exam  Constitutional: He appears well-developed and well-nourished. No distress.  Tension 100.1 degrees  Skin:  Wound examination, right lower forearm region 2 puncture wounds surrounded by abrasions and ecchymoses. Scratches  is also noted involving his left anterior lower leg          Assessment & Plan:   Dog bite , right lower arm.  The wound was aggressively cleaned  and irrigated.  Local antibiotic ointment applied and the wound dressed We'll treat with 10 days of Augmentin The patient reportedly clinical worsening with increasing pain, redness or drainage  Fever.  Unclear etiology.  Doubt this is secondary to the acute dog bite.  Will observe

## 2014-12-14 NOTE — Patient Instructions (Signed)
Take your antibiotic as prescribed until ALL of it is gone, but stop if you develop a rash, swelling, or any side effects of the medication.  Contact our office as soon as possible if  there are side effects of the medication.  Animal Bite An animal bite can result in a scratch on the skin, deep open cut, puncture of the skin, crush injury, or tearing away of the skin or a body part. Dogs are responsible for most animal bites. Children are bitten more often than adults. An animal bite can range from very mild to more serious. A small bite from your house pet is no cause for alarm. However, some animal bites can become infected or injure a bone or other tissue. You must seek medical care if:  The skin is broken and bleeding does not slow down or stop after 15 minutes.  The puncture is deep and difficult to clean (such as a cat bite).  Pain, warmth, redness, or pus develops around the wound.  The bite is from a stray animal or rodent. There may be a risk of rabies infection.  The bite is from a snake, raccoon, skunk, fox, coyote, or bat. There may be a risk of rabies infection.  The person bitten has a chronic illness such as diabetes, liver disease, or cancer, or the person takes medicine that lowers the immune system.  There is concern about the location and severity of the bite. It is important to clean and protect an animal bite wound right away to prevent infection. Follow these steps:  Clean the wound with plenty of water and soap.  Apply an antibiotic cream.  Apply gentle pressure over the wound with a clean towel or gauze to slow or stop bleeding.  Elevate the affected area above the heart to help stop any bleeding.  Seek medical care. Getting medical care within 8 hours of the animal bite leads to the best possible outcome. DIAGNOSIS  Your caregiver will most likely:  Take a detailed history of the animal and the bite injury.  Perform a wound exam.  Take your medical  history. Blood tests or X-rays may be performed. Sometimes, infected bite wounds are cultured and sent to a lab to identify the infectious bacteria.  TREATMENT  Medical treatment will depend on the location and type of animal bite as well as the patient's medical history. Treatment may include:  Wound care, such as cleaning and flushing the wound with saline solution, bandaging, and elevating the affected area.  Antibiotics.  Tetanus immunization.  Rabies immunization.  Leaving the wound open to heal. This is often done with animal bites, due to the high risk of infection. However, in certain cases, wound closure with stitches, wound adhesive, skin adhesive strips, or staples may be used. Infected bites that are left untreated may require intravenous (IV) antibiotics and surgical treatment in the hospital. [place]  Follow your caregiver's instructions for wound care.  Take all medicines as directed.  If your caregiver prescribes antibiotics, take them as directed. Finish them even if you start to feel better.  Follow up with your caregiver for further exams or immunizations as directed. You may need a tetanus shot if:  You cannot remember when you had your last tetanus shot.  You have never had a tetanus shot.  The injury broke your skin. If you get a tetanus shot, your arm may swell, get red, and feel warm to the touch. This is common and not a  problem. If you need a tetanus shot and you choose not to have one, there is a rare chance of getting tetanus. Sickness from tetanus can be serious. SEEK MEDICAL CARE IF:  You notice warmth, redness, soreness, swelling, pus discharge, or a bad smell coming from the wound.  You have a red line on the skin coming from the wound.  You have a fever, chills, or a general ill feeling.  You have nausea or vomiting.  You have continued or worsening pain.  You have trouble moving the injured part.  You have other questions  or concerns. MAKE SURE YOU:  Understand these instructions.  Will watch your condition.  Will get help right away if you are not doing well or get worse. Document Released: 01/16/2011 Document Revised: 07/23/2011 Document Reviewed: 01/16/2011 [facility] Patient Information 2015 [place], [place]. This information is not intended to replace advice given to you by your health care provider. Make sure you discuss any questions you have with your health care provider.

## 2015-02-09 ENCOUNTER — Ambulatory Visit: Payer: 59 | Admitting: Internal Medicine

## 2015-06-14 ENCOUNTER — Other Ambulatory Visit: Payer: Self-pay | Admitting: Internal Medicine

## 2015-06-14 DIAGNOSIS — M25539 Pain in unspecified wrist: Secondary | ICD-10-CM

## 2015-08-02 ENCOUNTER — Other Ambulatory Visit: Payer: Self-pay | Admitting: Acute Care

## 2015-08-02 DIAGNOSIS — F17210 Nicotine dependence, cigarettes, uncomplicated: Secondary | ICD-10-CM

## 2015-08-03 ENCOUNTER — Other Ambulatory Visit: Payer: Self-pay | Admitting: Orthopedic Surgery

## 2015-08-25 ENCOUNTER — Other Ambulatory Visit: Payer: Self-pay

## 2015-08-25 MED ORDER — TRAZODONE HCL 50 MG PO TABS: 100.0000 mg | ORAL_TABLET | Freq: Every day | ORAL | Status: DC

## 2015-08-26 ENCOUNTER — Other Ambulatory Visit: Payer: Self-pay | Admitting: Internal Medicine

## 2015-08-26 ENCOUNTER — Telehealth: Payer: Self-pay | Admitting: Internal Medicine

## 2015-08-29 NOTE — Telephone Encounter (Signed)
Rx refill was done on 4/15.

## 2015-08-29 NOTE — Telephone Encounter (Signed)
[facility] Night - Client TELEPHONE [facility] Patient Name: Joshua [name] Gender: Male DOB: 20-Aug-1955 Age: 60 Y 34 M 5 D Return Phone Number: [phone] (Primary) Address: City/State/Zip: [place] Client [facility] Night - Client Client Site [facility] - Night Physician Simonne Martinet Contact Type Call Who Is Calling Patient / Member / Family / Caregiver Call Type Triage / Clinical Relationship To Patient Self Return Phone Number [phone] (Primary) Chief Complaint Prescription Refill or Medication Request (non symptomatic) Reason for Call Symptomatic / Request for Health Information Initial Comment caller states he needs an rx refill Translation No Nurse Assessment Nurse: Ronnald Ramp, RN, Miranda Date/Time (Eastern Time): 08/26/2015 9:54:55 AM Please select the assessment type ---Refill Additional Documentation ---Caller states needs a refill on Trazadone Does the patient have enough medication to last until the office opens? ---No Additional Documentation ---Told caller he would need to contact the pharmacy for an emergency supply and then call the office back on Monday. Guidelines Guideline Title Affirmed Question Affirmed Notes Nurse Date/Time (Eastern Time) Disp. Time Eilene Ghazi Time) Disposition Final User 08/26/2015 9:55:41 AM Clinical Call Yes Ronnald Ramp, RN, Jeannetta Nap

## 2016-02-14 ENCOUNTER — Encounter: Payer: Self-pay | Admitting: Acute Care

## 2016-04-18 ENCOUNTER — Telehealth: Payer: Self-pay | Admitting: Internal Medicine

## 2016-04-18 ENCOUNTER — Other Ambulatory Visit: Payer: Self-pay | Admitting: Internal Medicine

## 2016-04-18 NOTE — Telephone Encounter (Signed)
Joshua [name], there is no appointments if pt is having a problem can schedule acute visit.

## 2016-04-18 NOTE — Telephone Encounter (Signed)
Dr Luetta Nutting would like you to give him a call. He wanted to ask for a cpe for the pt. But I advised Dr Raliegh Ip is booked until Jan. (late)  He said go ahead and make appt, but thought I would route to you first.  Please advise.

## 2016-05-29 ENCOUNTER — Other Ambulatory Visit: Payer: Self-pay | Admitting: Internal Medicine

## 2016-06-28 ENCOUNTER — Encounter: Payer: Self-pay | Admitting: Internal Medicine

## 2016-06-28 ENCOUNTER — Ambulatory Visit (INDEPENDENT_AMBULATORY_CARE_PROVIDER_SITE_OTHER): Payer: BLUE CROSS/BLUE SHIELD | Admitting: Internal Medicine

## 2016-06-28 VITALS — BP 146/68 | HR 86 | Temp 98.0°F | Ht 70.0 in | Wt 153.2 lb

## 2016-06-28 DIAGNOSIS — B182 Chronic viral hepatitis C: Secondary | ICD-10-CM | POA: Diagnosis not present

## 2016-06-28 DIAGNOSIS — F341 Dysthymic disorder: Secondary | ICD-10-CM | POA: Diagnosis not present

## 2016-06-28 DIAGNOSIS — K7030 Alcoholic cirrhosis of liver without ascites: Secondary | ICD-10-CM | POA: Diagnosis not present

## 2016-06-28 DIAGNOSIS — Z0000 Encounter for general adult medical examination without abnormal findings: Secondary | ICD-10-CM | POA: Diagnosis not present

## 2016-06-28 DIAGNOSIS — I10 Essential (primary) hypertension: Secondary | ICD-10-CM

## 2016-06-28 LAB — CBC WITH DIFFERENTIAL/PLATELET
BASOS ABS: 0.1 10*3/uL (ref 0.0–0.1)
Basophils Relative: 1.1 % (ref 0.0–3.0)
EOS ABS: 0.2 10*3/uL (ref 0.0–0.7)
Eosinophils Relative: 2.7 % (ref 0.0–5.0)
HEMATOCRIT: 42 % (ref 39.0–52.0)
HEMOGLOBIN: 14.3 g/dL (ref 13.0–17.0)
LYMPHS PCT: 15.3 % (ref 12.0–46.0)
Lymphs Abs: 1.3 10*3/uL (ref 0.7–4.0)
MCHC: 34 g/dL (ref 30.0–36.0)
MCV: 102.8 fl — ABNORMAL HIGH (ref 78.0–100.0)
Monocytes Absolute: 1.5 10*3/uL — ABNORMAL HIGH (ref 0.1–1.0)
Monocytes Relative: 18.1 % — ABNORMAL HIGH (ref 3.0–12.0)
Neutro Abs: 5.2 10*3/uL (ref 1.4–7.7)
Neutrophils Relative %: 62.8 % (ref 43.0–77.0)
Platelets: 237 10*3/uL (ref 150.0–400.0)
RBC: 4.08 Mil/uL — AB (ref 4.22–5.81)
RDW: 14.4 % (ref 11.5–15.5)
WBC: 8.3 10*3/uL (ref 4.0–10.5)

## 2016-06-28 LAB — COMPREHENSIVE METABOLIC PANEL
ALBUMIN: 3.8 g/dL (ref 3.5–5.2)
ALT: 41 U/L (ref 0–53)
AST: 77 U/L — AB (ref 0–37)
Alkaline Phosphatase: 174 U/L — ABNORMAL HIGH (ref 39–117)
BILIRUBIN TOTAL: 0.7 mg/dL (ref 0.2–1.2)
BUN: 14 mg/dL (ref 6–23)
CALCIUM: 8.9 mg/dL (ref 8.4–10.5)
CO2: 29 mEq/L (ref 19–32)
CREATININE: 0.56 mg/dL (ref 0.40–1.50)
Chloride: 104 mEq/L (ref 96–112)
GFR: 158.03 mL/min (ref >60.00)
Glucose, Bld: 109 mg/dL — ABNORMAL HIGH (ref 70–99)
Potassium: 4.3 mEq/L (ref 3.5–5.1)
Sodium: 137 mEq/L (ref 135–145)
Total Protein: 7.3 g/dL (ref 6.0–8.3)

## 2016-06-28 LAB — LIPID PANEL
CHOLESTEROL: 177 mg/dL (ref 0–200)
HDL: 78 mg/dL (ref >39.00)
LDL CALC: 90 mg/dL (ref 0–99)
NonHDL: 99.42
TRIGLYCERIDES: 48 mg/dL (ref 0.0–149.0)
Total CHOL/HDL Ratio: 2
VLDL: 9.6 mg/dL (ref 0.0–40.0)

## 2016-06-28 LAB — TSH: TSH: 2.13 u[IU]/mL (ref 0.35–4.50)

## 2016-06-28 NOTE — Progress Notes (Signed)
Pre visit review using our clinic review tool, if applicable. No additional management support is needed unless otherwise documented below in the visit note. 

## 2016-06-28 NOTE — Patient Instructions (Signed)
Chronic hepatitis C clinic evaluation as discussed  Discontinue all alcoholic products  Smoking tobacco is very bad for your health. You should stop smoking immediately.  Limit your sodium (Salt) intake  Please check your blood pressure on a regular basis.  If it is consistently greater than 150/90, please make an office appointment.  Return in 3 months for follow-up

## 2016-06-28 NOTE — Progress Notes (Signed)
Subjective:    Patient ID: Joshua [name], male    DOB: [birth year], 61 y.o.   MRN: [id number]  HPI  61 year old patient who is seen today for a preventive health examination  Medical problems include a history of essential hypertension.  He has a history also of anxiety, depression  He has a history of alcoholism, complicated by cirrhosis. He states he consumes 4 beers per weekend only.  He has history of ongoing tobacco use.  States he now smokes less than 1 half pack of cigarettes daily  He has a history of chronic hepatitis C.  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 Chronic alcoholism ([place])   . Cirrhosis ([place])   . Depression   . Glucose intolerance (impaired glucose tolerance)   . Hepatitis C   . Hypertension   . Tobacco use   . Umbilical hernia      Social History   Social History  . Marital status: Divorced    Spouse name: N/A  . Number of children: N/A  . Years of education: N/A   Occupational History  . Not on file.   Social History Main Topics  . Smoking status: Current Every Day Smoker    Packs/day: 1.00    Years: 42.00    Types: Cigarettes    Start date: 05/21/1971  . Smokeless tobacco: Never Used     Comment: Has cut down to about 1/3 pack per day  . Alcohol use 16.8 oz/week    28 Cans of beer per week  . Drug use: No  . Sexual activity: Not on file   Other Topics Concern  . Not on file   Social History Narrative  . No narrative on file    Past Surgical History:  Procedure Laterality Date  . CATARACT EXTRACTION    . COLONOSCOPY  12/09  . INGUINAL HERNIA REPAIR    . left orchiectomy  07/2009  . UMBILICAL HERNIA REPAIR      Family History  Problem Relation Age of Onset  . Healthy Mother   . Colon cancer Neg Hx   . Stomach cancer Neg Hx     No Known Allergies  Current Outpatient Prescriptions on File Prior to Visit   Medication Sig Dispense Refill  . b complex vitamins capsule Take 1 capsule by mouth daily.    Marland Kitchen lisinopril-hydrochlorothiazide (PRINZIDE,ZESTORETIC) 10-12.5 MG tablet TAKE 1 TABLET BY MOUTH EVERY DAY 90 tablet 0  . Multiple Vitamin (MULTIVITAMIN) tablet Take 1 tablet by mouth daily.    . sertraline (ZOLOFT) 100 MG tablet TAKE 1 TABLET BY MOUTH EVERY DAY 90 tablet 0  . sildenafil (VIAGRA) 100 MG tablet Take 1 tablet (100 mg total) by mouth daily as needed for erectile dysfunction. 6 tablet 4  . traZODone (DESYREL) 50 MG tablet TAKE 2 TABLETS BY MOUTH AT BEDTIME 180 tablet 1   No current facility-administered medications on file prior to visit.     BP (!) 146/68 (BP Location: Left Arm, Patient Position: Sitting, Cuff Size: Normal)   Pulse 86   Temp 98 F (36.7 C) (Oral)   Ht 5\' 10"  (1.778 m)   Wt 153 lb 3.2 oz (69.5 kg)   SpO2 97%   BMI 21.98 kg/m     Review of Systems  Constitutional: Negative for appetite change, chills, fatigue and fever.  HENT: Negative for congestion, dental  problem, ear pain, hearing loss, sore throat, tinnitus, trouble swallowing and voice change.   Eyes: Negative for pain, discharge and visual disturbance.  Respiratory: Negative for cough, chest tightness, wheezing and stridor.   Cardiovascular: Negative for chest pain, palpitations and leg swelling.  Gastrointestinal: Negative for abdominal distention, abdominal pain, blood in stool, constipation, diarrhea, nausea and vomiting.  Genitourinary: Negative for difficulty urinating, discharge, flank pain, genital sores, hematuria and urgency.  Musculoskeletal: Negative for arthralgias, back pain, gait problem, joint swelling, myalgias and neck stiffness.       Occasional right wrist pain  Skin: Negative for rash.  Neurological: Negative for dizziness, syncope, speech difficulty, weakness, numbness and headaches.  Hematological: Negative for adenopathy. Does not bruise/bleed easily.  Psychiatric/Behavioral:  Negative for behavioral problems and dysphoric mood. The patient is not nervous/anxious.        Objective:   Physical Exam  Constitutional: He appears well-developed and well-nourished.  Blood pressure 140/80  HENT:  Head: Normocephalic and atraumatic.  Right Ear: External ear normal.  Left Ear: External ear normal.  Nose: Nose normal.  Mouth/Throat: Oropharynx is clear and moist.  Dentures in place  Eyes: Conjunctivae and EOM are normal. Pupils are equal, round, and reactive to light. No scleral icterus.  Neck: Normal range of motion. Neck supple. No JVD present. No thyromegaly present.  Cardiovascular: Regular rhythm, normal heart sounds and intact distal pulses.  Exam reveals no gallop and no friction rub.   No murmur heard. Bilateral femoral bruits The left posterior tibial and the right dorsalis pedis pulse difficult to palpate  Pulmonary/Chest: Effort normal and breath sounds normal. He exhibits no tenderness.  Abdominal: Soft. Bowel sounds are normal. He exhibits no distension and no mass. There is no tenderness.  Prominent hepatomegaly No definite splenomegaly  The umbilical scar  Genitourinary: Penis normal. Rectal exam shows guaiac positive stool.  Genitourinary Comments: Prostate minimally enlarged Stool heme positive  Single testicle  Musculoskeletal: Normal range of motion. He exhibits no edema or tenderness.  Lymphadenopathy:    He has no cervical adenopathy.  Neurological: He is alert. He has normal reflexes. No cranial nerve deficit. Coordination normal.  Skin: Skin is warm and dry. No rash noted.  Scattered scarring ecchymoses over the arms  Spider angiomata and telangiectasias over the anterior chest and upper back  Mild digital clubbing  Psychiatric: He has a normal mood and affect. His behavior is normal.          Assessment & Plan:   Preventive health examination Alcoholic cirrhosis.  Total abstinence from all alcohol.  Discussed and  encouraged Chronic hepatitis C.  Will screen with alpha-fetoprotein and abdominal ultrasound.  Schedule follow-up with ID/chronic hepatitis C, clinic Essential hypertension Anxiety, depression Hematest positive stool.  We'll review his CBC.  May need GI referral total cessation of all alcoholic products.  Discussed Tobacco abuse.  Total smoking cessation encouraged  Follow-up 3 months  Robley Matassa Pilar Plate

## 2016-06-29 LAB — AFP TUMOR MARKER: AFP TUMOR MARKER: 5.3 ng/mL (ref <6.1)

## 2016-07-12 ENCOUNTER — Ambulatory Visit
Admission: RE | Admit: 2016-07-12 | Discharge: 2016-07-12 | Disposition: A | Discharge status: Home / Self Care | Payer: BLUE CROSS/BLUE SHIELD | Source: Ambulatory Visit | Attending: Internal Medicine | Admitting: Internal Medicine

## 2016-07-12 DIAGNOSIS — B182 Chronic viral hepatitis C: Secondary | ICD-10-CM

## 2016-07-12 DIAGNOSIS — Z0000 Encounter for general adult medical examination without abnormal findings: Secondary | ICD-10-CM

## 2016-07-13 ENCOUNTER — Other Ambulatory Visit: Payer: Self-pay | Admitting: Internal Medicine

## 2016-07-13 DIAGNOSIS — Z77018 Contact with and (suspected) exposure to other hazardous metals: Secondary | ICD-10-CM

## 2016-07-13 DIAGNOSIS — R935 Abnormal findings on diagnostic imaging of other abdominal regions, including retroperitoneum: Secondary | ICD-10-CM

## 2016-07-16 ENCOUNTER — Ambulatory Visit
Admission: RE | Admit: 2016-07-16 | Discharge: 2016-07-16 | Disposition: A | Discharge status: Home / Self Care | Payer: BLUE CROSS/BLUE SHIELD | Source: Ambulatory Visit | Attending: Internal Medicine | Admitting: Internal Medicine

## 2016-07-16 ENCOUNTER — Telehealth: Payer: Self-pay | Admitting: Internal Medicine

## 2016-07-16 DIAGNOSIS — R935 Abnormal findings on diagnostic imaging of other abdominal regions, including retroperitoneum: Secondary | ICD-10-CM

## 2016-07-16 DIAGNOSIS — Z77018 Contact with and (suspected) exposure to other hazardous metals: Secondary | ICD-10-CM

## 2016-07-16 MED ORDER — GADOBENATE DIMEGLUMINE 529 MG/ML IV SOLN
14.0000 mL | Freq: Once | INTRAVENOUS | Status: AC | PRN
  Administered 2016-07-16: 14 mL via INTRAVENOUS

## 2016-07-16 NOTE — Telephone Encounter (Signed)
Called report of MRI, findings c/w hepatocellular CA. Will notify PCP in AM

## 2016-07-17 ENCOUNTER — Other Ambulatory Visit: Payer: Self-pay | Admitting: Internal Medicine

## 2016-07-17 DIAGNOSIS — K769 Liver disease, unspecified: Secondary | ICD-10-CM

## 2016-07-17 NOTE — Telephone Encounter (Signed)
Called and discussed results of MRI; GI and Oncology urgent consults placed

## 2016-08-03 ENCOUNTER — Encounter: Payer: Self-pay | Admitting: Gastroenterology

## 2016-08-03 ENCOUNTER — Ambulatory Visit: Payer: BLUE CROSS/BLUE SHIELD | Admitting: Gastroenterology

## 2016-08-03 ENCOUNTER — Other Ambulatory Visit (INDEPENDENT_AMBULATORY_CARE_PROVIDER_SITE_OTHER): Payer: BLUE CROSS/BLUE SHIELD

## 2016-08-03 ENCOUNTER — Ambulatory Visit (INDEPENDENT_AMBULATORY_CARE_PROVIDER_SITE_OTHER): Payer: BLUE CROSS/BLUE SHIELD | Admitting: Gastroenterology

## 2016-08-03 VITALS — BP 158/88 | HR 82 | Resp 18 | Ht 70.0 in | Wt 156.0 lb

## 2016-08-03 DIAGNOSIS — K7030 Alcoholic cirrhosis of liver without ascites: Secondary | ICD-10-CM

## 2016-08-03 DIAGNOSIS — B182 Chronic viral hepatitis C: Secondary | ICD-10-CM

## 2016-08-03 DIAGNOSIS — R160 Hepatomegaly, not elsewhere classified: Secondary | ICD-10-CM | POA: Diagnosis not present

## 2016-08-03 LAB — PROTIME-INR
INR: 1 ratio (ref 0.8–1.0)
Prothrombin Time: 10.7 s (ref 9.6–13.1)

## 2016-08-03 NOTE — Patient Instructions (Signed)
If you are age 61 or older, your body mass index should be between 23-30. Your Body mass index is 22.38 kg/m. If this is out of the aforementioned range listed, please consider follow up with your Primary Care Provider.  If you are age 35 or younger, your body mass index should be between 19-25. Your Body mass index is 22.38 kg/m. If this is out of the aformentioned range listed, please consider follow up with your Primary Care Provider.   Your physician has requested that you go to the basement for lab work before leaving today.  Thank you for choosing [facility]  Dr Wilfrid Lund III

## 2016-08-03 NOTE — Progress Notes (Signed)
[facility] Consult Note:  History: Joshua [name] 08/03/2016  Referring physician: Nyoka Cowden, MD  Reason for consult/chief complaint: Follow-up   Subjective  HPI:  This 61 year old man was referred by primary care due to recent testing related to cirrhosis and findings of 2 liver masses. He has long-standing alcohol abuse and just quit drinking beer 3 weeks ago. He is here with his mother and stepfather today. Joshua [name] provides little in the way of history, because he says "they haven't told me much". It sounds like he may have been seen by a hepatologist in [place] couple of years ago, but none of them have any recollection of the findings or recommendations, and those reports are not currently available.  Joshua [name] also has chronic hepatitis C with a positive viral load in 2011 and no prior treatment.  He denies abdominal pain, night sweats, fever, chest pain or dyspnea.  ROS:  Review of Systems  Constitutional: Negative for appetite change and unexpected weight change.  HENT: Negative for mouth sores and voice change.   Eyes: Negative for pain and redness.  Respiratory: Negative for cough and shortness of breath.   Cardiovascular: Negative for chest pain and palpitations.  Genitourinary: Negative for dysuria and hematuria.  Musculoskeletal: Negative for arthralgias and myalgias.  Skin: Negative for pallor and rash.  Neurological: Negative for weakness and headaches.  Hematological: Negative for adenopathy.  Psychiatric/Behavioral: The patient is nervous/anxious.      Past Medical History: Past Medical History:  Diagnosis Date  . Chronic alcoholism ([place])   . Cirrhosis ([place])   . Depression   . Glucose intolerance (impaired glucose tolerance)   . Hepatitis C   . Hypertension   . Tobacco use   . Umbilical hernia      Past Surgical History: Past Surgical History:  Procedure Laterality Date  . CATARACT EXTRACTION    .  COLONOSCOPY  12/09  . INGUINAL HERNIA REPAIR    . left orchiectomy  07/2009  . UMBILICAL HERNIA REPAIR       Family History: Family History  Problem Relation Age of Onset  . Healthy Mother   . Colon cancer Neg Hx   . Stomach cancer Neg Hx     Social History: Social History   Social History  . Marital status: Divorced    Spouse name: N/A  . Number of children: N/A  . Years of education: N/A   Social History Main Topics  . Smoking status: Current Every Day Smoker    Packs/day: 1.00    Years: 42.00    Types: Cigarettes    Start date: 05/21/1971  . Smokeless tobacco: Never Used     Comment: Has cut down to about 1/3 pack per day  . Alcohol use 16.8 oz/week    28 Cans of beer per week  . Drug use: No  . Sexual activity: Not Asked   Other Topics Concern  . None   Social History Narrative  . None    Allergies: No Known Allergies  Outpatient Meds: Current Outpatient Prescriptions  Medication Sig Dispense Refill  . b complex vitamins capsule Take 1 capsule by mouth daily.    Marland Kitchen lisinopril-hydrochlorothiazide (PRINZIDE,ZESTORETIC) 10-12.5 MG tablet TAKE 1 TABLET BY MOUTH EVERY DAY 90 tablet 0  . Multiple Vitamin (MULTIVITAMIN) tablet Take 1 tablet by mouth daily.    . sertraline (ZOLOFT) 100 MG tablet TAKE 1 TABLET BY MOUTH EVERY DAY 90 tablet 0  . sildenafil (VIAGRA) 100 MG tablet Take 1  tablet (100 mg total) by mouth daily as needed for erectile dysfunction. 6 tablet 4  . traZODone (DESYREL) 50 MG tablet TAKE 2 TABLETS BY MOUTH AT BEDTIME 180 tablet 1   No current facility-administered medications for this visit.       ___________________________________________________________________ Objective   Exam:  BP (!) 158/88   Pulse 82   Resp 18   Ht 5\' 10"  (1.778 m)   Wt 156 lb (70.8 kg)   BMI 22.38 kg/m    General: this is a(n) Chronically ill-appearing man, alert, conversational, somewhat anxious appearing.   Eyes: sclera anicteric, he has a ruddy  complexion  ENT: oral mucosa moist without lesions, no cervical or supraclavicular lymphadenopathy, good dentition  CV: RRR without murmur, S1/S2, no JVD, no peripheral edema  Resp: clear to auscultation bilaterally, normal RR and effort noted  GI: soft, no tenderness, with active bowel sounds. No spleen tip palpable. His liver is felt at least 4 finger breadths below the costal margin.  Skin; warm and dry, no rash or jaundice noted. Multiple spider nevi on upper chest wall  Neuro: awake, alert and oriented x 3. Normal gross motor function and fluent speech  Labs:  AFP 5.3  CMP Latest Ref Rng & Units 06/28/2016 10/05/2014 03/27/2013  Glucose 70 - 99 mg/dL 109(H) 97 173(H)  BUN 6 - 23 mg/dL 14 9 10   Creatinine 0.40 - 1.50 mg/dL 0.56 0.54 0.6  Sodium 135 - 145 mEq/L 137 138 135  Potassium 3.5 - 5.1 mEq/L 4.3 4.3 4.2  Chloride 96 - 112 mEq/L 104 102 102  CO2 19 - 32 mEq/L 29 27 26   Calcium 8.4 - 10.5 mg/dL 8.9 9.6 9.2  Total Protein 6.0 - 8.3 g/dL 7.3 7.6 7.7  Total Bilirubin 0.2 - 1.2 mg/dL 0.7 0.6 0.5  Alkaline Phos 39 - 117 U/L 174(H) 177(H) 115  AST 0 - 37 U/L 77(H) 94(H) 85(H)  ALT 0 - 53 U/L 41 61(H) 50   CBC    Component Value Date/Time   WBC 8.3 06/28/2016 1012   RBC 4.08 (L) 06/28/2016 1012   HGB 14.3 06/28/2016 1012   HCT 42.0 06/28/2016 1012   PLT 237.0 06/28/2016 1012   MCV 102.8 (H) 06/28/2016 1012   MCHC 34.0 06/28/2016 1012   RDW 14.4 06/28/2016 1012   LYMPHSABS 1.3 06/28/2016 1012   MONOABS 1.5 (H) 06/28/2016 1012   EOSABS 0.2 06/28/2016 1012   BASOSABS 0.1 06/28/2016 1012   In May 2011: hepatitis B surface antigen negative HCV quantitative 1.69 million copies HIV negative  Radiologic Studies: Recent ultrasound findings  Liver: Again noted heterogeneous increased echogenicity of the liver with nodular contour consistent with cirrhosis. There is hypoechoic lesion in right hepatic lobe measures 1.7 x 2.4 cm. There is a second hypoechoic lesion in  inferior aspect of the right hepatic lobe measures 4.6 x 3.6 cm. This is ill defined. Further evaluation with enhanced MRI is recommended to exclude evolving hepatic masses.  No ascites was seen   07/16/2016 MRI report: CLINICAL DATA:  Indeterminate hepatic lesion on ultrasound. MRI recommended for further evaluation for a   EXAM: MRI ABDOMEN WITHOUT AND WITH CONTRAST   TECHNIQUE: Multiplanar multisequence MR imaging of the abdomen was performed both before and after the administration of intravenous contrast.   CONTRAST:  47mL MULTIHANCE GADOBENATE DIMEGLUMINE 529 MG/ML IV SOLN   COMPARISON:  Ultrasound 07/12/2016, 02/06/2009 MRI.   FINDINGS: Lower chest:  Lung bases are clear.   Hepatobiliary: Early enhancing  lesion in the RIGHT hepatic lobe (segment 8) measures 17 mm (image 19, series 12). This lesion has rapid washout and a pseudo capsule (image 25, series 15 ).   Similar lesion in the inferior RIGHT hepatic lobe (segment 6) measures 19 mm (image 59, series 12). This lesion also has delayed peripheral enhancement (image 65, series 14).   The liver has a nodular contour. There is enlargement of the caudate lobe. The LEFT hepatic lobe is shrunken.   Portal vein is patent.  No ascites   Pancreas: Normal pancreatic parenchymal intensity. No ductal dilatation or inflammation.   Spleen: Normal spleen.   Adrenals/urinary tract: Adrenal glands and kidneys are normal.   Stomach/Bowel: Stomach and limited of the small bowel is unremarkable   Vascular/Lymphatic: Abdominal aortic normal caliber. No retroperitoneal periportal lymphadenopathy.   Musculoskeletal: No aggressive osseous lesion   IMPRESSION: 1. Two enhancing lesions in the RIGHT hepatic lobe on the background of hepatitis-C and liver cirrhosis are consistent with hepatocellular carcinoma. Recommend multidisciplinary GI, surgical and oncological consultation. 2. Morphologic changes of cirrhosis. 3. No  ascites.  Patent portal veins. These results will be called to the ordering clinician or representative by the Radiologist Assistant, and communication documented in the PACS or zVision Dashboard.     Electronically Signed   By: Suzy Bouchard M.D.   On: 07/16/2016 18:12  Assessment: Encounter Diagnoses  Name Primary?  . Alcoholic cirrhosis of liver without ascites ([place]) Yes  . Chronic hepatitis C without hepatic coma ([place])   . Mass of multiple sites of liver     Lesions are reportedly worrisome for hepatocellular carcinoma in a patient with cirrhosis.  His liver disease otherwise appears compensated at this time.  He'll most likely need a biopsy, but I think it would be best to present this case at tumor board next week radiology can comment on the feasibility of CT-guided biopsy.  I sent him to the lab for PT/INR and updated hepatitis C testing, although treatment for that needs to wait at the present time.  I will contact him with the further plan next week.   Thank you for the courtesy of this consult.  Please call me with any questions or concerns.  Nelida Meuse III  CC: Nyoka Cowden, MD

## 2016-08-06 ENCOUNTER — Encounter: Payer: Self-pay | Admitting: Genetics

## 2016-08-07 LAB — HEPATITIS C RNA QUANTITATIVE
HCV Quantitative Log: 6 Log IU/mL — ABNORMAL HIGH
HCV Quantitative: 998000 IU/mL — ABNORMAL HIGH

## 2016-08-07 LAB — HEPATITIS C GENOTYPE

## 2016-08-08 ENCOUNTER — Telehealth: Payer: Self-pay | Admitting: Gastroenterology

## 2016-08-08 NOTE — Telephone Encounter (Signed)
Joshua [name],  I spoke with Joshua [name] about his lab results, and also presented his case at tumor board today. The consensus is that these 2 liver lesions are consistent with hepatocellular carcinoma and at the best treatment would be microwave ablation interventional radiology. He also will need to see Joshua [name] of oncology within a few weeks to evaluate him formally and also in case other treatments for these tumors are necessary down the road.  Additionally, he needs to be seen by the [facility] hepatology clinic, Joshua Locks, Joshua [name], for his chronic hepatitis C infection. We need to fax them my recent office note, his imaging reports, and labs done after the recent office visit with me. These include CBC, PT/INR, CMP, hepatitis C viral load and genotype. I will also send a staph message to [facility] to alert her that this patient should be seen within a few weeks.  I spoke with Joshua [name] on the phone this evening and updated him on all of this and he is agreeable to the plan.  Please place a referral to interventional radiology for hepatocellular carcinoma, microwave ablation treatment, attention Joshua [name]  Please place a referral to Joshua [name] of oncology  Please place a referral to the hepatology clinic as noted above for chronic hepatitis C and cirrhosis

## 2016-08-09 ENCOUNTER — Other Ambulatory Visit: Payer: Self-pay

## 2016-08-09 ENCOUNTER — Other Ambulatory Visit: Payer: Self-pay | Admitting: Gastroenterology

## 2016-08-09 DIAGNOSIS — C220 Liver cell carcinoma: Secondary | ICD-10-CM

## 2016-08-09 NOTE — Telephone Encounter (Signed)
Information faxed to [facility] to schedule appointment for patient to see [name], NP. Referral placed to oncology to see Dr. Burr Medico, called and left message for their clinical coordinator to call our office back if they do not see referral in their system. Called IR clinic, patient is scheduled to see Dr. Pascal Lux on 4/10. Called and spoke to patient to let him know to expect a call from these two other clinics, he is aware of appointment with Dr. Pascal Lux. Asked patient to call back if he has not heard from the other offices so that I can check on those referral/appointments.

## 2016-08-13 ENCOUNTER — Telehealth: Payer: Self-pay | Admitting: Gastroenterology

## 2016-08-13 NOTE — Telephone Encounter (Signed)
Patient stated that his mom made the call.  He has all of his appt details and needs no additional information.  He will call back if he has additional questions or concerns

## 2016-08-14 ENCOUNTER — Telehealth: Payer: Self-pay

## 2016-08-14 ENCOUNTER — Encounter: Payer: Self-pay | Admitting: Hematology

## 2016-08-14 ENCOUNTER — Telehealth: Payer: Self-pay | Admitting: Hematology

## 2016-08-14 NOTE — Telephone Encounter (Signed)
Received faxed from [facility] that they received information and will contact patient to schedule an appointment.

## 2016-08-14 NOTE — Telephone Encounter (Signed)
Per request of the pt's mother, appt has been moved to 4/13 at 11am. Aware to arrive 30 minutes early.

## 2016-08-14 NOTE — Telephone Encounter (Signed)
[place], spoke to [facility], she was able to see referral but patient was not scheduled yet. She transferred me to clinical coordinator, had to lvm, asked that when they get patient scheduled to call us back and let us know what date his appointment is scheduled.

## 2016-08-14 NOTE — Telephone Encounter (Signed)
Spoke to the pt to schedule an appt for him to see Dr. Burr Medico on 4/13 at 230pm. Pt aware to arrive 30 minutes early. Pt agreed to the appt date and time. Letter mailed.

## 2016-08-21 ENCOUNTER — Other Ambulatory Visit (HOSPITAL_COMMUNITY): Payer: Self-pay | Admitting: Interventional Radiology

## 2016-08-21 ENCOUNTER — Ambulatory Visit
Admission: RE | Admit: 2016-08-21 | Discharge: 2016-08-21 | Disposition: A | Discharge status: Home / Self Care | Payer: BLUE CROSS/BLUE SHIELD | Source: Ambulatory Visit | Attending: Gastroenterology | Admitting: Gastroenterology

## 2016-08-21 ENCOUNTER — Telehealth: Payer: Self-pay

## 2016-08-21 DIAGNOSIS — C220 Liver cell carcinoma: Secondary | ICD-10-CM

## 2016-08-21 DIAGNOSIS — K7030 Alcoholic cirrhosis of liver without ascites: Secondary | ICD-10-CM

## 2016-08-21 HISTORY — PX: IR RADIOLOGIST EVAL & MGMT: IMG5224

## 2016-08-21 NOTE — Consult Note (Signed)
Chief Complaint  Patient presents with  . Advice Only    Consult for Hepatocellular Carcinoma      Referring Physician(s): Danis,Henry L III (GI) Drazek (GI) Burr Medico (Oncology) Barry Dienes (Hepatobilliary Surgery)  History of Present Illness:  Joshua [name] is a 61 y.o. male with past medical history significant for hypertension, smoking, alcoholic cirrhosis and hepatitis C who was found to have indeterminate liver lesions on abdominal ultrasound performed on 07/12/2016 found to be worrisome for areas of hepatocellular carcinoma on contrast enhanced abdominal MRI performed 07/16/04/2018. These findings are associated with a mildly elevated AFP level of 5.3 (obtained on 06/28/2016). Patient presents today to the interventional radiology clinic for evaluation of potential percutaneous treatment options for hepatocellular carcinoma. Patient is accompanied by his mother though serves as his own historian.  The patient is asymptomatic in regards to his hepatocellular carcinoma. Specifically, no unintentional weight loss or weight gain. No change in energy level. The patient remains independent with all activities of daily living. No yellowing of the skin or eyes. No increased abdominal girth. No confusion.     Past Medical History:  Diagnosis Date  . Chronic alcoholism ([place])   . Cirrhosis ([place])   . Depression   . Glucose intolerance (impaired glucose tolerance)   . Hepatitis C   . Hypertension   . Tobacco use   . Umbilical hernia     Past Surgical History:  Procedure Laterality Date  . CATARACT EXTRACTION    . COLONOSCOPY  12/09  . INGUINAL HERNIA REPAIR    . left orchiectomy  07/2009  . UMBILICAL HERNIA REPAIR      Allergies: Patient has no known allergies.  Medications: Prior to Admission medications   Medication Sig Start Date End Date Taking? Authorizing Provider  b complex vitamins capsule Take 1 capsule by mouth daily.    Historical Provider, MD    lisinopril-hydrochlorothiazide (PRINZIDE,ZESTORETIC) 10-12.5 MG tablet TAKE 1 TABLET BY MOUTH EVERY DAY 04/18/16   Marletta Lor, MD  Multiple Vitamin (MULTIVITAMIN) tablet Take 1 tablet by mouth daily.    Historical Provider, MD  sertraline (ZOLOFT) 100 MG tablet TAKE 1 TABLET BY MOUTH EVERY DAY 05/29/16   Marletta Lor, MD  sildenafil (VIAGRA) 100 MG tablet Take 1 tablet (100 mg total) by mouth daily as needed for erectile dysfunction. 07/24/12   Marletta Lor, MD  traZODone (DESYREL) 50 MG tablet TAKE 2 TABLETS BY MOUTH AT BEDTIME 08/27/15   Marletta Lor, MD     Family History  Problem Relation Age of Onset  . Healthy Mother   . Colon cancer Neg Hx   . Stomach cancer Neg Hx     Social History   Social History  . Marital status: Divorced    Spouse name: N/A  . Number of children: N/A  . Years of education: N/A   Social History Main Topics  . Smoking status: Current Every Day Smoker    Packs/day: 1.00    Years: 42.00    Types: Cigarettes    Start date: 05/21/1971  . Smokeless tobacco: Never Used     Comment: Has cut down to about 1/3 pack per day  . Alcohol use 16.8 oz/week    28 Cans of beer per week  . Drug use: No  . Sexual activity: Not on file   Other Topics Concern  . Not on file   Social History Narrative  . No narrative on file    ECOG Status: 0 -  Asymptomatic  Review of Systems: A 12 point ROS discussed and pertinent positives are indicated in the HPI above.  All other systems are negative.  Review of Systems  Constitutional: Negative for activity change, fatigue and unexpected weight change.  Respiratory: Negative.   Cardiovascular: Negative.   Gastrointestinal: Negative.  Negative for abdominal distention and blood in stool.  Skin: Negative.  Negative for color change.  Psychiatric/Behavioral: Negative.  Negative for confusion.    Vital Signs: BP (!) 162/89 (BP Location: Left Arm, Patient Position: Sitting, Cuff Size: Normal)    Pulse 82   Temp 98.7 F (37.1 C) (Oral)   Resp 15   Ht 5\' 11"  (1.803 m)   Wt 158 lb (71.7 kg)   SpO2 98%   BMI 22.04 kg/m   Physical Exam  Constitutional: He appears well-developed and well-nourished.  HENT:  Head: Normocephalic and atraumatic.  Eyes: Conjunctivae are normal.  Cardiovascular: Normal rate, regular rhythm and intact distal pulses.   Easily palpable right common femoral and posterior tibial arterial pulses.  I am unable to palpate the right pedis artery pulse.  Pulmonary/Chest: Effort normal and breath sounds normal.  Abdominal: Soft. Bowel sounds are normal. He exhibits no distension.  Skin: Skin is warm and dry.  Psychiatric: He has a normal mood and affect.  Nursing note and vitals reviewed.   Imaging:  Abdominal ultrasound - 07/12/2016; contrast enhanced abdominal MRI - 07/16/2016  Selected images from abdominal ultrasound and MRI were reviewed in detail with the patient and the patient's mother.  Personal review of contrast enhanced abdominal MRI demonstrates a well-defined approximately 1.7 cm enhancing lesion within the subcapsular aspect of segment 8 of the right lobe of the liver correlating with the hypoechoic nodule seen on preceding abdominal ultrasound.  There is a less well-defined at least 1.9 x 2.3 cm there is enhancing lesion within the caudal aspect of the right lobe of the liver which appears slightly smaller than the serpiginous ill-defined approximately 3.6 x 4.6 x 2.2 cm hypoechoic mass questioned on preceding abdominal ultrasound.  Note is also made of a tiny (approximately 0.6 cm) enhancing nodule within the subcapsular aspect of the posterior segment of the right lobe of the liver (image 57, series 12).  Labs:  CBC:  Recent Labs  06/28/16 1012  WBC 8.3  HGB 14.3  HCT 42.0  PLT 237.0    COAGS:  Recent Labs  08/03/16 1221  INR 1.0    BMP:  Recent Labs  06/28/16 1012  NA 137  K 4.3  CL 104  CO2 29  GLUCOSE 109*  BUN  14  CALCIUM 8.9  CREATININE 0.56    LIVER FUNCTION TESTS:  Recent Labs  06/28/16 1012  BILITOT 0.7  AST 77*  ALT 41  ALKPHOS 174*  PROT 7.3  ALBUMIN 3.8    TUMOR MARKERS:  Recent Labs  06/28/16 1012  AFPTM 5.3    Assessment and Plan:  Joshua [name] is a 61 y.o. male with past medical history significant for hypertension, smoking, alcoholic cirrhosis and hepatitis C who was found to have indeterminate liver lesions on abdominal ultrasound performed on 07/12/2016 found to be worrisome for areas of hepatocellular carcinoma on contrast enhanced abdominal MRI performed 07/16/04/2018.  These findings are associated with a mildly elevated AFP level of 5.3 (obtained on 06/28/2016).    Note, the patient's clinical case has been discussed with [name] who wishes the patient to undergo definitive hepatocellular carcinoma treatment prior to the Hepatitis  C treatment.  I explained to the patient that the goal standard is surgical resection and he would benefit from input from Dr. Barry Dienes regarding his operative candidacy.  Assuming he is not an operative candidate, prolonged conversations were held with the patient regarding potential percutaneous treatment options, including microwave ablation and transcatheter embolization.  Selected images from abdominal ultrasound (obtained on 07/12/2016) and MRI (obtained on 07/16/2016) were reviewed in detail with the patient and the patient's mother.  While both lesions demonstrated imaging characteristics compatible with hepatocellular carcinoma, the well-defined of prostate 1.7 cm enhancing lesion within the subcapsular aspect of segment 8 of the right lobe of the liver correlates well with the hypoechoic nodule seen on preceding abdominal ultrasound.  I am uncertain as to the exact borders, and thus the true size, of the additional ill-defined approximately 1.9 x 2.3 cm enhancing lesion within the caudal aspect of the right lobe of the liver.  Specifically, I am uncertain whether this lesion correlates with the questioned approximately 3.6 x 4.6 x 2.2 cm hypoechoic mass seen on preceding abdominal ultrasound.  I explained to the patient and the patient's mother, that the exact lesion size is important as if this lesion is less than 3 cm, the patient would be a candidate for potential percutaneous microwave ablation. If this lesion proves to be larger than 3 cm, we may have to proceed with catheter directed bland embolization of both lesions prior to a potential ablation.  As such, I will proceed with obtaining a cirrhotic protocol CTA of the abdomen and pelvis to better delineate the patient's hepatic arterial supply (in case embolization is required) as well as to better determine the true size of the ill-defined lesion within the caudal aspect of the right lobe of the liver.  Following the acquisition of this CTA, the patient will return to the interventional radiology clinic (per his request as his father is unable to attend today's appointment), to discuss the ultimate treatment plan.  The patient and the patient's mother workup encouraged to call the interventional radiology clinic with any interval questions or concerns.  Thank you for this interesting consult.  I greatly enjoyed meeting MANOLITO JUREWICZ and look forward to participating in their care.  A copy of this report was sent to the requesting provider on this date.  Electronically Signed: Sandi Mariscal 08/21/2016, 10:37 AM   I spent a total of 30 Minutes in face to face in clinical consultation, greater than 50% of which was counseling/coordinating care for Hepatocellular carcinoma

## 2016-08-21 NOTE — Telephone Encounter (Signed)
Called [facility], they called patient on 4/2 and lvm for patient to call them back to schedule appointment. They said that they received another referral today from Dr. Pascal Lux, I let her know that this is all for the same issue. Called patient to ask him to give them a call, he will call [facility] now to set up an appointment.

## 2016-08-24 ENCOUNTER — Encounter: Payer: Self-pay | Admitting: Hematology

## 2016-08-24 ENCOUNTER — Ambulatory Visit: Payer: BLUE CROSS/BLUE SHIELD | Admitting: Hematology

## 2016-08-24 ENCOUNTER — Ambulatory Visit (HOSPITAL_BASED_OUTPATIENT_CLINIC_OR_DEPARTMENT_OTHER): Payer: BLUE CROSS/BLUE SHIELD | Admitting: Hematology

## 2016-08-24 VITALS — BP 154/84 | HR 79 | Temp 98.7°F | Resp 18 | Ht 71.0 in | Wt 153.4 lb

## 2016-08-24 DIAGNOSIS — K7030 Alcoholic cirrhosis of liver without ascites: Secondary | ICD-10-CM

## 2016-08-24 DIAGNOSIS — R1084 Generalized abdominal pain: Secondary | ICD-10-CM

## 2016-08-24 DIAGNOSIS — C220 Liver cell carcinoma: Secondary | ICD-10-CM | POA: Diagnosis not present

## 2016-08-24 DIAGNOSIS — B182 Chronic viral hepatitis C: Secondary | ICD-10-CM

## 2016-08-24 NOTE — Progress Notes (Signed)
[place]  Telephone:([phone]) [phone] Fax:([phone]) [place] Note   Patient Care Team: Marletta Lor, MD as PCP - General 08/24/2016   referring physician: Dr. Loletha Carrow   CHIEF COMPLAINTS/PURPOSE OF CONSULTATION:  Hepatocellular carcinoma  Oncology History   Cancer Staging Hepatocellular carcinoma [facility]) Staging form: Liver, AJCC 8th Edition - Clinical stage from 08/01/2016: Stage II (cT2(m), cN0, cM0) - Signed by Truitt Merle, MD on 08/24/2016       Hepatocellular carcinoma ([place])   06/28/2016 Tumor Marker    AFP 5.3      07/12/2016 Imaging    US Abdomen 07/12/16 IMPRESSION: 1. There is shadowing gallstone within gallbladder measures 1.6 cm. No sonographic Murphy's sign. No thickening of gallbladder wall. Normal CBD. 2. Again noted heterogeneous increased echogenicity of the liver with nodular contour consistent with cirrhosis. There is hypoechoic lesion in right hepatic lobe measures 1.7 x 2.4 cm. There is a second hypoechoic lesion in inferior aspect of the right hepatic lobe measures 4.6 x 3.6 cm. This is ill defined. Further evaluation with enhanced MRI is recommended to exclude evolving hepatic masses. 3. No hydronephrosis or renal calculi. 4. No aortic aneurysm.      07/16/2016 Imaging    MRI Abdomen w wo Contrast 07/16/16 IMPRESSION: 1. Two enhancing lesions in the RIGHT hepatic lobe on the background of hepatitis-C and liver cirrhosis are consistent with hepatocellular carcinoma. Recommend multidisciplinary GI, surgical and oncological consultation. 2. Morphologic changes of cirrhosis. 3. No ascites.  Patent portal veins.      07/16/2016 Initial Diagnosis    Hepatocellular carcinoma ([place])       HISTORY OF PRESENTING ILLNESS (08/24/2016):  Joshua [name] 61 y.o. male is here because of a new diagnosis of hepatocellular carcinoma.  The patient had an US of the abdomen on 07/12/16 for a personal history of chronic hepatitis-C and  alcoholism complicated by cirrhosis. This a showed a gallstone in the gallbladder measuring 1.6 cm, heterogeneous increased echogenicity of the liver with nodular contour consistent with cirrhosis, a hypoechoic lesion in right hepatic lobe measuring 1.7 x 2.4 cm, and a second ill-defined hypoechoic lesion in inferior aspect of the right hepatic lobe measuring  4.6 x 3.6 cm.  MRI of the abdomen on 07/16/16 showed two enhancing lesions in the right hepatic lobe (1.7 cm and 1.9 cm)  in the background of hepatitis-C and liver cirrhosis consistent with hepatocellular carcinoma. These findings are associated with a mildly elevated AFT level of 5.3 obtained on 06/28/16.  The patient was referred to Dr. Pascal Lux of IR on 08/21/16 to discuss treatment options. He explained that the goal standard is surgical resection and he may benefit from input from Dr. Barry Dienes regarding operative candidacy. If he is not an operative candidate,conversations were held with the patient regarding potential percutaneous treatment options; such as microwave ablation and transcatheter embolization. Dr. Pascal Lux recommended cirrhotic protocol CTA of the abd/pelvis to better delinate the patient's hepatic arterial supply and determine the true size of the ill-defined lesion in the caudal aspect of the right hepatic lobe. He will return afterwards to Dr. Pascal Lux to further discuss his treatment plan. The patient's case was also discussed with [name], NP who wished the patient to undergo definitive hepatocellular carcinoma treatment prior to the hepatitis-C treatment.  The patient presents today with his mother and Dr. Elta Guadeloupe, his step-father, to discuss possible systemic treatments for the management of his disease. He denies nausea, bloating, changes in bowel habits, or abdominal  pain. He used Trazodone to sleep. He denies hematemsis or hematochezia. The patient states his hep-C was an incidental finding. He reports easily bruising.  MEDICAL  HISTORY:  Past Medical History:  Diagnosis Date  . Chronic alcoholism ([place])   . Cirrhosis ([place])   . Depression   . Glucose intolerance (impaired glucose tolerance)   . Hepatitis C   . Hypertension   . Tobacco use   . Umbilical hernia     SURGICAL HISTORY: Past Surgical History:  Procedure Laterality Date  . CATARACT EXTRACTION    . COLONOSCOPY  12/09  . INGUINAL HERNIA REPAIR    . left orchiectomy  07/2009  . UMBILICAL HERNIA REPAIR      SOCIAL HISTORY: Social History   Social History  . Marital status: Single    Spouse name: N/A  . Number of children: N/A  . Years of education: N/A   Occupational History  . Not on file.   Social History Main Topics  . Smoking status: Current Every Day Smoker    Packs/day: 1.00    Years: 42.00    Types: Cigarettes    Start date: 05/21/1971  . Smokeless tobacco: Never Used     Comment: Has cut down to about 1/3 pack per day  . Alcohol use No     Comment: he used to drink beer 6 pack a day since age of 36, stopped in 07/2016  . Drug use: Yes  . Sexual activity: Not on file   Other Topics Concern  . Not on file   Social History Narrative  . No narrative on file   He works as a Hydrologist.  FAMILY HISTORY: Family History  Problem Relation Age of Onset  . Healthy Mother   . Colon cancer Neg Hx   . Stomach cancer Neg Hx     ALLERGIES:  has No Known Allergies.  MEDICATIONS:  Current Outpatient Prescriptions  Medication Sig Dispense Refill  . b complex vitamins capsule Take 1 capsule by mouth daily.    Marland Kitchen lisinopril-hydrochlorothiazide (PRINZIDE,ZESTORETIC) 10-12.5 MG tablet TAKE 1 TABLET BY MOUTH EVERY DAY 90 tablet 0  . Multiple Vitamin (MULTIVITAMIN) tablet Take 1 tablet by mouth daily.    . sertraline (ZOLOFT) 100 MG tablet TAKE 1 TABLET BY MOUTH EVERY DAY 90 tablet 0  . sildenafil (VIAGRA) 100 MG tablet Take 1 tablet (100 mg total) by mouth daily as needed for erectile dysfunction. 6 tablet 4  . traZODone  (DESYREL) 50 MG tablet TAKE 2 TABLETS BY MOUTH AT BEDTIME 180 tablet 1   No current facility-administered medications for this visit.     REVIEW OF SYSTEMS:   Constitutional: Denies fevers, chills or abnormal night sweats Eyes: Denies blurriness of vision, double vision or watery eyes Ears, nose, mouth, throat, and face: Denies mucositis or sore throat Respiratory: Denies cough, dyspnea or wheezes Cardiovascular: Denies palpitation, chest discomfort or lower extremity swelling Gastrointestinal:  Denies nausea, heartburn or change in bowel habits Skin: Denies abnormal skin rashes.  (+) Bruises on his forearms. Lymphatics: Denies new lymphadenopathy or easy bruising Neurological:Denies numbness, tingling or new weaknesses Behavioral/Psych: Mood is stable, no new changes  All other systems were reviewed with the patient and are negative.  PHYSICAL EXAMINATION: ECOG PERFORMANCE STATUS: 0 - Asymptomatic  Vitals:   08/24/16 1136  BP: (!) 154/84  Pulse: 79  Resp: 18  Temp: 98.7 F (37.1 C)   Filed Weights   08/24/16 1136  Weight: 153 lb 6.4 oz (69.6  kg)    GENERAL:alert, no distress and comfortable SKIN: skin color, texture, turgor are normal, no rashes or significant lesions (+) Multiple ecchymoses of his bilateral forearms. EYES: normal, conjunctiva are pink and non-injected, sclera clear OROPHARYNX:no exudate, no erythema and lips, buccal mucosa, and tongue normal  NECK: supple, thyroid normal size, non-tender, without nodularity LYMPH:  no palpable lymphadenopathy in the cervical, axillary or inguinal LUNGS: clear to auscultation and percussion with normal breathing effort HEART: regular rate. (+) systolic murmur in the metrial valve area. no lower extremity edema ABDOMEN:abdomen soft, normal bowel sounds (+) Liver slightly enlarged about 2 cm below the ribcage, non-tender. Musculoskeletal:no cyanosis of digits and no clubbing  PSYCH: alert & oriented x 3 with fluent  speech NEURO: no focal motor/sensory deficits  LABORATORY DATA:  I have reviewed the data as listed CBC Latest Ref Rng & Units 06/28/2016 10/05/2014 03/27/2013  WBC 4.0 - 10.5 K/uL 8.3 10.6(H) 10.5  Hemoglobin 13.0 - 17.0 g/dL 14.3 15.8 15.6  Hematocrit 39.0 - 52.0 % 42.0 45.3 45.4  Platelets 150.0 - 400.0 K/uL 237.0 207.0 252.0   CMP Latest Ref Rng & Units 06/28/2016 10/05/2014 03/27/2013  Glucose 70 - 99 mg/dL 109(H) 97 173(H)  BUN 6 - 23 mg/dL 14 9 10   Creatinine 0.40 - 1.50 mg/dL 0.56 0.54 0.6  Sodium 135 - 145 mEq/L 137 138 135  Potassium 3.5 - 5.1 mEq/L 4.3 4.3 4.2  Chloride 96 - 112 mEq/L 104 102 102  CO2 19 - 32 mEq/L 29 27 26   Calcium 8.4 - 10.5 mg/dL 8.9 9.6 9.2  Total Protein 6.0 - 8.3 g/dL 7.3 7.6 7.7  Total Bilirubin 0.2 - 1.2 mg/dL 0.7 0.6 0.5  Alkaline Phos 39 - 117 U/L 174(H) 177(H) 115  AST 0 - 37 U/L 77(H) 94(H) 85(H)  ALT 0 - 53 U/L 41 61(H) 50    RADIOGRAPHIC STUDIES: I have personally reviewed the radiological images as listed and agreed with the findings in the report.  MRI Abdomen w wo Contrast 07/16/16 IMPRESSION: 1. Two enhancing lesions in the RIGHT hepatic lobe on the background of hepatitis-C and liver cirrhosis are consistent with hepatocellular carcinoma. Recommend multidisciplinary GI, surgical and oncological consultation. 2. Morphologic changes of cirrhosis. 3. No ascites.  Patent portal veins.  US Abdomen 07/12/16 IMPRESSION: 1. There is shadowing gallstone within gallbladder measures 1.6 cm. No sonographic Murphy's sign. No thickening of gallbladder wall. Normal CBD. 2. Again noted heterogeneous increased echogenicity of the liver with nodular contour consistent with cirrhosis. There is hypoechoic lesion in right hepatic lobe measures 1.7 x 2.4 cm. There is a second hypoechoic lesion in inferior aspect of the right hepatic lobe measures 4.6 x 3.6 cm. This is ill defined. Further evaluation with enhanced MRI is recommended to exclude evolving  hepatic masses. 3. No hydronephrosis or renal calculi. 4. No aortic aneurysm.  ASSESSMENT & PLAN: 61 y.o. Caucasian male with a personal history of alcohol abuse, smoking, cirrhosis of the liver, and chronic hepatitis-C.  1. Hepatocellular Carcinoma, multifocal (2) in right lobe, stage II -The patient's imaging findings was discussed in detail. - Giving his typial MRI image findings, which is consistent with hepatocellular carcinoma, and inderline liver cirrhosis, his diagnosis is quite certain. This was the consensus from our GI tumor board and tissue biopsy was not felt to be necessary.  -Due to his significant liver cirrhosis and multifocal disease, surgeon Dr. Barry Dienes did not feel he is a candidate for surgical candidate  -Due to his alcohol  abuse, he is currently not a candidate for liver transplant. However he has stopped drinking alcohol, he has been referred to liver clinic to see NP [name] to discuss liver transplant. The appointment has been scheduled.  -The patient saw Dr. Pascal Lux in March 2018 who discussed local therapy options; such as ablation or radio-embolization. Due to the uncertainty of the size of his liver lesions, Dr. Pascal Lux recommend a dedicated liver CTA to further evaluate. -he is scheduled to see Dr. Pascal Lux back to review his CT and finalize his liver targeted therapy -I discussed the role of systemic therapy to deliver cancer, including sorafenib, immunotherapy with Nivolumab, and chemotherapy. The systemic therapy will be offered if he has further disease progression after liver targeted therapy and further local therapy or transplant are not options.  -I will follow up with him every 6-12 months to ensure he is on the track for treatment and monitoring   2. Alchohol Cessation -The patient has gradually lessened the amount he drank over time. -He has completely stopped drinking alcohol in March 2018. -I encouraged the patient to continue abstaining from alcohol  consumption.  3. Smoking Cessation -He is down 1/2 ppd. Used be smoking 1 ppd. -We discussed smoking cessation and he is working on it.  4. Hepatitis-C, untreated -The patient has a history of chronic hepatitis-C. -he will follow up with [name], NP, and possible receive hep C treatment after his cancer treatment.  5. 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 Loletha Carrow and [place] and f/u in 6 months. -He will continue follow ups and treatment under Dr. Pascal Lux. -He will present to the liver clinic regarding his hep-C and transplant.   No orders of the defined types were placed in this encounter.   All questions were answered. The patient knows to call the clinic with any problems, questions or concerns. I spent 50 minutes counseling the patient face to face. The total time spent in the appointment was 55 minutes and more than 50% was on counseling.     Truitt Merle, MD 08/24/2016   This document serves as a record of services personally performed by Truitt Merle, MD. It was created on her behalf by Darcus Austin, a trained medical scribe. The creation of this record is based on the scribe's personal observations and the provider's statements to them. This document has been checked and approved by the attending provider.

## 2016-08-27 ENCOUNTER — Telehealth: Payer: Self-pay | Admitting: Hematology

## 2016-08-27 ENCOUNTER — Other Ambulatory Visit: Payer: Self-pay | Admitting: *Deleted

## 2016-08-27 DIAGNOSIS — C220 Liver cell carcinoma: Secondary | ICD-10-CM

## 2016-08-27 NOTE — Telephone Encounter (Signed)
No LOS per 08/24/16 visit.

## 2016-08-28 ENCOUNTER — Ambulatory Visit (HOSPITAL_COMMUNITY)
Admission: RE | Admit: 2016-08-28 | Discharge: 2016-08-28 | Disposition: A | Discharge status: Home / Self Care | Payer: BLUE CROSS/BLUE SHIELD | Source: Ambulatory Visit | Attending: Interventional Radiology | Admitting: Interventional Radiology

## 2016-08-28 ENCOUNTER — Encounter (HOSPITAL_COMMUNITY): Payer: Self-pay

## 2016-08-28 DIAGNOSIS — I2510 Atherosclerotic heart disease of native coronary artery without angina pectoris: Secondary | ICD-10-CM | POA: Diagnosis not present

## 2016-08-28 DIAGNOSIS — C220 Liver cell carcinoma: Secondary | ICD-10-CM | POA: Diagnosis present

## 2016-08-28 DIAGNOSIS — K7030 Alcoholic cirrhosis of liver without ascites: Secondary | ICD-10-CM | POA: Insufficient documentation

## 2016-08-28 DIAGNOSIS — I700 Atherosclerosis of aorta: Secondary | ICD-10-CM | POA: Diagnosis not present

## 2016-08-28 DIAGNOSIS — K5730 Diverticulosis of large intestine without perforation or abscess without bleeding: Secondary | ICD-10-CM | POA: Insufficient documentation

## 2016-08-28 LAB — POCT I-STAT CREATININE: Creatinine, Ser: 0.6 mg/dL — ABNORMAL LOW (ref 0.61–1.24)

## 2016-08-28 MED ORDER — IOPAMIDOL (ISOVUE-300) INJECTION 61%
INTRAVENOUS | Status: AC
  Administered 2016-08-28: 100 mL
  Filled 2016-08-28: qty 100

## 2016-08-30 ENCOUNTER — Ambulatory Visit
Admission: RE | Admit: 2016-08-30 | Discharge: 2016-08-30 | Disposition: A | Discharge status: Home / Self Care | Payer: BLUE CROSS/BLUE SHIELD | Source: Ambulatory Visit | Attending: Interventional Radiology | Admitting: Interventional Radiology

## 2016-08-30 DIAGNOSIS — K7030 Alcoholic cirrhosis of liver without ascites: Secondary | ICD-10-CM

## 2016-08-30 DIAGNOSIS — C220 Liver cell carcinoma: Secondary | ICD-10-CM

## 2016-08-30 HISTORY — PX: IR RADIOLOGIST EVAL & MGMT: IMG5224

## 2016-08-30 NOTE — Progress Notes (Signed)
Patient ID: Joshua [name], male   DOB: [birth year], 61 y.o.   MRN: [id number]         Chief Complaint: Multifocal hepatocellular carcinoma  Referring Physician(s): Danis (GI) Drazek (GI) Burr Medico (Oncology) Barry Dienes (Hepatobillary Surgery)  History of Present Illness: Joshua [name] is a 61 y.o. male with past medical history significant for hypertension, smoking, alcoholic cirrhosis and hepatitis C who was found to have an indeterminate liver lesions on abdominal ultrasound performed 07/12/2016 obtained for the work-up of an elevated AFP level of 5.3 (obtained on 06/28/2016).  Subsequent abdominal MRI performed 07/16/2016 and demonstrates these lesions worrisome for multifocal data sellar carcinoma. Patient initially seen in consultation for potential percutaneous management of hepatocellular carcinoma on 08/21/2016. He returns today to the interventional radiology clinic today, accompanied by his mother and father-in-law, to discuss the results following the acquisition of a planning CTA of the abdomen and pelvis obtained on 08/28/2016.  The patient remains asymptomatic in regards to his hepatocellular carcinoma. Specifically, no unintentional weight loss or weight gain. No change in baseline energy level. Patient remains independent with all activities of daily living. No yellowing of the skin or eyes. No increased abdominal girth. No confusion.   Past Medical History:  Diagnosis Date  . Chronic alcoholism ([place])   . Cirrhosis ([place])   . Depression   . Glucose intolerance (impaired glucose tolerance)   . Hepatitis C   . Hypertension   . Tobacco use   . Umbilical hernia     Past Surgical History:  Procedure Laterality Date  . CATARACT EXTRACTION    . COLONOSCOPY  12/09  . INGUINAL HERNIA REPAIR    . left orchiectomy  07/2009  . UMBILICAL HERNIA REPAIR      Allergies: Patient has no known allergies.  Medications: Prior to Admission medications   Medication Sig Start Date End Date Taking?  Authorizing Provider  b complex vitamins capsule Take 1 capsule by mouth daily.    Historical Provider, MD  lisinopril-hydrochlorothiazide (PRINZIDE,ZESTORETIC) 10-12.5 MG tablet TAKE 1 TABLET BY MOUTH EVERY DAY 04/18/16   Marletta Lor, MD  Multiple Vitamin (MULTIVITAMIN) tablet Take 1 tablet by mouth daily.    Historical Provider, MD  sertraline (ZOLOFT) 100 MG tablet TAKE 1 TABLET BY MOUTH EVERY DAY 05/29/16   Marletta Lor, MD  sildenafil (VIAGRA) 100 MG tablet Take 1 tablet (100 mg total) by mouth daily as needed for erectile dysfunction. 07/24/12   Marletta Lor, MD  traZODone (DESYREL) 50 MG tablet TAKE 2 TABLETS BY MOUTH AT BEDTIME 08/27/15   Marletta Lor, MD     Family History  Problem Relation Age of Onset  . Healthy Mother   . Colon cancer Neg Hx   . Stomach cancer Neg Hx     Social History   Social History  . Marital status: Single    Spouse name: N/A  . Number of children: N/A  . Years of education: N/A   Social History Main Topics  . Smoking status: Current Every Day Smoker    Packs/day: 1.00    Years: 42.00    Types: Cigarettes    Start date: 05/21/1971  . Smokeless tobacco: Never Used     Comment: Has cut down to about 1/3 pack per day  . Alcohol use No     Comment: he used to drink beer 6 pack a day since age of 78, stopped in 07/2016  . Drug use: Yes  . Sexual activity: Not on  file   Other Topics Concern  . Not on file   Social History Narrative  . No narrative on file    ECOG Status: 0 - Asymptomatic  Review of Systems: A 12 point ROS discussed and pertinent positives are indicated in the HPI above.  All other systems are negative.  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 He presents for arterial mapping and lesions size evaluation prior to definitive liver directed therapy. EXAM: CTA ABDOMEN AND PELVIS WITHOUT AND WITH CONTRAST TECHNIQUE: Multidetector CT imaging of the abdomen and pelvis was performed using the standard protocol during bolus administration of intravenous contrast. Multiplanar reconstructed images and MIPs were obtained and reviewed to evaluate the vascular anatomy. CONTRAST:  130mL ISOVUE-300 IOPAMIDOL (ISOVUE-300) INJECTION 61% COMPARISON:  MRI abdomen 07/16/2016; abdominal ultrasound 07/12/2016 FINDINGS: VASCULAR Aorta: Heterogeneous atherosclerotic plaque which is primarily calcified throughout the abdominal aorta. No evidence of aneurysm or dissection. Celiac: Mild calcified atherosclerotic plaque at the origin without significant stenosis. Conventional hepatic arterial anatomy. No evidence of accessory or replaced right hepatic artery. The right gastric artery is not definitively identified but may arise from the gastroduodenal artery. SMA: Complex calcified and fibrofatty atherosclerotic plaque beginning approximately 1 cm beyond the vessel origin results in a moderate stenosis measuring approximately 2 cm in length. Renals: Single dominant renal arteries bilaterally. Heavy calcified atherosclerotic plaque at the origin of the right renal artery results in a moderate to high-grade focal stenosis. On the left, bulky calcified atherosclerotic plaque results in moderate stenosis. IMA: Patent. Inflow: Heavily calcified atherosclerotic plaque in the bilateral common iliac arteries without significant focal stenosis. There is likely a mild focal stenosis on the left. Both internal iliac arteries are patent. The external iliac arteries are tortuous but relatively spared from disease.  Proximal Outflow: Moderate bulky calcified atherosclerotic plaque along the posterior wall of the right common femoral artery results in mild stenosis. On the left, there is markedly bulky calcified atherosclerotic plaque resulting in a significant stenosis of the common femoral artery. The visualized profunda and superficial femoral arteries are widely patent on the right. However, on the left the bulky calcified plaque in the common femoral artery extends into both the superficial and profunda femoral arteries resulting in a critical stenosis of the origin of the superficial femoral artery and a moderate stenosis of the origin of the profunda femoral artery. Veins: No focal venous abnormality. Specifically, the main and intrahepatic portal veins are widely patent. Review of the MIP images confirms the above findings. NON-VASCULAR Lower chest: Respiratory motion limits evaluation small pulmonary nodules. However, within these limitations, no suspicious pulmonary nodule or mass is identified. The visualized cardiac structures are within normal limits for size. No pericardial effusion. Unremarkable visualized distal thoracic esophagus. Hepatobiliary: Morphologically cirrhotic liver with marked hypertrophy of the caudate lobe and a diffusely nodular surface contour. There is an avidly enhancing round 2.1 x 2.1 x 2.4 cm lesion superiorly within hepatic segment 8 which demonstrates delayed washout consistent with the  previously noted hepatocellular carcinoma. A second avidly enhancing lesion with a more irregular and amorphous shape is present within hepatic segment 6. By my measurements, this lesion measures approximately 3.2 x 2.2 x 3.4 cm. Delayed washout is more subtle and difficult to confirm by CT imaging. A third a small (0.8 cm) avidly enhancing lesion is noted in the posterior tip of hepatic segment 6 (axial image 52, series 6; sagittal image 26 series 10; coronal image 54 series 9) is also concerning. There is  no intrahepatic biliary ductal dilatation. Multiple stones layer within the gallbladder neck. Pancreas: Unremarkable. No pancreatic ductal dilatation or surrounding inflammatory changes. Spleen: Normal in size without focal abnormality. Adrenals/Urinary Tract: Normal adrenal glands. The kidneys are normal in size without evidence of hydronephrosis, nephrolithiasis or enhancing renal mass. No significant renal cortical atrophy despite suspicion for high-grade stenosis of the right renal artery. Normal bladder. Stomach/Bowel: Colonic diverticular disease without CT evidence of active inflammation. No focal bowel wall thickening or evidence of obstruction. Lymphatic: No suspicious lymphadenopathy. Reproductive: Dystrophic calcifications centrally within the prostate gland. The gland remains normal in size. No enhancing mass. Other: No abdominal wall hernia or abnormality. No abdominopelvic ascites. Musculoskeletal: No acute fracture or aggressive appearing lytic or blastic osseous lesion. IMPRESSION: VASCULAR 1. Conventional hepatic arterial anatomy. 2. Extensive atherosclerotic vascular disease including a calcified aortic atherosclerosis. Aortic Atherosclerosis (ICD10-I70.0) 3. 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 4. Moderate to high-grade focal stenosis at the origin of the right renal artery. 5. Mild to moderate focal stenosis at the origin of the left renal artery. 6. Mild focal stenosis in the left common iliac artery. NON-VASCULAR 1. Three avidly enhancing lesions within the liver as described above. The 2 larger lesions demonstrate delayed washout consistent with hepatocellular carcinoma as noted on the prior MRI. Of note, the segment 6 lesion is irregular in shape and measures at least 3.4 cm in maximal dimension. The third and smallest lesion measures less than 1 cm and is too small for accurate characterization but  warrants close attention on follow-up imaging as this could represent an additional focus of Joshua [name]. 2. Hepatic cirrhosis without evidence of portal vein thrombosis or splenomegaly. 3. Colonic diverticular disease without CT evidence of active inflammation. 4. Additional ancillary findings as above. Signed, Criselda Peaches, MD [facility] Radiology Electronically Signed   By: Jacqulynn Cadet M.D.   On: 08/28/2016 11:41    Labs:  CBC:  Recent Labs  06/28/16 1012  WBC 8.3  HGB 14.3  HCT 42.0  PLT 237.0    COAGS:  Recent Labs  08/03/16 1221  INR 1.0    BMP:  Recent Labs  06/28/16 1012 08/28/16 0846  NA 137  --   K 4.3  --   CL 104  --   CO2 29  --   GLUCOSE 109*  --   BUN 14  --   CALCIUM 8.9  --   CREATININE 0.56 0.60*    LIVER FUNCTION TESTS:  Recent Labs  06/28/16 1012  BILITOT 0.7  AST 77*  ALT 41  ALKPHOS 174*  PROT 7.3  ALBUMIN 3.8    TUMOR MARKERS:  Recent Labs  06/28/16 1012  AFPTM 5.3    Assessment and Plan:  Joshua [name] is a 61 y.o. male with past medical history significant for hypertension, smoking, alcoholic cirrhosis and hepatitis C who was found to have an indeterminate liver  lesions on abdominal ultrasound performed 07/12/2016 obtained for the work-up of an elevated AFP level of 5.3 (obtained on 06/28/2016).  Subsequent abdominal MRI performed 07/16/2016 and demonstrates these lesions worrisome for multifocal data sellar carcinoma. Patient initially seen in consultation for potential percutaneous management of hepatocellular carcinoma on 08/21/2016. He returns today to the interventional radiology clinic today, accompanied by his mother and father-in-law, to discuss the results following the acquisition of a planning CTA of the abdomen and pelvis obtained on 08/28/2016.  The patient remains asymptomatic. (Note, the patient's clinical case was discussed with [name], who wishes for the  patient to undergo definitive hepatocellular carcinoma treatment prior to beginning hepatitis C treatment.)  I again explained to the patient that the gold standard of hepatocellular carcinoma is surgical resection and we will seek input from Dr. Barry Dienes regarding his operative candidacy.  Assuming he is NOT an operative candidate, percutaneous treatment options were again discussed at length with the patient and the patient's family.  I explained that the dominant lesion within the caudal aspect of the right lobe of the liver is irregular in appearance and likely measures greater than 3 cm in diameter. As such, I feel this patient would benefit from undergoing bland embolization of the 2 dominant liver lesions with Lumi Beads approximately 1 month prior to proceeding with microwave ablation.  I explained the embolization would be performed in hopes of decreasing the size of both lesions as well as label/mark both lesions with the radiopaque embolization spheres to allow for improved visualization at the time of microwave ablation.  I explained the microwave ablation will be performed with curative intent.   I explained that he has another punctate (approximately 6 mm) lesion within the caudal aspect of the right lobe of the liver which we will continue to observe at this time.  Following this prolonged and detailed conversation, the patient and the patient's family wishes to proceed with this proposed plan of care.  As such, pending ultimate surgical decision by Dr. Barry Dienes, the bland embolization will be performed with conscious sedation at [facility]. The procedure will entail an overnight admission for continued observation and PCA usage.  The patient will be seen in repeat consultation in approximately 2-3 weeks after the initial embolization to ensure complete recovery from his initial procedure and ensure normalization of his LFTS prior to proceeding with the microwave ablation.    Eventually the [facility], however will require general anesthesia.  The ablation will also require an overnight admission for PCA usage and observation.  The patient was encouraged to call the interventional radiology clinic with any interval questions or concerns.  A copy of this report was sent to the requesting provider on this date.  Electronically Signed: Sandi Mariscal 08/30/2016, 2:43 PM   I spent a total of 25 Minutes in face to face in clinical consultation, greater than 50% of which was counseling/coordinating care for treatment options for multifocal hepatocellular carcinoma

## 2016-09-04 ENCOUNTER — Other Ambulatory Visit (HOSPITAL_COMMUNITY): Payer: Self-pay | Admitting: Interventional Radiology

## 2016-09-04 DIAGNOSIS — K7030 Alcoholic cirrhosis of liver without ascites: Secondary | ICD-10-CM

## 2016-09-04 DIAGNOSIS — C220 Liver cell carcinoma: Secondary | ICD-10-CM

## 2016-09-05 ENCOUNTER — Other Ambulatory Visit (HOSPITAL_COMMUNITY): Payer: Self-pay | Admitting: Interventional Radiology

## 2016-09-05 DIAGNOSIS — C220 Liver cell carcinoma: Secondary | ICD-10-CM

## 2016-09-05 DIAGNOSIS — K7030 Alcoholic cirrhosis of liver without ascites: Secondary | ICD-10-CM

## 2016-09-12 ENCOUNTER — Other Ambulatory Visit: Payer: Self-pay | Admitting: Radiology

## 2016-09-12 ENCOUNTER — Encounter: Payer: BLUE CROSS/BLUE SHIELD | Admitting: Internal Medicine

## 2016-09-12 ENCOUNTER — Other Ambulatory Visit: Payer: Self-pay | Admitting: General Surgery

## 2016-09-13 ENCOUNTER — Ambulatory Visit (HOSPITAL_COMMUNITY)
Admission: RE | Admit: 2016-09-13 | Discharge: 2016-09-13 | Disposition: A | Discharge status: Home / Self Care | Payer: BLUE CROSS/BLUE SHIELD | Source: Ambulatory Visit | Attending: Interventional Radiology | Admitting: Interventional Radiology

## 2016-09-13 ENCOUNTER — Encounter (HOSPITAL_COMMUNITY): Payer: Self-pay

## 2016-09-13 ENCOUNTER — Observation Stay (HOSPITAL_COMMUNITY)
Admission: RE | Admit: 2016-09-13 | Discharge: 2016-09-14 | Disposition: A | Discharge status: Home / Self Care | Payer: BLUE CROSS/BLUE SHIELD | Source: Ambulatory Visit | Attending: Interventional Radiology | Admitting: Interventional Radiology

## 2016-09-13 DIAGNOSIS — K7030 Alcoholic cirrhosis of liver without ascites: Secondary | ICD-10-CM | POA: Insufficient documentation

## 2016-09-13 DIAGNOSIS — I70202 Unspecified atherosclerosis of native arteries of extremities, left leg: Secondary | ICD-10-CM | POA: Diagnosis not present

## 2016-09-13 DIAGNOSIS — Z720 Tobacco use: Secondary | ICD-10-CM | POA: Diagnosis not present

## 2016-09-13 DIAGNOSIS — C220 Liver cell carcinoma: Secondary | ICD-10-CM | POA: Diagnosis not present

## 2016-09-13 DIAGNOSIS — I701 Atherosclerosis of renal artery: Secondary | ICD-10-CM | POA: Diagnosis not present

## 2016-09-13 DIAGNOSIS — F329 Major depressive disorder, single episode, unspecified: Secondary | ICD-10-CM | POA: Insufficient documentation

## 2016-09-13 DIAGNOSIS — I10 Essential (primary) hypertension: Secondary | ICD-10-CM | POA: Diagnosis not present

## 2016-09-13 DIAGNOSIS — B1920 Unspecified viral hepatitis C without hepatic coma: Secondary | ICD-10-CM | POA: Diagnosis not present

## 2016-09-13 DIAGNOSIS — R7302 Impaired glucose tolerance (oral): Secondary | ICD-10-CM | POA: Diagnosis not present

## 2016-09-13 DIAGNOSIS — I700 Atherosclerosis of aorta: Secondary | ICD-10-CM | POA: Insufficient documentation

## 2016-09-13 HISTORY — PX: IR US GUIDE VASC ACCESS RIGHT: IMG2390

## 2016-09-13 HISTORY — PX: IR EMBO TUMOR ORGAN ISCHEMIA INFARCT INC GUIDE ROADMAPPING: IMG5449

## 2016-09-13 HISTORY — PX: IR ANGIOGRAM SELECTIVE EACH ADDITIONAL VESSEL: IMG667

## 2016-09-13 HISTORY — PX: IR 3D INDEPENDENT WKST: IMG2385

## 2016-09-13 HISTORY — PX: IR ANGIOGRAM VISCERAL SELECTIVE: IMG657

## 2016-09-13 LAB — CBC WITH DIFFERENTIAL/PLATELET
BASOS ABS: 0 10*3/uL (ref 0.0–0.1)
Basophils Relative: 0 %
EOS PCT: 1 %
Eosinophils Absolute: 0.1 10*3/uL (ref 0.0–0.7)
HEMATOCRIT: 40.8 % (ref 39.0–52.0)
Hemoglobin: 14.3 g/dL (ref 13.0–17.0)
LYMPHS ABS: 2.6 10*3/uL (ref 0.7–4.0)
Lymphocytes Relative: 24 %
MCH: 34.6 pg — ABNORMAL HIGH (ref 26.0–34.0)
MCHC: 35 g/dL (ref 30.0–36.0)
MCV: 98.8 fL (ref 78.0–100.0)
MONO ABS: 0.3 10*3/uL (ref 0.1–1.0)
MONOS PCT: 3 %
NEUTROS ABS: 7.7 10*3/uL (ref 1.7–7.7)
Neutrophils Relative %: 72 %
PLATELETS: 254 10*3/uL (ref 150–400)
RBC: 4.13 MIL/uL — AB (ref 4.22–5.81)
RDW: 13.7 % (ref 11.5–15.5)
WBC: 10.7 10*3/uL — AB (ref 4.0–10.5)

## 2016-09-13 LAB — COMPREHENSIVE METABOLIC PANEL
ALK PHOS: 140 U/L — AB (ref 38–126)
ALT: 39 U/L (ref 17–63)
ANION GAP: 10 (ref 5–15)
AST: 75 U/L — ABNORMAL HIGH (ref 15–41)
Albumin: 3.9 g/dL (ref 3.5–5.0)
BILIRUBIN TOTAL: 1 mg/dL (ref 0.3–1.2)
BUN: 9 mg/dL (ref 6–20)
CALCIUM: 9.1 mg/dL (ref 8.9–10.3)
CO2: 25 mmol/L (ref 22–32)
CREATININE: 0.62 mg/dL (ref 0.61–1.24)
Chloride: 105 mmol/L (ref 101–111)
GFR calc non Af Amer: >60 mL/min (ref >60)
Glucose, Bld: 136 mg/dL — ABNORMAL HIGH (ref 65–99)
Potassium: 3.5 mmol/L (ref 3.5–5.1)
Sodium: 140 mmol/L (ref 135–145)
TOTAL PROTEIN: 8 g/dL (ref 6.5–8.1)

## 2016-09-13 LAB — PROTIME-INR
INR: 1.07
Prothrombin Time: 14 seconds (ref 11.4–15.2)

## 2016-09-13 LAB — APTT: aPTT: 29 seconds (ref 24–36)

## 2016-09-13 MED ORDER — IOPAMIDOL (ISOVUE-300) INJECTION 61%
75.0000 mL | Freq: Once | INTRAVENOUS | Status: AC | PRN
  Administered 2016-09-13: 130 mL via INTRA_ARTERIAL

## 2016-09-13 MED ORDER — IOPAMIDOL (ISOVUE-300) INJECTION 61%
INTRAVENOUS | Status: AC
  Administered 2016-09-13: 35 mL via INTRA_ARTERIAL
  Filled 2016-09-13: qty 400

## 2016-09-13 MED ORDER — MIDAZOLAM HCL 2 MG/2ML IJ SOLN
INTRAMUSCULAR | Status: AC
  Filled 2016-09-13: qty 8

## 2016-09-13 MED ORDER — FENTANYL CITRATE (PF) 100 MCG/2ML IJ SOLN
INTRAMUSCULAR | Status: AC
  Filled 2016-09-13: qty 8

## 2016-09-13 MED ORDER — HYDROCHLOROTHIAZIDE 12.5 MG PO CAPS
12.5000 mg | ORAL_CAPSULE | Freq: Every day | ORAL | Status: DC
  Administered 2016-09-14: 12.5 mg via ORAL
  Filled 2016-09-13: qty 1

## 2016-09-13 MED ORDER — PROMETHAZINE HCL 25 MG RE SUPP: 25.0000 mg | Freq: Three times a day (TID) | RECTAL | Status: DC | PRN

## 2016-09-13 MED ORDER — FENTANYL CITRATE (PF) 100 MCG/2ML IJ SOLN
INTRAMUSCULAR | Status: AC | PRN
  Administered 2016-09-13: 50 ug via INTRAVENOUS
  Administered 2016-09-13: 25 ug via INTRAVENOUS
  Administered 2016-09-13: 50 ug via INTRAVENOUS
  Administered 2016-09-13: 25 ug via INTRAVENOUS
  Administered 2016-09-13: 50 ug via INTRAVENOUS
  Administered 2016-09-13 (×3): 25 ug via INTRAVENOUS

## 2016-09-13 MED ORDER — TRAZODONE HCL 100 MG PO TABS
100.0000 mg | ORAL_TABLET | Freq: Every day | ORAL | Status: DC
  Administered 2016-09-13: 100 mg via ORAL
  Filled 2016-09-13: qty 1

## 2016-09-13 MED ORDER — IOPAMIDOL (ISOVUE-300) INJECTION 61%
50.0000 mL | Freq: Once | INTRAVENOUS | Status: AC | PRN
  Administered 2016-09-13: 35 mL via INTRA_ARTERIAL

## 2016-09-13 MED ORDER — SODIUM CHLORIDE 0.9 % IV SOLN: 250.0000 mL | INTRAVENOUS | Status: DC | PRN

## 2016-09-13 MED ORDER — SERTRALINE HCL 100 MG PO TABS
100.0000 mg | ORAL_TABLET | Freq: Every day | ORAL | Status: DC
  Administered 2016-09-13 – 2016-09-14 (×2): 100 mg via ORAL
  Filled 2016-09-13: qty 1

## 2016-09-13 MED ORDER — HYDROMORPHONE 1 MG/ML IV SOLN
INTRAVENOUS | Status: DC
  Administered 2016-09-13: 16:00:00 via INTRAVENOUS
  Administered 2016-09-13: 2.4 mg via INTRAVENOUS
  Administered 2016-09-14: 0.9 mg via INTRAVENOUS
  Administered 2016-09-14: 1.2 mg via INTRAVENOUS

## 2016-09-13 MED ORDER — LIDOCAINE-EPINEPHRINE (PF) 2 %-1:200000 IJ SOLN
INTRAMUSCULAR | Status: AC | PRN
  Administered 2016-09-13: 10 mL

## 2016-09-13 MED ORDER — SODIUM CHLORIDE 0.9% FLUSH: 3.0000 mL | INTRAVENOUS | Status: DC | PRN

## 2016-09-13 MED ORDER — LIDOCAINE HCL 1 % IJ SOLN: INTRAMUSCULAR | Status: DC | PRN

## 2016-09-13 MED ORDER — ONDANSETRON HCL 4 MG/2ML IJ SOLN
4.0000 mg | Freq: Four times a day (QID) | INTRAMUSCULAR | Status: DC | PRN
  Administered 2016-09-13: 4 mg via INTRAVENOUS
  Filled 2016-09-13: qty 2

## 2016-09-13 MED ORDER — DOCUSATE SODIUM 100 MG PO CAPS
100.0000 mg | ORAL_CAPSULE | Freq: Two times a day (BID) | ORAL | Status: DC
  Administered 2016-09-13 – 2016-09-14 (×2): 100 mg via ORAL
  Filled 2016-09-13 (×2): qty 1

## 2016-09-13 MED ORDER — CEFAZOLIN SODIUM-DEXTROSE 2-4 GM/100ML-% IV SOLN
INTRAVENOUS | Status: AC
  Administered 2016-09-13: 2 g via INTRAVENOUS
  Filled 2016-09-13: qty 100

## 2016-09-13 MED ORDER — ADULT MULTIVITAMIN W/MINERALS CH
1.0000 | ORAL_TABLET | Freq: Every day | ORAL | Status: DC
  Administered 2016-09-13 – 2016-09-14 (×2): 1 via ORAL
  Filled 2016-09-13: qty 1

## 2016-09-13 MED ORDER — CEFAZOLIN SODIUM-DEXTROSE 2-4 GM/100ML-% IV SOLN
2.0000 g | INTRAVENOUS | Status: AC
  Administered 2016-09-13: 2 g via INTRAVENOUS

## 2016-09-13 MED ORDER — SODIUM CHLORIDE 0.9% FLUSH
9.0000 mL | INTRAVENOUS | Status: DC | PRN
Start: 2016-09-13 — End: 2016-09-14

## 2016-09-13 MED ORDER — MIDAZOLAM HCL 2 MG/2ML IJ SOLN
INTRAMUSCULAR | Status: AC | PRN
  Administered 2016-09-13 (×6): 1 mg via INTRAVENOUS

## 2016-09-13 MED ORDER — B COMPLEX-C PO TABS
1.0000 | ORAL_TABLET | Freq: Every day | ORAL | Status: DC
  Administered 2016-09-13 – 2016-09-14 (×2): 1 via ORAL
  Filled 2016-09-13 (×2): qty 1

## 2016-09-13 MED ORDER — DIPHENHYDRAMINE HCL 12.5 MG/5ML PO ELIX
12.5000 mg | ORAL_SOLUTION | Freq: Four times a day (QID) | ORAL | Status: DC | PRN
  Filled 2016-09-13: qty 5

## 2016-09-13 MED ORDER — DIPHENHYDRAMINE HCL 50 MG/ML IJ SOLN: 12.5000 mg | Freq: Four times a day (QID) | INTRAMUSCULAR | Status: DC | PRN

## 2016-09-13 MED ORDER — HYDROMORPHONE 1 MG/ML IV SOLN
INTRAVENOUS | Status: AC
  Filled 2016-09-13: qty 25

## 2016-09-13 MED ORDER — LIDOCAINE-EPINEPHRINE (PF) 2 %-1:200000 IJ SOLN
INTRAMUSCULAR | Status: AC
  Filled 2016-09-13: qty 20

## 2016-09-13 MED ORDER — PROMETHAZINE HCL 25 MG PO TABS: 25.0000 mg | ORAL_TABLET | Freq: Three times a day (TID) | ORAL | Status: DC | PRN

## 2016-09-13 MED ORDER — LISINOPRIL 10 MG PO TABS
10.0000 mg | ORAL_TABLET | Freq: Every day | ORAL | Status: DC
  Administered 2016-09-14: 10 mg via ORAL
  Filled 2016-09-13: qty 1

## 2016-09-13 MED ORDER — LISINOPRIL-HYDROCHLOROTHIAZIDE 10-12.5 MG PO TABS: 1.0000 | ORAL_TABLET | Freq: Every day | ORAL | Status: DC

## 2016-09-13 MED ORDER — SODIUM CHLORIDE 0.9 % IV SOLN
INTRAVENOUS | Status: DC
  Administered 2016-09-13: 10:00:00 via INTRAVENOUS

## 2016-09-13 MED ORDER — NALOXONE HCL 0.4 MG/ML IJ SOLN: 0.4000 mg | INTRAMUSCULAR | Status: DC | PRN

## 2016-09-13 MED ORDER — SODIUM CHLORIDE 0.9% FLUSH
3.0000 mL | Freq: Two times a day (BID) | INTRAVENOUS | Status: DC
  Administered 2016-09-14: 3 mL via INTRAVENOUS

## 2016-09-13 NOTE — H&P (Signed)
Referring Physician(s): Feng,Y  Supervising Physician: Sandi Mariscal  Patient Status:  [facility] OP TBA  Chief Complaint: Multifocal hepatocellular carcinoma   Subjective: Patient familiar to IR service from prior consultation on 08/21/16 to discuss treatment options for multifocal hepatocellular carcinoma. Past medical history significant for hypertension, smoking, alcoholic cirrhosis and hepatitis C. He was seen in follow-up by Dr. Pascal Lux on 08/30/16 and was deemed an appropriate candidate for bland hepatic embolization of his [facility] prior to microwave ablation. Recent CT angiogram of the abdomen and pelvis on 08/28/16 revealed:  Conventional hepatic arterial anatomy. 2. Extensive atherosclerotic vascular disease including a calcified aortic atherosclerosis. Aortic Atherosclerosis (ICD10-I70.0) 3. 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 4. Moderate to high-grade focal stenosis at the origin of the right renal artery. 5. Mild to moderate focal stenosis at the origin of the left renal artery. 6. Mild focal stenosis in the left common iliac artery.  NON-VASCULAR  1. Three avidly enhancing lesions within the liver as described above. The 2 larger lesions demonstrate delayed washout consistent with hepatocellular carcinoma as noted on the prior MRI. Of note, the segment 6 lesion is irregular in shape and measures at least 3.4 cm in maximal dimension. The third and smallest lesion measures less than 1 cm and is too small for accurate characterization but warrants close attention on follow-up imaging as this could represent an additional focus of [place]. 2. Hepatic cirrhosis without evidence of portal vein thrombosis or splenomegaly. 3. Colonic diverticular disease without CT evidence of active inflammation.  He presents today for initial bland hepatic embolization of the hepatocellular carcinomas. He  currently denies fever, chest pain, dyspnea, cough, abdominal pain, back pain, nausea, vomiting or abnormal bleeding. He does have occasional headaches. Past Medical History:  Diagnosis Date  . Chronic alcoholism ([place])   . Cirrhosis ([place])   . Depression   . Glucose intolerance (impaired glucose tolerance)   . Hepatitis C   . Hypertension   . Tobacco use   . Umbilical hernia    Past Surgical History:  Procedure Laterality Date  . CATARACT EXTRACTION    . COLONOSCOPY  12/09  . INGUINAL HERNIA REPAIR    . left orchiectomy  07/2009  . UMBILICAL HERNIA REPAIR      Allergies: Patient has no known allergies.  Medications: Prior to Admission medications   Medication Sig Start Date End Date Taking? Authorizing Provider  b complex vitamins capsule Take 1 capsule by mouth daily.   Yes Historical Provider, MD  lisinopril-hydrochlorothiazide (PRINZIDE,ZESTORETIC) 10-12.5 MG tablet TAKE 1 TABLET BY MOUTH EVERY DAY 04/18/16  Yes Marletta Lor, MD  Multiple Vitamin (MULTIVITAMIN) tablet Take 1 tablet by mouth daily.   Yes Historical Provider, MD  sertraline (ZOLOFT) 100 MG tablet TAKE 1 TABLET BY MOUTH EVERY DAY 05/29/16  Yes Marletta Lor, MD  traZODone (DESYREL) 50 MG tablet TAKE 2 TABLETS BY MOUTH AT BEDTIME 08/27/15  Yes Marletta Lor, MD  sildenafil (VIAGRA) 100 MG tablet Take 1 tablet (100 mg total) by mouth daily as needed for erectile dysfunction. 07/24/12   Marletta Lor, MD     Vital Signs: BP (!) 166/93 (BP Location: Right Arm)   Pulse 89   Temp 98 F (36.7 C) (Oral)   Resp 18   Wt 150 lb (68 kg)   SpO2 98%   BMI 20.92 kg/m   Physical Exam awake, alert. Chest clear to auscultation bilaterally. Heart with  regular rate and rhythm, positive murmur; abdomen soft, positive bowel sounds, nontender. Lower extremities with no significant edema. Noted bruising on forearms  Imaging: No results found.  Labs:  CBC:  Recent Labs  06/28/16 1012 09/13/16 0942   WBC 8.3 10.7*  HGB 14.3 14.3  HCT 42.0 40.8  PLT 237.0 254    COAGS:  Recent Labs  08/03/16 1221 09/13/16 0942  INR 1.0 1.07  APTT  --  29    BMP:  Recent Labs  06/28/16 1012 08/28/16 0846 09/13/16 0942  NA 137  --  140  K 4.3  --  3.5  CL 104  --  105  CO2 29  --  25  GLUCOSE 109*  --  136*  BUN 14  --  9  CALCIUM 8.9  --  9.1  CREATININE 0.56 0.60* 0.62  GFRNONAA  --   --  >60  GFRAA  --   --  >60    LIVER FUNCTION TESTS:  Recent Labs  06/28/16 1012 09/13/16 0942  BILITOT 0.7 1.0  AST 77* 75*  ALT 41 39  ALKPHOS 174* 140*  PROT 7.3 8.0  ALBUMIN 3.8 3.9    Assessment and Plan: Patient with history of alcoholic cirrhosis, HTN, hepatitis C, tobacco abuse, and multifocal hepatocellular carcinoma with increasing but normal  AFP. Past imaging has revealed 3 enhancing lesions within the liver in segments 6 and 8, 2 of which demonstrate delayed washout consistent with hepatocellular carcinoma. Following IR consultation he was deemed an appropriate candidate for bland embolization of the [facility] prior to microwave ablation. He presents today for the bland hepatic embolization. Details/risks of procedure, including but not limited to, internal bleeding, infection, nontarget embolization, injury to adjacent structures, discussed with patient and mother with their understanding and consent. Post procedure the patient will be admitted for overnight observation to monitor for post embolization syndrome.    Electronically Signed: D. Rowe Frank 09/13/2016, 11:18 AM   I spent a total of 30 minutes at the the patient's bedside AND on the patient's hospital floor or unit, greater than 50% of which was counseling/coordinating care for hepatic/visceral arteriogram with bland embolization of hepatic tumors

## 2016-09-13 NOTE — Sedation Documentation (Signed)
Pt states he is having back pain that he needs to void.

## 2016-09-13 NOTE — Procedures (Signed)
Pre-procedure Diagnosis: Multifocal HCC Post-procedure Diagnosis: Same  Post bland embolization of 2 lesions within the right lobe of the liver.    Complications: None Immediate  EBL: None  Keep right leg straight for 4 hrs.    SignedSandi Mariscal Pager: [phone] 09/13/2016, 3:49 PM

## 2016-09-13 NOTE — Discharge Instructions (Signed)
Moderate Conscious Sedation, Adult, Care After These instructions provide you with information about caring for yourself after your procedure. Your health care provider may also give you more specific instructions. Your treatment has been planned according to current medical practices, but problems sometimes occur. Call your health care provider if you have any problems or questions after your procedure. What can I expect after the procedure? After your procedure, it is common:  To feel sleepy for several hours.  To feel clumsy and have poor balance for several hours.  To have poor judgment for several hours.  To vomit if you eat too soon. Follow these instructions at home: For at least 24 hours after the procedure:    Do not:  Participate in activities where you could fall or become injured.  Drive.  Use heavy machinery.  Drink alcohol.  Take sleeping pills or medicines that cause drowsiness.  Make important decisions or sign legal documents.  Take care of children on your own.  Rest. Eating and drinking   Follow the diet recommended by your health care provider.  If you vomit:  Drink water, juice, or soup when you can drink without vomiting.  Make sure you have little or no nausea before eating solid foods. General instructions   Have a responsible adult stay with you until you are awake and alert.  Take over-the-counter and prescription medicines only as told by your health care provider.  If you smoke, do not smoke without supervision.  Keep all follow-up visits as told by your health care provider. This is important. Contact a health care provider if:  You keep feeling nauseous or you keep vomiting.  You feel light-headed.  You develop a rash.  You have a fever. Get help right away if:  You have trouble breathing. This information is not intended to replace advice given to you by your health care provider. Make sure you discuss any questions you  have with your health care provider. Document Released: 02/18/2013 Document Revised: 10/03/2015 Document Reviewed: 08/20/2015 Elsevier Interactive Patient Education  2017 [place] After This sheet gives you information about how to care for yourself after your procedure. Your doctor may also give you more specific instructions. If you have problems or questions, contact your doctor. Follow these instructions at home: Insertion site care   Follow instructions from your doctor about how to take care of your long, thin tube (catheter) insertion area. Make sure you:  Wash your hands with soap and water before you change your bandage (dressing). If you cannot use soap and water, use hand sanitizer.  Change your bandage as told by your doctor.  Leave stitches (sutures), skin glue, or skin tape (adhesive) strips in place. They may need to stay in place for 2 weeks or longer. If tape strips get loose and curl up, you may trim the loose edges. Do not remove tape strips completely unless your doctor says it is okay.  Do not take baths, swim, or use a hot tub until your doctor says it is okay.  You may shower 24-48 hours after the procedure or as told by your doctor.  Gently wash the area with plain soap and water.  Pat the area dry with a clean towel.  Do not rub the area. This may cause bleeding.  Do not apply powder or lotion to the area. Keep the area clean and dry.  Check your insertion area every day for signs of infection. Check for:  More  redness, swelling, or pain.  Fluid or blood.  Warmth.  Pus or a bad smell. Activity   Rest as told by your doctor, usually for 1-2 days.  Do not lift anything that is heavier than 10 lbs. (4.5 kg) or as told by your doctor.  Do not drive for 24 hours if you were given a medicine to help you relax (sedative).  Do not drive or use heavy machinery while taking prescription pain medicine. General instructions   Go  back to your normal activities as told by your doctor, usually in about a week. Ask your doctor what activities are safe for you.  If the insertion area starts to bleed, lie flat and put pressure on the area. If the bleeding does not stop, get help right away. This is an emergency.  Drink enough fluid to keep your pee (urine) clear or pale yellow.  Take over-the-counter and prescription medicines only as told by your doctor.  Keep all follow-up visits as told by your doctor. This is important. Contact a doctor if:  You have a fever.  You have chills.  You have more redness, swelling, or pain around your insertion area.  You have fluid or blood coming from your insertion area.  The insertion area feels warm to the touch.  You have pus or a bad smell coming from your insertion area.  You have more bruising around the insertion area.  Blood collects in the tissue around the insertion area (hematoma) that may be painful to the touch. Get help right away if:  You have a lot of pain in the insertion area.  The insertion area swells very fast.  The insertion area is bleeding, and the bleeding does not stop after holding steady pressure on the area.  The area near or just beyond the insertion area becomes pale, cool, tingly, or numb. These symptoms may be an emergency. Do not wait to see if the symptoms will go away. Get medical help right away. Call your local emergency services (911 in the [place] Do not drive yourself to the hospital. Summary  After the procedure, it is common to have bruising and tenderness at the long, thin tube insertion area.  After the procedure, it is important to rest and drink plenty of fluids.  Do not take baths, swim, or use a hot tub until your doctor says it is okay to do so. You may shower 24-48 hours after the procedure or as told by your doctor.  If the insertion area starts to bleed, lie flat and put pressure on the area. If the bleeding does not  stop, get help right away. This is an emergency. This information is not intended to replace advice given to you by your health care provider. Make sure you discuss any questions you have with your health care provider. Document Released: 07/27/2008 Document Revised: 04/24/2016 Document Reviewed: 04/24/2016 Elsevier Interactive Patient Education  2017 [employer].

## 2016-09-14 DIAGNOSIS — C220 Liver cell carcinoma: Secondary | ICD-10-CM | POA: Diagnosis not present

## 2016-09-14 NOTE — Discharge Summary (Signed)
Patient ID: Joshua [name] MRN: [id number] DOB/AGE: 12/12/1955 61 y.o.  Admit date: 09/13/2016 Discharge date: 09/14/2016  Supervising Physician: Joshua [name]  Patient Status: [facility] - In-pt  Admission Diagnoses: Multifocal hepatocellular carcinoma  Discharge Diagnoses:  Active Problems:   Hepatocellular carcinoma [facility])   Discharged Condition: good  Hospital Course:  Joshua [name] is a 61 year old male with past medical history of alcoholic cirrhosis, HTN, hepatitis C, tobacco abuse, and multifocal hepatocellular carcinoma.  Patient was seen in consultation with Dr. Ronny [name] 08/30/16 and deemed an appropriate candidate for bland embolization of the [facility] prior to microwave ablation.  Patient presented to [facility] 09/13/16 for procedure which was successful and without immediate complication.  Patient was admitted for observation overnight.  He has been able to advance his diet with tolerance, ambulate in the halls, and void without difficulty.  Patient denies pain or concerns at this time.  He is overall feeling well and is ready for discharge.  Patient is aware of his follow-up appointment with Dr. Pascal [name] 09/26/16.  His mother is available today for care at home.   Consults: None  Treatments: Bland embolization prior to microwave ablation  Discharge Exam: Blood pressure 128/73, pulse 60, temperature 98.2 F (36.8 C), temperature source Oral, resp. rate 14, weight 150 lb (68 kg), SpO2 95 %. General appearance: alert and no distress Resp: no respiratory distress or increased work of breathing Cardio: regular rate and rhythm Skin: Skin color, texture, turgor normal. No rashes or lesions or Groin site intact and soft.  No evidence of hematoma or pseudoaneurysm.  Disposition: 01-Home or Self Care  Discharge Instructions    Call MD for:  difficulty breathing, headache or visual disturbances    Complete by:  As directed    Call MD for:  extreme fatigue    Complete by:  As directed    Call MD for:   hives    Complete by:  As directed    Call MD for:  persistant dizziness or light-headedness    Complete by:  As directed    Call MD for:  persistant nausea and vomiting    Complete by:  As directed    Call MD for:  redness, tenderness, or signs of infection (pain, swelling, redness, odor or green/yellow discharge around incision site)    Complete by:  As directed    Call MD for:  severe uncontrolled pain    Complete by:  As directed    Call MD for:  temperature >100.4    Complete by:  As directed    Diet - low sodium heart healthy    Complete by:  As directed    Discharge instructions    Complete by:  As directed    Follow-up with Dr. Ronny [name] on 09/26/16 in interventional radiology clinic.   Increase activity slowly    Complete by:  As directed    Do not drive if taking narcotics.     Allergies as of 09/14/2016   No Known Allergies     Medication List    TAKE these medications   b complex vitamins capsule Take 1 capsule by mouth daily.   lisinopril-hydrochlorothiazide 10-12.5 MG tablet Commonly known as:  PRINZIDE,ZESTORETIC TAKE 1 TABLET BY MOUTH EVERY DAY   multivitamin with minerals Tabs tablet Take 1 tablet by mouth daily.   sertraline 100 MG tablet Commonly known as:  ZOLOFT TAKE 1 TABLET BY MOUTH EVERY DAY   traZODone 50 MG tablet Commonly known as:  DESYREL TAKE 2 TABLETS BY MOUTH AT BEDTIME      Follow-up Information    Joshua Mariscal, MD Follow up.   Specialty:  Interventional Radiology Why:  Follow-up with Dr. Pascal [name] 09/26/16 in interventional radiology clinic.  Contact information: [address] [place] [id number] [phone]            Electronically Signed: Docia [name] 09/14/2016, 10:17 AM   I have spent Greater Than 30 Minutes discharging Joshua [name].

## 2016-09-14 NOTE — Progress Notes (Signed)
Patient discharged to home with via family car.

## 2016-09-17 ENCOUNTER — Other Ambulatory Visit: Payer: Self-pay | Admitting: *Deleted

## 2016-09-17 DIAGNOSIS — C220 Liver cell carcinoma: Secondary | ICD-10-CM

## 2016-09-24 LAB — HEPATIC FUNCTION PANEL
ALT: 45 U/L (ref 9–46)
AST: 82 U/L — AB (ref 10–35)
Albumin: 3.7 g/dL (ref 3.6–5.1)
Alkaline Phosphatase: 204 U/L — ABNORMAL HIGH (ref 40–115)
BILIRUBIN DIRECT: 0.2 mg/dL (ref <=0.2)
BILIRUBIN INDIRECT: 0.2 mg/dL (ref 0.2–1.2)
Total Bilirubin: 0.4 mg/dL (ref 0.2–1.2)
Total Protein: 7.3 g/dL (ref 6.1–8.1)

## 2016-09-25 ENCOUNTER — Other Ambulatory Visit: Payer: Self-pay | Admitting: Internal Medicine

## 2016-09-25 LAB — AFP TUMOR MARKER: AFP-Tumor Marker: 5.2 ng/mL (ref <6.1)

## 2016-09-26 ENCOUNTER — Ambulatory Visit
Admission: RE | Admit: 2016-09-26 | Discharge: 2016-09-26 | Disposition: A | Discharge status: Home / Self Care | Payer: BLUE CROSS/BLUE SHIELD | Source: Ambulatory Visit | Attending: Interventional Radiology | Admitting: Interventional Radiology

## 2016-09-26 DIAGNOSIS — C220 Liver cell carcinoma: Secondary | ICD-10-CM

## 2016-09-26 DIAGNOSIS — K7030 Alcoholic cirrhosis of liver without ascites: Secondary | ICD-10-CM

## 2016-09-26 HISTORY — PX: IR RADIOLOGIST EVAL & MGMT: IMG5224

## 2016-09-26 NOTE — Progress Notes (Signed)
Patient ID: Joshua [name], male   DOB: 11-16-1955, 61 y.o.   MRN: [id number]         Chief Complaint: [facility]  Referring Physician(s): Danis (GI) Drazek (GI) Burr Medico (Oncology)  History of Present Illness: Joshua [name] is a 61 y.o. male with past history significant for hypertension, smoking, hepatitis C and alcoholic cirrhosis who was found to have indeterminate liver lesions on abdominal ultrasound performed on 07/12/2016 obtained for a mildly elevated AFP level of 5.3 (obtained on 06/28/2016). Subsequent abdominal MRI performed 07/16/2016 demonstrated imaging findings worrisome for 2 discrete areas of hepatocellular carcinoma.   The patient was initially seen in consultation on 08/21/2016 and underwent technically successful Dyna CT augmented percutaneous bland embolization of both dominant hepatic lesions. He returns today to the interventional radiology clinic for postprocedural evaluation and management prior to proceeding with microwave liver ablation scheduled for next Friday, 5/25. He is accompanied by his mother and stepfather this serves as his own historian.  Patient has recovered completely from the bland embolization and is without complaint. No abdominal pain. No worsening abdominal girth. No fever or chills.    Past Medical History:  Diagnosis Date  . Chronic alcoholism ([place])   . Cirrhosis ([place])   . Depression   . Glucose intolerance (impaired glucose tolerance)   . Hepatitis C   . Hypertension   . Tobacco use   . Umbilical hernia     Past Surgical History:  Procedure Laterality Date  . CATARACT EXTRACTION    . COLONOSCOPY  12/09  . INGUINAL HERNIA REPAIR    . IR 3D INDEPENDENT WKST  09/13/2016  . IR [place] ADDITIONAL VESSEL  09/13/2016  . IR [place] ADDITIONAL VESSEL  09/13/2016  . IR [place] ADDITIONAL VESSEL  09/13/2016  . IR [place] ADDITIONAL VESSEL  09/13/2016  . IR [place] ADDITIONAL  VESSEL  09/13/2016  . IR ANGIOGRAM VISCERAL SELECTIVE  09/13/2016  . IR EMBO TUMOR ORGAN ISCHEMIA INFARCT INC GUIDE ROADMAPPING  09/13/2016  . IR US GUIDE VASC ACCESS RIGHT  09/13/2016  . left orchiectomy  07/2009  . UMBILICAL HERNIA REPAIR      Allergies: Patient has no known allergies.  Medications: Prior to Admission medications   Medication Sig Start Date End Date Taking? Authorizing Provider  b complex vitamins capsule Take 1 capsule by mouth daily.   Yes [provider]  lisinopril-hydrochlorothiazide (PRINZIDE,ZESTORETIC) 10-12.5 MG tablet TAKE 1 TABLET BY MOUTH EVERY DAY 04/18/16  Yes Marletta Lor, MD  Multiple Vitamin (MULTIVITAMIN WITH MINERALS) TABS tablet Take 1 tablet by mouth daily.   Yes [provider]  sertraline (ZOLOFT) 100 MG tablet TAKE 1 TABLET BY MOUTH EVERY DAY 05/29/16  Yes Marletta Lor, MD  traZODone (DESYREL) 50 MG tablet TAKE 2 TABLETS BY MOUTH AT BEDTIME 09/26/16  Yes Marletta Lor, MD     Family History  Problem Relation Age of Onset  . Healthy Mother   . Colon cancer Neg Hx   . Stomach cancer Neg Hx     Social History   Social History  . Marital status: Single    Spouse name: N/A  . Number of children: N/A  . Years of education: N/A   Social History Main Topics  . Smoking status: Current Every Day Smoker    Packs/day: 1.00    Years: 42.00    Types: Cigarettes    Start date: 05/21/1971  . Smokeless tobacco: Never  Used     Comment: Has cut down to about 1/3 pack per day  . Alcohol use No     Comment: he used to drink beer 6 pack a day since age of 69, stopped in 07/2016  . Drug use: Yes  . Sexual activity: Not on file   Other Topics Concern  . Not on file   Social History Narrative  . No narrative on file    ECOG Status: 0 - Asymptomatic  Review of Systems: A 12 point ROS discussed and pertinent positives are indicated in the HPI above.  All other systems are negative.  Review of Systems    Constitutional: Negative for activity change, appetite change, fatigue, fever and unexpected weight change.  Respiratory: Negative.   Cardiovascular: Negative.   Gastrointestinal: Negative for abdominal distention and abdominal pain.  Skin: Negative.   Psychiatric/Behavioral: Negative.     Vital Signs: BP (!) 160/98   Pulse 76   Temp 98.4 F (36.9 C) (Oral)   Ht '5\' 11"'$  (1.803 m)   Wt 154 lb (69.9 kg)   SpO2 100%   BMI 21.48 kg/m   Physical Exam  Constitutional: He appears well-developed and well-nourished.  HENT:  Head: Normocephalic and atraumatic.  Cardiovascular:  Well healed right groin puncture sight without appreciable hematoma.  Skin:  Bruising is noted about the bilateral forearms.  Psychiatric: He has a normal mood and affect. His behavior is normal.  Nursing note and vitals reviewed.   Imaging: Ir Angiogram Visceral Selective  Result Date: 09/13/2016 INDICATION: History of multifocal hepatocellular carcinoma. Patient presents today for pole and embolization of both common hepatic lesions in hopes of improved tumor control prior to potential percutaneous microwave ablation. EXAM: 1. ULTRASOUND GUIDANCE FOR ARTERIAL ACCESS 2. SELECTIVE CELIAC ARTERIOGRAM 3. SELECTIVE PROPER HEPATIC ARTERIOGRAM 4. SELECTIVE SUBSEGMENTAL POSTERIOR HEPATIC ARTERIOGRAM, DYNA CT AND PERCUTANEOUS PARTICLE EMBOLIZATION 5. SELECTIVE SUBSEGMENTAL ANTERIOR HEPATIC ARTERIOGRAM, DYNA CT AND PERCUTANEOUS PARTICLE EMBOLIZATION X2 6. WORKSTATION 3D RECONSTRUCTION MEDICATIONS: Ancef 2 gm IV. The antibiotic was administered within 1 hour of the procedure ANESTHESIA/SEDATION: Moderate (conscious) sedation was employed during this procedure. A total of Versed 6 mg and Fentanyl 300 mcg was administered intravenously. Moderate Sedation Time: 140 minutes. The patient's level of consciousness and vital signs were monitored continuously by radiology nursing throughout the procedure under my direct supervision.  CONTRAST:  130 cc Isovue 300 FLUOROSCOPY TIME:  Fluoroscopy Time: 22 minutes 30 seconds (2,967 mGy). COMPLICATIONS: None immediate. PROCEDURE: Informed consent was obtained from the patient following explanation of the procedure, risks, benefits and alternatives. The patient understands, agrees and consents for the procedure. All questions were addressed. A time out was performed prior to the initiation of the procedure. Maximal barrier sterile technique utilized including caps, mask, sterile gowns, sterile gloves, large sterile drape, hand hygiene, and Betadine prep. The right femoral head was marked fluoroscopically . Under ultrasound guidance, the right common femoral artery was accessed with a micropuncture kit after the overlying soft tissues were anesthetized with 1% lidocaine. An ultrasound image was saved for documentation purposes. The micropuncture sheath was exchanged for a 5 [place] vascular sheath over a Bentson wire. A closure arteriogram was performed through the side of the sheath confirming access within the right common femoral artery. Over a Bentson wire, a Mickelson catheter was advanced to the level of the thoracic aorta where it was back bled and flushed. The catheter was then utilized to select the celiac artery and a selective celiac arteriogram was performed. With  the use of a Fathom 14 micro wire, a high-flow Renegade microcatheter was advanced into the proper hepatic artery and a common hepatic arteriogram was performed. The microcatheter was then advanced to select a subsegmental branch of the posterior division of the right hepatic artery which was favored to supply via the dominant ill-defined infiltrative mass within the inferior aspect the right lobe of the liver. Appropriate positioning was confirmed with the acquisition of the Dyna CT. From this location, the vessel was embolized with 70 - 150 micron Lumi LC Beads until vessel stasis was achieved. The microcatheter was retracted and  a post embolization arteriogram was performed. The microcatheter was then advanced into an additional segmental branch of the posterior division of the right hepatic artery which was thought to supply the additional hyperenhancing lesion within the dome of the right lobe of the liver and a sub selective arteriogram was performed. Dyna CT imaging was obtained at this location, however it became evident the additional hypervascular lesion was not supplied by this vascular distribution. As such, the microcatheter was advanced into the anterior division of the right hepatic artery and sub selective arteriogram was performed. Dyna CT imaging was obtained at this location demonstrating supply to the additional hyperenhancing lesion within the dome of the right lobe of the liver. A subsegmental branch of the anterior division of the right hepatic artery supplying a portion of the hyperenhancing hepatic lesion was selected and sub selective injection was performed. This vessel was then embolized with the administration of 70 - 150 micron Lumi LC Beads until vessel stasis was achieved. The microcatheter was retracted and a post embolization arteriogram was performed. The microcatheter was then utilized to on additional branch of the anterior division of the right hepatic artery supplying the remaining portion of the hypervascular hepatic lesion and a sub selective injection was performed. This vessel was then embolized with the administration of 70 - 150 micron Lumi LC Beads until vessel stasis was achieved. The microcatheter was retracted and a post embolization arteriogram was performed. At this point the procedure was terminated. All wires, catheters and sheaths were removed from the patient. Hemostasis was achieved at the right groin access site with manual compression. A dressing was placed. The patient tolerated the procedure well without immediate postprocedural complication. FINDINGS: Celiac arteriogram demonstrates  non-conventional branching pattern of the common hepatic artery with separate origins of the medial and lateral segmental left hepatic arteries. Sub selective injection of a distal branch vessel of the posterior segmental branch of the right hepatic artery confirmed arterial supply to the infiltrative lesion within the posterior aspect of the right lobe of the liver. This finding was confirmed with Dyna CT and the vascular distribution was successfully particle embolized with Lumi LC Beads. Dyna CT imaging of selective injection of the anterior branch of the right hepatic artery confirmed arterial supply by two separate subsegmental vessels, both of which were successfully catheterized and percutaneously particle embolized with Lumi LC Beads. IMPRESSION: Technically successful particle embolization of both dominant hyperenhancing hepatic lesions with Lumi LC Beads. PLAN: - The patient will be observed overnight for continued observation in PCA usage. - The patient will return to the interventional radiology clinic in 3 weeks for postprocedural evaluation and management prior to potential percutaneous microwave ablation. LFTs and an AFP level will be obtained at that time. Electronically Signed   By: Sandi Mariscal M.D.   On: 09/13/2016 18:45   Ir Angiogram Selective Each Additional Vessel  Result Date: 09/13/2016 INDICATION:  History of multifocal hepatocellular carcinoma. Patient presents today for pole and embolization of both common hepatic lesions in hopes of improved tumor control prior to potential percutaneous microwave ablation. EXAM: 1. ULTRASOUND GUIDANCE FOR ARTERIAL ACCESS 2. SELECTIVE CELIAC ARTERIOGRAM 3. SELECTIVE PROPER HEPATIC ARTERIOGRAM 4. SELECTIVE SUBSEGMENTAL POSTERIOR HEPATIC ARTERIOGRAM, DYNA CT AND PERCUTANEOUS PARTICLE EMBOLIZATION 5. SELECTIVE SUBSEGMENTAL ANTERIOR HEPATIC ARTERIOGRAM, DYNA CT AND PERCUTANEOUS PARTICLE EMBOLIZATION X2 6. WORKSTATION 3D RECONSTRUCTION MEDICATIONS: Ancef 2 gm  IV. The antibiotic was administered within 1 hour of the procedure ANESTHESIA/SEDATION: Moderate (conscious) sedation was employed during this procedure. A total of Versed 6 mg and Fentanyl 300 mcg was administered intravenously. Moderate Sedation Time: 140 minutes. The patient's level of consciousness and vital signs were monitored continuously by radiology nursing throughout the procedure under my direct supervision. CONTRAST:  130 cc Isovue 300 FLUOROSCOPY TIME:  Fluoroscopy Time: 22 minutes 30 seconds (2,967 mGy). COMPLICATIONS: None immediate. PROCEDURE: Informed consent was obtained from the patient following explanation of the procedure, risks, benefits and alternatives. The patient understands, agrees and consents for the procedure. All questions were addressed. A time out was performed prior to the initiation of the procedure. Maximal barrier sterile technique utilized including caps, mask, sterile gowns, sterile gloves, large sterile drape, hand hygiene, and Betadine prep. The right femoral head was marked fluoroscopically . Under ultrasound guidance, the right common femoral artery was accessed with a micropuncture kit after the overlying soft tissues were anesthetized with 1% lidocaine. An ultrasound image was saved for documentation purposes. The micropuncture sheath was exchanged for a 5 [place] vascular sheath over a Bentson wire. A closure arteriogram was performed through the side of the sheath confirming access within the right common femoral artery. Over a Bentson wire, a Mickelson catheter was advanced to the level of the thoracic aorta where it was back bled and flushed. The catheter was then utilized to select the celiac artery and a selective celiac arteriogram was performed. With the use of a Fathom 14 micro wire, a high-flow Renegade microcatheter was advanced into the proper hepatic artery and a common hepatic arteriogram was performed. The microcatheter was then advanced to select a  subsegmental branch of the posterior division of the right hepatic artery which was favored to supply via the dominant ill-defined infiltrative mass within the inferior aspect the right lobe of the liver. Appropriate positioning was confirmed with the acquisition of the Dyna CT. From this location, the vessel was embolized with 70 - 150 micron Lumi LC Beads until vessel stasis was achieved. The microcatheter was retracted and a post embolization arteriogram was performed. The microcatheter was then advanced into an additional segmental branch of the posterior division of the right hepatic artery which was thought to supply the additional hyperenhancing lesion within the dome of the right lobe of the liver and a sub selective arteriogram was performed. Dyna CT imaging was obtained at this location, however it became evident the additional hypervascular lesion was not supplied by this vascular distribution. As such, the microcatheter was advanced into the anterior division of the right hepatic artery and sub selective arteriogram was performed. Dyna CT imaging was obtained at this location demonstrating supply to the additional hyperenhancing lesion within the dome of the right lobe of the liver. A subsegmental branch of the anterior division of the right hepatic artery supplying a portion of the hyperenhancing hepatic lesion was selected and sub selective injection was performed. This vessel was then embolized with the administration of 70 - 150 micron Lumi  LC Beads until vessel stasis was achieved. The microcatheter was retracted and a post embolization arteriogram was performed. The microcatheter was then utilized to on additional branch of the anterior division of the right hepatic artery supplying the remaining portion of the hypervascular hepatic lesion and a sub selective injection was performed. This vessel was then embolized with the administration of 70 - 150 micron Lumi LC Beads until vessel stasis was  achieved. The microcatheter was retracted and a post embolization arteriogram was performed. At this point the procedure was terminated. All wires, catheters and sheaths were removed from the patient. Hemostasis was achieved at the right groin access site with manual compression. A dressing was placed. The patient tolerated the procedure well without immediate postprocedural complication. FINDINGS: Celiac arteriogram demonstrates non-conventional branching pattern of the common hepatic artery with separate origins of the medial and lateral segmental left hepatic arteries. Sub selective injection of a distal branch vessel of the posterior segmental branch of the right hepatic artery confirmed arterial supply to the infiltrative lesion within the posterior aspect of the right lobe of the liver. This finding was confirmed with Dyna CT and the vascular distribution was successfully particle embolized with Lumi LC Beads. Dyna CT imaging of selective injection of the anterior branch of the right hepatic artery confirmed arterial supply by two separate subsegmental vessels, both of which were successfully catheterized and percutaneously particle embolized with Lumi LC Beads. IMPRESSION: Technically successful particle embolization of both dominant hyperenhancing hepatic lesions with Lumi LC Beads. PLAN: - The patient will be observed overnight for continued observation in PCA usage. - The patient will return to the interventional radiology clinic in 3 weeks for postprocedural evaluation and management prior to potential percutaneous microwave ablation. LFTs and an AFP level will be obtained at that time. Electronically Signed   By: Sandi Mariscal M.D.   On: 09/13/2016 18:45   Ir Angiogram Selective Each Additional Vessel  Result Date: 09/13/2016 INDICATION: History of multifocal hepatocellular carcinoma. Patient presents today for pole and embolization of both common hepatic lesions in hopes of improved tumor control prior  to potential percutaneous microwave ablation. EXAM: 1. ULTRASOUND GUIDANCE FOR ARTERIAL ACCESS 2. SELECTIVE CELIAC ARTERIOGRAM 3. SELECTIVE PROPER HEPATIC ARTERIOGRAM 4. SELECTIVE SUBSEGMENTAL POSTERIOR HEPATIC ARTERIOGRAM, DYNA CT AND PERCUTANEOUS PARTICLE EMBOLIZATION 5. SELECTIVE SUBSEGMENTAL ANTERIOR HEPATIC ARTERIOGRAM, DYNA CT AND PERCUTANEOUS PARTICLE EMBOLIZATION X2 6. WORKSTATION 3D RECONSTRUCTION MEDICATIONS: Ancef 2 gm IV. The antibiotic was administered within 1 hour of the procedure ANESTHESIA/SEDATION: Moderate (conscious) sedation was employed during this procedure. A total of Versed 6 mg and Fentanyl 300 mcg was administered intravenously. Moderate Sedation Time: 140 minutes. The patient's level of consciousness and vital signs were monitored continuously by radiology nursing throughout the procedure under my direct supervision. CONTRAST:  130 cc Isovue 300 FLUOROSCOPY TIME:  Fluoroscopy Time: 22 minutes 30 seconds (2,967 mGy). COMPLICATIONS: None immediate. PROCEDURE: Informed consent was obtained from the patient following explanation of the procedure, risks, benefits and alternatives. The patient understands, agrees and consents for the procedure. All questions were addressed. A time out was performed prior to the initiation of the procedure. Maximal barrier sterile technique utilized including caps, mask, sterile gowns, sterile gloves, large sterile drape, hand hygiene, and Betadine prep. The right femoral head was marked fluoroscopically . Under ultrasound guidance, the right common femoral artery was accessed with a micropuncture kit after the overlying soft tissues were anesthetized with 1% lidocaine. An ultrasound image was saved for documentation purposes. The micropuncture sheath was exchanged  for a 5 [place] vascular sheath over a Bentson wire. A closure arteriogram was performed through the side of the sheath confirming access within the right common femoral artery. Over a Bentson wire, a  Mickelson catheter was advanced to the level of the thoracic aorta where it was back bled and flushed. The catheter was then utilized to select the celiac artery and a selective celiac arteriogram was performed. With the use of a Fathom 14 micro wire, a high-flow Renegade microcatheter was advanced into the proper hepatic artery and a common hepatic arteriogram was performed. The microcatheter was then advanced to select a subsegmental branch of the posterior division of the right hepatic artery which was favored to supply via the dominant ill-defined infiltrative mass within the inferior aspect the right lobe of the liver. Appropriate positioning was confirmed with the acquisition of the Dyna CT. From this location, the vessel was embolized with 70 - 150 micron Lumi LC Beads until vessel stasis was achieved. The microcatheter was retracted and a post embolization arteriogram was performed. The microcatheter was then advanced into an additional segmental branch of the posterior division of the right hepatic artery which was thought to supply the additional hyperenhancing lesion within the dome of the right lobe of the liver and a sub selective arteriogram was performed. Dyna CT imaging was obtained at this location, however it became evident the additional hypervascular lesion was not supplied by this vascular distribution. As such, the microcatheter was advanced into the anterior division of the right hepatic artery and sub selective arteriogram was performed. Dyna CT imaging was obtained at this location demonstrating supply to the additional hyperenhancing lesion within the dome of the right lobe of the liver. A subsegmental branch of the anterior division of the right hepatic artery supplying a portion of the hyperenhancing hepatic lesion was selected and sub selective injection was performed. This vessel was then embolized with the administration of 70 - 150 micron Lumi LC Beads until vessel stasis was  achieved. The microcatheter was retracted and a post embolization arteriogram was performed. The microcatheter was then utilized to on additional branch of the anterior division of the right hepatic artery supplying the remaining portion of the hypervascular hepatic lesion and a sub selective injection was performed. This vessel was then embolized with the administration of 70 - 150 micron Lumi LC Beads until vessel stasis was achieved. The microcatheter was retracted and a post embolization arteriogram was performed. At this point the procedure was terminated. All wires, catheters and sheaths were removed from the patient. Hemostasis was achieved at the right groin access site with manual compression. A dressing was placed. The patient tolerated the procedure well without immediate postprocedural complication. FINDINGS: Celiac arteriogram demonstrates non-conventional branching pattern of the common hepatic artery with separate origins of the medial and lateral segmental left hepatic arteries. Sub selective injection of a distal branch vessel of the posterior segmental branch of the right hepatic artery confirmed arterial supply to the infiltrative lesion within the posterior aspect of the right lobe of the liver. This finding was confirmed with Dyna CT and the vascular distribution was successfully particle embolized with Lumi LC Beads. Dyna CT imaging of selective injection of the anterior branch of the right hepatic artery confirmed arterial supply by two separate subsegmental vessels, both of which were successfully catheterized and percutaneously particle embolized with Lumi LC Beads. IMPRESSION: Technically successful particle embolization of both dominant hyperenhancing hepatic lesions with Lumi LC Beads. PLAN: - The patient will be  observed overnight for continued observation in PCA usage. - The patient will return to the interventional radiology clinic in 3 weeks for postprocedural evaluation and  management prior to potential percutaneous microwave ablation. LFTs and an AFP level will be obtained at that time. Electronically Signed   By: Sandi Mariscal M.D.   On: 09/13/2016 18:45   Ir Angiogram Selective Each Additional Vessel  Result Date: 09/13/2016 INDICATION: History of multifocal hepatocellular carcinoma. Patient presents today for pole and embolization of both common hepatic lesions in hopes of improved tumor control prior to potential percutaneous microwave ablation. EXAM: 1. ULTRASOUND GUIDANCE FOR ARTERIAL ACCESS 2. SELECTIVE CELIAC ARTERIOGRAM 3. SELECTIVE PROPER HEPATIC ARTERIOGRAM 4. SELECTIVE SUBSEGMENTAL POSTERIOR HEPATIC ARTERIOGRAM, DYNA CT AND PERCUTANEOUS PARTICLE EMBOLIZATION 5. SELECTIVE SUBSEGMENTAL ANTERIOR HEPATIC ARTERIOGRAM, DYNA CT AND PERCUTANEOUS PARTICLE EMBOLIZATION X2 6. WORKSTATION 3D RECONSTRUCTION MEDICATIONS: Ancef 2 gm IV. The antibiotic was administered within 1 hour of the procedure ANESTHESIA/SEDATION: Moderate (conscious) sedation was employed during this procedure. A total of Versed 6 mg and Fentanyl 300 mcg was administered intravenously. Moderate Sedation Time: 140 minutes. The patient's level of consciousness and vital signs were monitored continuously by radiology nursing throughout the procedure under my direct supervision. CONTRAST:  130 cc Isovue 300 FLUOROSCOPY TIME:  Fluoroscopy Time: 22 minutes 30 seconds (2,967 mGy). COMPLICATIONS: None immediate. PROCEDURE: Informed consent was obtained from the patient following explanation of the procedure, risks, benefits and alternatives. The patient understands, agrees and consents for the procedure. All questions were addressed. A time out was performed prior to the initiation of the procedure. Maximal barrier sterile technique utilized including caps, mask, sterile gowns, sterile gloves, large sterile drape, hand hygiene, and Betadine prep. The right femoral head was marked fluoroscopically . Under ultrasound  guidance, the right common femoral artery was accessed with a micropuncture kit after the overlying soft tissues were anesthetized with 1% lidocaine. An ultrasound image was saved for documentation purposes. The micropuncture sheath was exchanged for a 5 [place] vascular sheath over a Bentson wire. A closure arteriogram was performed through the side of the sheath confirming access within the right common femoral artery. Over a Bentson wire, a Mickelson catheter was advanced to the level of the thoracic aorta where it was back bled and flushed. The catheter was then utilized to select the celiac artery and a selective celiac arteriogram was performed. With the use of a Fathom 14 micro wire, a high-flow Renegade microcatheter was advanced into the proper hepatic artery and a common hepatic arteriogram was performed. The microcatheter was then advanced to select a subsegmental branch of the posterior division of the right hepatic artery which was favored to supply via the dominant ill-defined infiltrative mass within the inferior aspect the right lobe of the liver. Appropriate positioning was confirmed with the acquisition of the Dyna CT. From this location, the vessel was embolized with 70 - 150 micron Lumi LC Beads until vessel stasis was achieved. The microcatheter was retracted and a post embolization arteriogram was performed. The microcatheter was then advanced into an additional segmental branch of the posterior division of the right hepatic artery which was thought to supply the additional hyperenhancing lesion within the dome of the right lobe of the liver and a sub selective arteriogram was performed. Dyna CT imaging was obtained at this location, however it became evident the additional hypervascular lesion was not supplied by this vascular distribution. As such, the microcatheter was advanced into the anterior division of the right hepatic artery and sub selective  arteriogram was performed. Dyna CT imaging  was obtained at this location demonstrating supply to the additional hyperenhancing lesion within the dome of the right lobe of the liver. A subsegmental branch of the anterior division of the right hepatic artery supplying a portion of the hyperenhancing hepatic lesion was selected and sub selective injection was performed. This vessel was then embolized with the administration of 70 - 150 micron Lumi LC Beads until vessel stasis was achieved. The microcatheter was retracted and a post embolization arteriogram was performed. The microcatheter was then utilized to on additional branch of the anterior division of the right hepatic artery supplying the remaining portion of the hypervascular hepatic lesion and a sub selective injection was performed. This vessel was then embolized with the administration of 70 - 150 micron Lumi LC Beads until vessel stasis was achieved. The microcatheter was retracted and a post embolization arteriogram was performed. At this point the procedure was terminated. All wires, catheters and sheaths were removed from the patient. Hemostasis was achieved at the right groin access site with manual compression. A dressing was placed. The patient tolerated the procedure well without immediate postprocedural complication. FINDINGS: Celiac arteriogram demonstrates non-conventional branching pattern of the common hepatic artery with separate origins of the medial and lateral segmental left hepatic arteries. Sub selective injection of a distal branch vessel of the posterior segmental branch of the right hepatic artery confirmed arterial supply to the infiltrative lesion within the posterior aspect of the right lobe of the liver. This finding was confirmed with Dyna CT and the vascular distribution was successfully particle embolized with Lumi LC Beads. Dyna CT imaging of selective injection of the anterior branch of the right hepatic artery confirmed arterial supply by two separate subsegmental  vessels, both of which were successfully catheterized and percutaneously particle embolized with Lumi LC Beads. IMPRESSION: Technically successful particle embolization of both dominant hyperenhancing hepatic lesions with Lumi LC Beads. PLAN: - The patient will be observed overnight for continued observation in PCA usage. - The patient will return to the interventional radiology clinic in 3 weeks for postprocedural evaluation and management prior to potential percutaneous microwave ablation. LFTs and an AFP level will be obtained at that time. Electronically Signed   By: Sandi Mariscal M.D.   On: 09/13/2016 18:45   Ir Angiogram Selective Each Additional Vessel  Result Date: 09/13/2016 INDICATION: History of multifocal hepatocellular carcinoma. Patient presents today for pole and embolization of both common hepatic lesions in hopes of improved tumor control prior to potential percutaneous microwave ablation. EXAM: 1. ULTRASOUND GUIDANCE FOR ARTERIAL ACCESS 2. SELECTIVE CELIAC ARTERIOGRAM 3. SELECTIVE PROPER HEPATIC ARTERIOGRAM 4. SELECTIVE SUBSEGMENTAL POSTERIOR HEPATIC ARTERIOGRAM, DYNA CT AND PERCUTANEOUS PARTICLE EMBOLIZATION 5. SELECTIVE SUBSEGMENTAL ANTERIOR HEPATIC ARTERIOGRAM, DYNA CT AND PERCUTANEOUS PARTICLE EMBOLIZATION X2 6. WORKSTATION 3D RECONSTRUCTION MEDICATIONS: Ancef 2 gm IV. The antibiotic was administered within 1 hour of the procedure ANESTHESIA/SEDATION: Moderate (conscious) sedation was employed during this procedure. A total of Versed 6 mg and Fentanyl 300 mcg was administered intravenously. Moderate Sedation Time: 140 minutes. The patient's level of consciousness and vital signs were monitored continuously by radiology nursing throughout the procedure under my direct supervision. CONTRAST:  130 cc Isovue 300 FLUOROSCOPY TIME:  Fluoroscopy Time: 22 minutes 30 seconds (2,967 mGy). COMPLICATIONS: None immediate. PROCEDURE: Informed consent was obtained from the patient following explanation of  the procedure, risks, benefits and alternatives. The patient understands, agrees and consents for the procedure. All questions were addressed. A time out was performed prior  to the initiation of the procedure. Maximal barrier sterile technique utilized including caps, mask, sterile gowns, sterile gloves, large sterile drape, hand hygiene, and Betadine prep. The right femoral head was marked fluoroscopically . Under ultrasound guidance, the right common femoral artery was accessed with a micropuncture kit after the overlying soft tissues were anesthetized with 1% lidocaine. An ultrasound image was saved for documentation purposes. The micropuncture sheath was exchanged for a 5 [place] vascular sheath over a Bentson wire. A closure arteriogram was performed through the side of the sheath confirming access within the right common femoral artery. Over a Bentson wire, a Mickelson catheter was advanced to the level of the thoracic aorta where it was back bled and flushed. The catheter was then utilized to select the celiac artery and a selective celiac arteriogram was performed. With the use of a Fathom 14 micro wire, a high-flow Renegade microcatheter was advanced into the proper hepatic artery and a common hepatic arteriogram was performed. The microcatheter was then advanced to select a subsegmental branch of the posterior division of the right hepatic artery which was favored to supply via the dominant ill-defined infiltrative mass within the inferior aspect the right lobe of the liver. Appropriate positioning was confirmed with the acquisition of the Dyna CT. From this location, the vessel was embolized with 70 - 150 micron Lumi LC Beads until vessel stasis was achieved. The microcatheter was retracted and a post embolization arteriogram was performed. The microcatheter was then advanced into an additional segmental branch of the posterior division of the right hepatic artery which was thought to supply the additional  hyperenhancing lesion within the dome of the right lobe of the liver and a sub selective arteriogram was performed. Dyna CT imaging was obtained at this location, however it became evident the additional hypervascular lesion was not supplied by this vascular distribution. As such, the microcatheter was advanced into the anterior division of the right hepatic artery and sub selective arteriogram was performed. Dyna CT imaging was obtained at this location demonstrating supply to the additional hyperenhancing lesion within the dome of the right lobe of the liver. A subsegmental branch of the anterior division of the right hepatic artery supplying a portion of the hyperenhancing hepatic lesion was selected and sub selective injection was performed. This vessel was then embolized with the administration of 70 - 150 micron Lumi LC Beads until vessel stasis was achieved. The microcatheter was retracted and a post embolization arteriogram was performed. The microcatheter was then utilized to on additional branch of the anterior division of the right hepatic artery supplying the remaining portion of the hypervascular hepatic lesion and a sub selective injection was performed. This vessel was then embolized with the administration of 70 - 150 micron Lumi LC Beads until vessel stasis was achieved. The microcatheter was retracted and a post embolization arteriogram was performed. At this point the procedure was terminated. All wires, catheters and sheaths were removed from the patient. Hemostasis was achieved at the right groin access site with manual compression. A dressing was placed. The patient tolerated the procedure well without immediate postprocedural complication. FINDINGS: Celiac arteriogram demonstrates non-conventional branching pattern of the common hepatic artery with separate origins of the medial and lateral segmental left hepatic arteries. Sub selective injection of a distal branch vessel of the posterior  segmental branch of the right hepatic artery confirmed arterial supply to the infiltrative lesion within the posterior aspect of the right lobe of the liver. This finding was confirmed with Dyna  CT and the vascular distribution was successfully particle embolized with Lumi LC Beads. Dyna CT imaging of selective injection of the anterior branch of the right hepatic artery confirmed arterial supply by two separate subsegmental vessels, both of which were successfully catheterized and percutaneously particle embolized with Lumi LC Beads. IMPRESSION: Technically successful particle embolization of both dominant hyperenhancing hepatic lesions with Lumi LC Beads. PLAN: - The patient will be observed overnight for continued observation in PCA usage. - The patient will return to the interventional radiology clinic in 3 weeks for postprocedural evaluation and management prior to potential percutaneous microwave ablation. LFTs and an AFP level will be obtained at that time. Electronically Signed   By: Simonne Come M.D.   On: 09/13/2016 18:45   Ir Angiogram Selective Each Additional Vessel  Result Date: 09/13/2016 INDICATION: History of multifocal hepatocellular carcinoma. Patient presents today for pole and embolization of both common hepatic lesions in hopes of improved tumor control prior to potential percutaneous microwave ablation. EXAM: 1. ULTRASOUND GUIDANCE FOR ARTERIAL ACCESS 2. SELECTIVE CELIAC ARTERIOGRAM 3. SELECTIVE PROPER HEPATIC ARTERIOGRAM 4. SELECTIVE SUBSEGMENTAL POSTERIOR HEPATIC ARTERIOGRAM, DYNA CT AND PERCUTANEOUS PARTICLE EMBOLIZATION 5. SELECTIVE SUBSEGMENTAL ANTERIOR HEPATIC ARTERIOGRAM, DYNA CT AND PERCUTANEOUS PARTICLE EMBOLIZATION X2 6. WORKSTATION 3D RECONSTRUCTION MEDICATIONS: Ancef 2 gm IV. The antibiotic was administered within 1 hour of the procedure ANESTHESIA/SEDATION: Moderate (conscious) sedation was employed during this procedure. A total of Versed 6 mg and Fentanyl 300 mcg was  administered intravenously. Moderate Sedation Time: 140 minutes. The patient's level of consciousness and vital signs were monitored continuously by radiology nursing throughout the procedure under my direct supervision. CONTRAST:  130 cc Isovue 300 FLUOROSCOPY TIME:  Fluoroscopy Time: 22 minutes 30 seconds (2,967 mGy). COMPLICATIONS: None immediate. PROCEDURE: Informed consent was obtained from the patient following explanation of the procedure, risks, benefits and alternatives. The patient understands, agrees and consents for the procedure. All questions were addressed. A time out was performed prior to the initiation of the procedure. Maximal barrier sterile technique utilized including caps, mask, sterile gowns, sterile gloves, large sterile drape, hand hygiene, and Betadine prep. The right femoral head was marked fluoroscopically . Under ultrasound guidance, the right common femoral artery was accessed with a micropuncture kit after the overlying soft tissues were anesthetized with 1% lidocaine. An ultrasound image was saved for documentation purposes. The micropuncture sheath was exchanged for a 5 [place] vascular sheath over a Bentson wire. A closure arteriogram was performed through the side of the sheath confirming access within the right common femoral artery. Over a Bentson wire, a Mickelson catheter was advanced to the level of the thoracic aorta where it was back bled and flushed. The catheter was then utilized to select the celiac artery and a selective celiac arteriogram was performed. With the use of a Fathom 14 micro wire, a high-flow Renegade microcatheter was advanced into the proper hepatic artery and a common hepatic arteriogram was performed. The microcatheter was then advanced to select a subsegmental branch of the posterior division of the right hepatic artery which was favored to supply via the dominant ill-defined infiltrative mass within the inferior aspect the right lobe of the liver.  Appropriate positioning was confirmed with the acquisition of the Dyna CT. From this location, the vessel was embolized with 70 - 150 micron Lumi LC Beads until vessel stasis was achieved. The microcatheter was retracted and a post embolization arteriogram was performed. The microcatheter was then advanced into an additional segmental branch of the posterior division of  the right hepatic artery which was thought to supply the additional hyperenhancing lesion within the dome of the right lobe of the liver and a sub selective arteriogram was performed. Dyna CT imaging was obtained at this location, however it became evident the additional hypervascular lesion was not supplied by this vascular distribution. As such, the microcatheter was advanced into the anterior division of the right hepatic artery and sub selective arteriogram was performed. Dyna CT imaging was obtained at this location demonstrating supply to the additional hyperenhancing lesion within the dome of the right lobe of the liver. A subsegmental branch of the anterior division of the right hepatic artery supplying a portion of the hyperenhancing hepatic lesion was selected and sub selective injection was performed. This vessel was then embolized with the administration of 70 - 150 micron Lumi LC Beads until vessel stasis was achieved. The microcatheter was retracted and a post embolization arteriogram was performed. The microcatheter was then utilized to on additional branch of the anterior division of the right hepatic artery supplying the remaining portion of the hypervascular hepatic lesion and a sub selective injection was performed. This vessel was then embolized with the administration of 70 - 150 micron Lumi LC Beads until vessel stasis was achieved. The microcatheter was retracted and a post embolization arteriogram was performed. At this point the procedure was terminated. All wires, catheters and sheaths were removed from the patient.  Hemostasis was achieved at the right groin access site with manual compression. A dressing was placed. The patient tolerated the procedure well without immediate postprocedural complication. FINDINGS: Celiac arteriogram demonstrates non-conventional branching pattern of the common hepatic artery with separate origins of the medial and lateral segmental left hepatic arteries. Sub selective injection of a distal branch vessel of the posterior segmental branch of the right hepatic artery confirmed arterial supply to the infiltrative lesion within the posterior aspect of the right lobe of the liver. This finding was confirmed with Dyna CT and the vascular distribution was successfully particle embolized with Lumi LC Beads. Dyna CT imaging of selective injection of the anterior branch of the right hepatic artery confirmed arterial supply by two separate subsegmental vessels, both of which were successfully catheterized and percutaneously particle embolized with Lumi LC Beads. IMPRESSION: Technically successful particle embolization of both dominant hyperenhancing hepatic lesions with Lumi LC Beads. PLAN: - The patient will be observed overnight for continued observation in PCA usage. - The patient will return to the interventional radiology clinic in 3 weeks for postprocedural evaluation and management prior to potential percutaneous microwave ablation. LFTs and an AFP level will be obtained at that time. Electronically Signed   By: Sandi Mariscal M.D.   On: 09/13/2016 18:45   Ir 3d Coralyn Mark  Result Date: 09/13/2016 INDICATION: History of multifocal hepatocellular carcinoma. Patient presents today for pole and embolization of both common hepatic lesions in hopes of improved tumor control prior to potential percutaneous microwave ablation. EXAM: 1. ULTRASOUND GUIDANCE FOR ARTERIAL ACCESS 2. SELECTIVE CELIAC ARTERIOGRAM 3. SELECTIVE PROPER HEPATIC ARTERIOGRAM 4. SELECTIVE SUBSEGMENTAL POSTERIOR HEPATIC ARTERIOGRAM,  DYNA CT AND PERCUTANEOUS PARTICLE EMBOLIZATION 5. SELECTIVE SUBSEGMENTAL ANTERIOR HEPATIC ARTERIOGRAM, DYNA CT AND PERCUTANEOUS PARTICLE EMBOLIZATION X2 6. WORKSTATION 3D RECONSTRUCTION MEDICATIONS: Ancef 2 gm IV. The antibiotic was administered within 1 hour of the procedure ANESTHESIA/SEDATION: Moderate (conscious) sedation was employed during this procedure. A total of Versed 6 mg and Fentanyl 300 mcg was administered intravenously. Moderate Sedation Time: 140 minutes. The patient's level of consciousness and vital signs were  monitored continuously by radiology nursing throughout the procedure under my direct supervision. CONTRAST:  130 cc Isovue 300 FLUOROSCOPY TIME:  Fluoroscopy Time: 22 minutes 30 seconds (2,967 mGy). COMPLICATIONS: None immediate. PROCEDURE: Informed consent was obtained from the patient following explanation of the procedure, risks, benefits and alternatives. The patient understands, agrees and consents for the procedure. All questions were addressed. A time out was performed prior to the initiation of the procedure. Maximal barrier sterile technique utilized including caps, mask, sterile gowns, sterile gloves, large sterile drape, hand hygiene, and Betadine prep. The right femoral head was marked fluoroscopically . Under ultrasound guidance, the right common femoral artery was accessed with a micropuncture kit after the overlying soft tissues were anesthetized with 1% lidocaine. An ultrasound image was saved for documentation purposes. The micropuncture sheath was exchanged for a 5 [place] vascular sheath over a Bentson wire. A closure arteriogram was performed through the side of the sheath confirming access within the right common femoral artery. Over a Bentson wire, a Mickelson catheter was advanced to the level of the thoracic aorta where it was back bled and flushed. The catheter was then utilized to select the celiac artery and a selective celiac arteriogram was performed. With the use  of a Fathom 14 micro wire, a high-flow Renegade microcatheter was advanced into the proper hepatic artery and a common hepatic arteriogram was performed. The microcatheter was then advanced to select a subsegmental branch of the posterior division of the right hepatic artery which was favored to supply via the dominant ill-defined infiltrative mass within the inferior aspect the right lobe of the liver. Appropriate positioning was confirmed with the acquisition of the Dyna CT. From this location, the vessel was embolized with 70 - 150 micron Lumi LC Beads until vessel stasis was achieved. The microcatheter was retracted and a post embolization arteriogram was performed. The microcatheter was then advanced into an additional segmental branch of the posterior division of the right hepatic artery which was thought to supply the additional hyperenhancing lesion within the dome of the right lobe of the liver and a sub selective arteriogram was performed. Dyna CT imaging was obtained at this location, however it became evident the additional hypervascular lesion was not supplied by this vascular distribution. As such, the microcatheter was advanced into the anterior division of the right hepatic artery and sub selective arteriogram was performed. Dyna CT imaging was obtained at this location demonstrating supply to the additional hyperenhancing lesion within the dome of the right lobe of the liver. A subsegmental branch of the anterior division of the right hepatic artery supplying a portion of the hyperenhancing hepatic lesion was selected and sub selective injection was performed. This vessel was then embolized with the administration of 70 - 150 micron Lumi LC Beads until vessel stasis was achieved. The microcatheter was retracted and a post embolization arteriogram was performed. The microcatheter was then utilized to on additional branch of the anterior division of the right hepatic artery supplying the remaining  portion of the hypervascular hepatic lesion and a sub selective injection was performed. This vessel was then embolized with the administration of 70 - 150 micron Lumi LC Beads until vessel stasis was achieved. The microcatheter was retracted and a post embolization arteriogram was performed. At this point the procedure was terminated. All wires, catheters and sheaths were removed from the patient. Hemostasis was achieved at the right groin access site with manual compression. A dressing was placed. The patient tolerated the procedure well without immediate  postprocedural complication. FINDINGS: Celiac arteriogram demonstrates non-conventional branching pattern of the common hepatic artery with separate origins of the medial and lateral segmental left hepatic arteries. Sub selective injection of a distal branch vessel of the posterior segmental branch of the right hepatic artery confirmed arterial supply to the infiltrative lesion within the posterior aspect of the right lobe of the liver. This finding was confirmed with Dyna CT and the vascular distribution was successfully particle embolized with Lumi LC Beads. Dyna CT imaging of selective injection of the anterior branch of the right hepatic artery confirmed arterial supply by two separate subsegmental vessels, both of which were successfully catheterized and percutaneously particle embolized with Lumi LC Beads. IMPRESSION: Technically successful particle embolization of both dominant hyperenhancing hepatic lesions with Lumi LC Beads. PLAN: - The patient will be observed overnight for continued observation in PCA usage. - The patient will return to the interventional radiology clinic in 3 weeks for postprocedural evaluation and management prior to potential percutaneous microwave ablation. LFTs and an AFP level will be obtained at that time. Electronically Signed   By: Sandi Mariscal M.D.   On: 09/13/2016 18:45   Ir US Guide Vasc Access Right  Result Date:  09/13/2016 INDICATION: History of multifocal hepatocellular carcinoma. Patient presents today for pole and embolization of both common hepatic lesions in hopes of improved tumor control prior to potential percutaneous microwave ablation. EXAM: 1. ULTRASOUND GUIDANCE FOR ARTERIAL ACCESS 2. SELECTIVE CELIAC ARTERIOGRAM 3. SELECTIVE PROPER HEPATIC ARTERIOGRAM 4. SELECTIVE SUBSEGMENTAL POSTERIOR HEPATIC ARTERIOGRAM, DYNA CT AND PERCUTANEOUS PARTICLE EMBOLIZATION 5. SELECTIVE SUBSEGMENTAL ANTERIOR HEPATIC ARTERIOGRAM, DYNA CT AND PERCUTANEOUS PARTICLE EMBOLIZATION X2 6. WORKSTATION 3D RECONSTRUCTION MEDICATIONS: Ancef 2 gm IV. The antibiotic was administered within 1 hour of the procedure ANESTHESIA/SEDATION: Moderate (conscious) sedation was employed during this procedure. A total of Versed 6 mg and Fentanyl 300 mcg was administered intravenously. Moderate Sedation Time: 140 minutes. The patient's level of consciousness and vital signs were monitored continuously by radiology nursing throughout the procedure under my direct supervision. CONTRAST:  130 cc Isovue 300 FLUOROSCOPY TIME:  Fluoroscopy Time: 22 minutes 30 seconds (2,967 mGy). COMPLICATIONS: None immediate. PROCEDURE: Informed consent was obtained from the patient following explanation of the procedure, risks, benefits and alternatives. The patient understands, agrees and consents for the procedure. All questions were addressed. A time out was performed prior to the initiation of the procedure. Maximal barrier sterile technique utilized including caps, mask, sterile gowns, sterile gloves, large sterile drape, hand hygiene, and Betadine prep. The right femoral head was marked fluoroscopically . Under ultrasound guidance, the right common femoral artery was accessed with a micropuncture kit after the overlying soft tissues were anesthetized with 1% lidocaine. An ultrasound image was saved for documentation purposes. The micropuncture sheath was exchanged for a 5  [place] vascular sheath over a Bentson wire. A closure arteriogram was performed through the side of the sheath confirming access within the right common femoral artery. Over a Bentson wire, a Mickelson catheter was advanced to the level of the thoracic aorta where it was back bled and flushed. The catheter was then utilized to select the celiac artery and a selective celiac arteriogram was performed. With the use of a Fathom 14 micro wire, a high-flow Renegade microcatheter was advanced into the proper hepatic artery and a common hepatic arteriogram was performed. The microcatheter was then advanced to select a subsegmental branch of the posterior division of the right hepatic artery which was favored to supply via the dominant ill-defined infiltrative  mass within the inferior aspect the right lobe of the liver. Appropriate positioning was confirmed with the acquisition of the Dyna CT. From this location, the vessel was embolized with 70 - 150 micron Lumi LC Beads until vessel stasis was achieved. The microcatheter was retracted and a post embolization arteriogram was performed. The microcatheter was then advanced into an additional segmental branch of the posterior division of the right hepatic artery which was thought to supply the additional hyperenhancing lesion within the dome of the right lobe of the liver and a sub selective arteriogram was performed. Dyna CT imaging was obtained at this location, however it became evident the additional hypervascular lesion was not supplied by this vascular distribution. As such, the microcatheter was advanced into the anterior division of the right hepatic artery and sub selective arteriogram was performed. Dyna CT imaging was obtained at this location demonstrating supply to the additional hyperenhancing lesion within the dome of the right lobe of the liver. A subsegmental branch of the anterior division of the right hepatic artery supplying a portion of the hyperenhancing  hepatic lesion was selected and sub selective injection was performed. This vessel was then embolized with the administration of 70 - 150 micron Lumi LC Beads until vessel stasis was achieved. The microcatheter was retracted and a post embolization arteriogram was performed. The microcatheter was then utilized to on additional branch of the anterior division of the right hepatic artery supplying the remaining portion of the hypervascular hepatic lesion and a sub selective injection was performed. This vessel was then embolized with the administration of 70 - 150 micron Lumi LC Beads until vessel stasis was achieved. The microcatheter was retracted and a post embolization arteriogram was performed. At this point the procedure was terminated. All wires, catheters and sheaths were removed from the patient. Hemostasis was achieved at the right groin access site with manual compression. A dressing was placed. The patient tolerated the procedure well without immediate postprocedural complication. FINDINGS: Celiac arteriogram demonstrates non-conventional branching pattern of the common hepatic artery with separate origins of the medial and lateral segmental left hepatic arteries. Sub selective injection of a distal branch vessel of the posterior segmental branch of the right hepatic artery confirmed arterial supply to the infiltrative lesion within the posterior aspect of the right lobe of the liver. This finding was confirmed with Dyna CT and the vascular distribution was successfully particle embolized with Lumi LC Beads. Dyna CT imaging of selective injection of the anterior branch of the right hepatic artery confirmed arterial supply by two separate subsegmental vessels, both of which were successfully catheterized and percutaneously particle embolized with Lumi LC Beads. IMPRESSION: Technically successful particle embolization of both dominant hyperenhancing hepatic lesions with Lumi LC Beads. PLAN: - The patient  will be observed overnight for continued observation in PCA usage. - The patient will return to the interventional radiology clinic in 3 weeks for postprocedural evaluation and management prior to potential percutaneous microwave ablation. LFTs and an AFP level will be obtained at that time. Electronically Signed   By: Sandi Mariscal M.D.   On: 09/13/2016 18:45   Ir Embo Tumor Organ Ischemia Infarct Inc Guide Roadmapping  Result Date: 09/13/2016 INDICATION: History of multifocal hepatocellular carcinoma. Patient presents today for pole and embolization of both common hepatic lesions in hopes of improved tumor control prior to potential percutaneous microwave ablation. EXAM: 1. ULTRASOUND GUIDANCE FOR ARTERIAL ACCESS 2. SELECTIVE CELIAC ARTERIOGRAM 3. SELECTIVE PROPER HEPATIC ARTERIOGRAM 4. SELECTIVE SUBSEGMENTAL POSTERIOR HEPATIC ARTERIOGRAM, DYNA  CT AND PERCUTANEOUS PARTICLE EMBOLIZATION 5. SELECTIVE SUBSEGMENTAL ANTERIOR HEPATIC ARTERIOGRAM, DYNA CT AND PERCUTANEOUS PARTICLE EMBOLIZATION X2 6. WORKSTATION 3D RECONSTRUCTION MEDICATIONS: Ancef 2 gm IV. The antibiotic was administered within 1 hour of the procedure ANESTHESIA/SEDATION: Moderate (conscious) sedation was employed during this procedure. A total of Versed 6 mg and Fentanyl 300 mcg was administered intravenously. Moderate Sedation Time: 140 minutes. The patient's level of consciousness and vital signs were monitored continuously by radiology nursing throughout the procedure under my direct supervision. CONTRAST:  130 cc Isovue 300 FLUOROSCOPY TIME:  Fluoroscopy Time: 22 minutes 30 seconds (2,967 mGy). COMPLICATIONS: None immediate. PROCEDURE: Informed consent was obtained from the patient following explanation of the procedure, risks, benefits and alternatives. The patient understands, agrees and consents for the procedure. All questions were addressed. A time out was performed prior to the initiation of the procedure. Maximal barrier sterile technique  utilized including caps, mask, sterile gowns, sterile gloves, large sterile drape, hand hygiene, and Betadine prep. The right femoral head was marked fluoroscopically . Under ultrasound guidance, the right common femoral artery was accessed with a micropuncture kit after the overlying soft tissues were anesthetized with 1% lidocaine. An ultrasound image was saved for documentation purposes. The micropuncture sheath was exchanged for a 5 [place] vascular sheath over a Bentson wire. A closure arteriogram was performed through the side of the sheath confirming access within the right common femoral artery. Over a Bentson wire, a Mickelson catheter was advanced to the level of the thoracic aorta where it was back bled and flushed. The catheter was then utilized to select the celiac artery and a selective celiac arteriogram was performed. With the use of a Fathom 14 micro wire, a high-flow Renegade microcatheter was advanced into the proper hepatic artery and a common hepatic arteriogram was performed. The microcatheter was then advanced to select a subsegmental branch of the posterior division of the right hepatic artery which was favored to supply via the dominant ill-defined infiltrative mass within the inferior aspect the right lobe of the liver. Appropriate positioning was confirmed with the acquisition of the Dyna CT. From this location, the vessel was embolized with 70 - 150 micron Lumi LC Beads until vessel stasis was achieved. The microcatheter was retracted and a post embolization arteriogram was performed. The microcatheter was then advanced into an additional segmental branch of the posterior division of the right hepatic artery which was thought to supply the additional hyperenhancing lesion within the dome of the right lobe of the liver and a sub selective arteriogram was performed. Dyna CT imaging was obtained at this location, however it became evident the additional hypervascular lesion was not supplied  by this vascular distribution. As such, the microcatheter was advanced into the anterior division of the right hepatic artery and sub selective arteriogram was performed. Dyna CT imaging was obtained at this location demonstrating supply to the additional hyperenhancing lesion within the dome of the right lobe of the liver. A subsegmental branch of the anterior division of the right hepatic artery supplying a portion of the hyperenhancing hepatic lesion was selected and sub selective injection was performed. This vessel was then embolized with the administration of 70 - 150 micron Lumi LC Beads until vessel stasis was achieved. The microcatheter was retracted and a post embolization arteriogram was performed. The microcatheter was then utilized to on additional branch of the anterior division of the right hepatic artery supplying the remaining portion of the hypervascular hepatic lesion and a sub selective injection was performed.  This vessel was then embolized with the administration of 70 - 150 micron Lumi LC Beads until vessel stasis was achieved. The microcatheter was retracted and a post embolization arteriogram was performed. At this point the procedure was terminated. All wires, catheters and sheaths were removed from the patient. Hemostasis was achieved at the right groin access site with manual compression. A dressing was placed. The patient tolerated the procedure well without immediate postprocedural complication. FINDINGS: Celiac arteriogram demonstrates non-conventional branching pattern of the common hepatic artery with separate origins of the medial and lateral segmental left hepatic arteries. Sub selective injection of a distal branch vessel of the posterior segmental branch of the right hepatic artery confirmed arterial supply to the infiltrative lesion within the posterior aspect of the right lobe of the liver. This finding was confirmed with Dyna CT and the vascular distribution was successfully  particle embolized with Lumi LC Beads. Dyna CT imaging of selective injection of the anterior branch of the right hepatic artery confirmed arterial supply by two separate subsegmental vessels, both of which were successfully catheterized and percutaneously particle embolized with Lumi LC Beads. IMPRESSION: Technically successful particle embolization of both dominant hyperenhancing hepatic lesions with Lumi LC Beads. PLAN: - The patient will be observed overnight for continued observation in PCA usage. - The patient will return to the interventional radiology clinic in 3 weeks for postprocedural evaluation and management prior to potential percutaneous microwave ablation. LFTs and an AFP level will be obtained at that time. Electronically Signed   By: Sandi Mariscal M.D.   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 He presents for arterial mapping and lesions size evaluation prior to definitive liver directed therapy. EXAM: CTA ABDOMEN AND PELVIS WITHOUT AND WITH CONTRAST TECHNIQUE: Multidetector CT imaging of the abdomen and pelvis was performed using the standard protocol during bolus administration of intravenous contrast. Multiplanar reconstructed images and MIPs were obtained and reviewed to evaluate the vascular anatomy. CONTRAST:  1100m ISOVUE-300 IOPAMIDOL (ISOVUE-300) INJECTION 61% COMPARISON:  MRI abdomen 07/16/2016; abdominal ultrasound 07/12/2016 FINDINGS: VASCULAR Aorta: Heterogeneous atherosclerotic plaque which is primarily calcified throughout the abdominal aorta. No evidence of aneurysm or dissection. Celiac: Mild calcified atherosclerotic plaque at the origin without significant stenosis. Conventional hepatic arterial anatomy. No evidence of accessory or replaced right hepatic artery. The right gastric artery is not definitively identified but may arise from  the gastroduodenal artery. SMA: Complex calcified and fibrofatty atherosclerotic plaque beginning approximately 1 cm beyond the vessel origin results in a moderate stenosis measuring approximately 2 cm in length. Renals: Single dominant renal arteries bilaterally. Heavy calcified atherosclerotic plaque at the origin of the right renal artery results in a moderate to high-grade focal stenosis. On the left, bulky calcified atherosclerotic plaque results in moderate stenosis. IMA: Patent. Inflow: Heavily calcified atherosclerotic plaque in the bilateral common iliac arteries without significant focal stenosis. There is likely a mild focal stenosis on the left. Both internal iliac arteries are patent. The external iliac arteries are tortuous but relatively spared from disease. Proximal Outflow: Moderate bulky calcified atherosclerotic plaque along the posterior wall of the right common femoral artery results in mild stenosis. On the left, there is markedly bulky calcified atherosclerotic plaque resulting in a significant stenosis of the common femoral artery. The visualized profunda and superficial femoral arteries are widely patent on the right. However, on the left the bulky calcified  plaque in the common femoral artery extends into both the superficial and profunda femoral arteries resulting in a critical stenosis of the origin of the superficial femoral artery and a moderate stenosis of the origin of the profunda femoral artery. Veins: No focal venous abnormality. Specifically, the main and intrahepatic portal veins are widely patent. Review of the MIP images confirms the above findings. NON-VASCULAR Lower chest: Respiratory motion limits evaluation small pulmonary nodules. However, within these limitations, no suspicious pulmonary nodule or mass is identified. The visualized cardiac structures are within normal limits for size. No pericardial effusion. Unremarkable visualized distal thoracic esophagus.  Hepatobiliary: Morphologically cirrhotic liver with marked hypertrophy of the caudate lobe and a diffusely nodular surface contour. There is an avidly enhancing round 2.1 x 2.1 x 2.4 cm lesion superiorly within hepatic segment 8 which demonstrates delayed washout consistent with the previously noted hepatocellular carcinoma. A second avidly enhancing lesion with a more irregular and amorphous shape is present within hepatic segment 6. By my measurements, this lesion measures approximately 3.2 x 2.2 x 3.4 cm. Delayed washout is more subtle and difficult to confirm by CT imaging. A third a small (0.8 cm) avidly enhancing lesion is noted in the posterior tip of hepatic segment 6 (axial image 52, series 6; sagittal image 26 series 10; coronal image 54 series 9) is also concerning. There is no intrahepatic biliary ductal dilatation. Multiple stones layer within the gallbladder neck. Pancreas: Unremarkable. No pancreatic ductal dilatation or surrounding inflammatory changes. Spleen: Normal in size without focal abnormality. Adrenals/Urinary Tract: Normal adrenal glands. The kidneys are normal in size without evidence of hydronephrosis, nephrolithiasis or enhancing renal mass. No significant renal cortical atrophy despite suspicion for high-grade stenosis of the right renal artery. Normal bladder. Stomach/Bowel: Colonic diverticular disease without CT evidence of active inflammation. No focal bowel wall thickening or evidence of obstruction. Lymphatic: No suspicious lymphadenopathy. Reproductive: Dystrophic calcifications centrally within the prostate gland. The gland remains normal in size. No enhancing mass. Other: No abdominal wall hernia or abnormality. No abdominopelvic ascites. Musculoskeletal: No acute fracture or aggressive appearing lytic or blastic osseous lesion. IMPRESSION: VASCULAR 1. Conventional hepatic arterial anatomy. 2. Extensive atherosclerotic vascular disease including a calcified aortic  atherosclerosis. Aortic Atherosclerosis (ICD10-I70.0) 3. 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 4. Moderate to high-grade focal stenosis at the origin of the right renal artery. 5. Mild to moderate focal stenosis at the origin of the left renal artery. 6. Mild focal stenosis in the left common iliac artery. NON-VASCULAR 1. Three avidly enhancing lesions within the liver as described above. The 2 larger lesions demonstrate delayed washout consistent with hepatocellular carcinoma as noted on the prior MRI. Of note, the segment 6 lesion is irregular in shape and measures at least 3.4 cm in maximal dimension. The third and smallest lesion measures less than 1 cm and is too small for accurate characterization but warrants close attention on follow-up imaging as this could represent an additional focus of [place]. 2. Hepatic cirrhosis without evidence of portal vein thrombosis or splenomegaly. 3. Colonic diverticular disease without CT evidence of active inflammation. 4. Additional ancillary findings as above. Signed, Criselda Peaches, MD [facility] Radiology Electronically Signed   By: Jacqulynn Cadet M.D.   On: 08/28/2016 11:41    Labs:  CBC:  Recent Labs  06/28/16 1012 09/13/16 0942  WBC 8.3 10.7*  HGB 14.3 14.3  HCT 42.0 40.8  PLT 237.0  [place]:  Recent Labs  08/03/16 1221 09/13/16 0942  INR 1.0 1.07  APTT  --  29    BMP:  Recent Labs  06/28/16 1012 08/28/16 0846 09/13/16 0942  NA 137  --  140  K 4.3  --  3.5  CL 104  --  105  CO2 29  --  25  GLUCOSE 109*  --  136*  BUN 14  --  9  CALCIUM 8.9  --  9.1  CREATININE 0.56 0.60* 0.62  GFRNONAA  --   --  >60  GFRAA  --   --  >60    LIVER FUNCTION TESTS:  Recent Labs  06/28/16 1012 09/13/16 0942 09/24/16 0959  BILITOT 0.7 1.0 0.4  AST 77* 75* 82*  ALT 41 39 45  ALKPHOS 174*  140* 204*  PROT 7.3 8.0 7.3  ALBUMIN 3.8 3.9 3.7    TUMOR MARKERS:  Recent Labs  06/28/16 1012 09/24/16 0959  AFPTM 5.3 5.2    Assessment and Plan:  Joshua [name] is a 61 y.o. male with past history significant for hypertension, smoking, hepatitis C and alcoholic cirrhosis and multifocal hepatocellular carcinoma with associated mildly elevated AFP who underwent technically successful Dyna CT augmented percutaneous bland embolization of both dominant hepatic lesions.   Patient has recovered completely from the bland embolization and is without complaint. His right groin access site is well-healed without hematoma.  Selected images from the bland hepatic embolization performed 09/13/2016 were reviewed in detail with the patient and the patient's family.  Post embolization AFP level is stable at 5.2 (obtained on 06/28/2016, previously, 5.2 (obtained on 06/28/2016) and his LFTs are unchanged.  As such, we will proceed with the planned microwave liver ablation of both embolized liver lesions.    Risks and Benefits of microwave ablation were discussed with the patient and the patient's family including, but not limited to failure to treat entire lesion, bleeding, infection, damage to adjacent structures, decrease in hepaticl function or post procedural neuropathy.  All of the patient's questions were answered and the patient is agreeable to proceed.  As such, the procedure will be performed as scheduled for next Friday, [phone], at [facility]. The procedure will again entail an overnight admission for continued observation and PCA usage.  The patient was encouraged to call the interventional radiology clinic with any interval questions or concerns.  A copy of this report was sent to the requesting provider on this date.  Electronically Signed: Sandi Mariscal 09/26/2016, 11:34 AM   I spent a total of 15 Minutes in face to face in clinical consultation, greater than 50% of which  was counseling/coordinating care for post hepatic bland embolization

## 2016-09-27 ENCOUNTER — Other Ambulatory Visit: Payer: Self-pay | Admitting: Radiology

## 2016-10-01 NOTE — Patient Instructions (Addendum)
Joshua [name]  10/01/2016   Your procedure is scheduled on: 10-05-16  Report to [facility] Main Entrance. Report to Interventional Radiology on the 1st Floor at 06:30 AM   Call this number if you have problems the morning of surgery [phone]    Remember: ONLY 1 PERSON MAY GO WITH YOU TO SHORT STAY TO GET  READY MORNING OF [place].  Do not eat food or drink liquids :After Midnight.     Take these medicines the morning of surgery with A SIP OF WATER: Sertraline (Zoloft)                                You may not have any metal on your body including hair pins and              piercings  Do not wear jewelry, make-up, lotions, powders or perfumes, deodorant             Do not wear nail polish.  Do not shave  48 hours prior to surgery.     Do not bring valuables to the hospital. [place].  Contacts, dentures or bridgework may not be worn into surgery.  Leave suitcase in the car. After surgery it may be brought to your room.                  Please read over the following fact sheets you were given: _____________________________________________________________________             [facility] - Preparing for Surgery Before surgery, you can play an important role.  Because skin is not sterile, your skin needs to be as free of germs as possible.  You can reduce the number of germs on your skin by washing with CHG (chlorahexidine gluconate) soap before surgery.  CHG is an antiseptic cleaner which kills germs and bonds with the skin to continue killing germs even after washing. Please DO NOT use if you have an allergy to CHG or antibacterial soaps.  If your skin becomes reddened/irritated stop using the CHG and inform your nurse when you arrive at Short Stay. Do not shave (including legs and underarms) for at least 48 hours prior to the first CHG shower.  You may shave your face/neck. Please follow these  instructions carefully:  1.  Shower with CHG Soap the night before surgery and the  morning of Surgery.  2.  If you choose to wash your hair, wash your hair first as usual with your  normal  shampoo.  3.  After you shampoo, rinse your hair and body thoroughly to remove the  shampoo.                           4.  Use CHG as you would any other liquid soap.  You can apply chg directly  to the skin and wash                       Gently with a scrungie or clean washcloth.  5.  Apply the CHG Soap to your body ONLY FROM THE NECK DOWN.   Do not use on face/ open  Wound or open sores. Avoid contact with eyes, ears mouth and genitals (private parts).                       Wash face,  Genitals (private parts) with your normal soap.             6.  Wash thoroughly, paying special attention to the area where your surgery  will be performed.  7.  Thoroughly rinse your body with warm water from the neck down.  8.  DO NOT shower/wash with your normal soap after using and rinsing off  the CHG Soap.                9.  Pat yourself dry with a clean towel.            10.  Wear clean pajamas.            11.  Place clean sheets on your bed the night of your first shower and do not  sleep with pets. Day of Surgery : Do not apply any lotions/deodorants the morning of surgery.  Please wear clean clothes to the hospital/surgery center.  FAILURE TO FOLLOW THESE INSTRUCTIONS MAY RESULT IN THE CANCELLATION OF YOUR SURGERY PATIENT SIGNATURE_________________________________  NURSE SIGNATURE__________________________________  ________________________________________________________________________

## 2016-10-04 ENCOUNTER — Encounter (HOSPITAL_COMMUNITY): Payer: Self-pay

## 2016-10-04 ENCOUNTER — Other Ambulatory Visit: Payer: Self-pay | Admitting: Student

## 2016-10-04 ENCOUNTER — Ambulatory Visit (HOSPITAL_COMMUNITY)
Admission: RE | Admit: 2016-10-04 | Discharge: 2016-10-04 | Disposition: A | Discharge status: Home / Self Care | Payer: BLUE CROSS/BLUE SHIELD | Source: Ambulatory Visit | Attending: Radiology | Admitting: Radiology

## 2016-10-04 ENCOUNTER — Encounter (HOSPITAL_COMMUNITY)
Admission: RE | Admit: 2016-10-04 | Discharge: 2016-10-04 | Disposition: A | Discharge status: Home / Self Care | Payer: BLUE CROSS/BLUE SHIELD | Source: Ambulatory Visit | Attending: Interventional Radiology | Admitting: Interventional Radiology

## 2016-10-04 ENCOUNTER — Other Ambulatory Visit: Payer: Self-pay | Admitting: Radiology

## 2016-10-04 DIAGNOSIS — Z01812 Encounter for preprocedural laboratory examination: Secondary | ICD-10-CM | POA: Insufficient documentation

## 2016-10-04 DIAGNOSIS — Z01818 Encounter for other preprocedural examination: Secondary | ICD-10-CM | POA: Insufficient documentation

## 2016-10-04 DIAGNOSIS — I10 Essential (primary) hypertension: Secondary | ICD-10-CM | POA: Diagnosis not present

## 2016-10-04 LAB — CBC WITH DIFFERENTIAL/PLATELET
BASOS ABS: 0 10*3/uL (ref 0.0–0.1)
BASOS PCT: 0 %
EOS ABS: 0.1 10*3/uL (ref 0.0–0.7)
Eosinophils Relative: 1 %
HCT: 41 % (ref 39.0–52.0)
Hemoglobin: 14.4 g/dL (ref 13.0–17.0)
LYMPHS ABS: 2.8 10*3/uL (ref 0.7–4.0)
LYMPHS PCT: 25 %
MCH: 34.2 pg — AB (ref 26.0–34.0)
MCHC: 35.1 g/dL (ref 30.0–36.0)
MCV: 97.4 fL (ref 78.0–100.0)
MONO ABS: 0.5 10*3/uL (ref 0.1–1.0)
Monocytes Relative: 4 %
NEUTROS ABS: 7.9 10*3/uL — AB (ref 1.7–7.7)
Neutrophils Relative %: 70 %
PLATELETS: 254 10*3/uL (ref 150–400)
RBC: 4.21 MIL/uL — ABNORMAL LOW (ref 4.22–5.81)
RDW: 13.7 % (ref 11.5–15.5)
WBC: 11.3 10*3/uL — ABNORMAL HIGH (ref 4.0–10.5)

## 2016-10-04 LAB — COMPREHENSIVE METABOLIC PANEL
ALK PHOS: 155 U/L — AB (ref 38–126)
ALT: 36 U/L (ref 17–63)
ANION GAP: 11 (ref 5–15)
AST: 73 U/L — ABNORMAL HIGH (ref 15–41)
Albumin: 3.6 g/dL (ref 3.5–5.0)
BUN: 9 mg/dL (ref 6–20)
CALCIUM: 9.2 mg/dL (ref 8.9–10.3)
CO2: 29 mmol/L (ref 22–32)
Chloride: 97 mmol/L — ABNORMAL LOW (ref 101–111)
Creatinine, Ser: 0.57 mg/dL — ABNORMAL LOW (ref 0.61–1.24)
GFR calc Af Amer: >60 mL/min (ref >60)
GFR calc non Af Amer: >60 mL/min (ref >60)
GLUCOSE: 114 mg/dL — AB (ref 65–99)
POTASSIUM: 4 mmol/L (ref 3.5–5.1)
SODIUM: 137 mmol/L (ref 135–145)
Total Bilirubin: 0.9 mg/dL (ref 0.3–1.2)
Total Protein: 7.7 g/dL (ref 6.5–8.1)

## 2016-10-04 LAB — PROTIME-INR
INR: 1.05
Prothrombin Time: 13.7 seconds (ref 11.4–15.2)

## 2016-10-04 LAB — ABO/RH: ABO/RH(D): O POS

## 2016-10-04 NOTE — Progress Notes (Signed)
Spoke with Dr Sabra Heck ( anesthesia) and made aware of anesthesia consult per Dr Pascal Lux ( Radiology ) Reviewed with anesthesia medical history.  Dr Sabra Heck stated patient would be seen am of procedure.

## 2016-10-04 NOTE — Progress Notes (Signed)
Final EKG in epic done 10/04/16.

## 2016-10-05 ENCOUNTER — Ambulatory Visit (HOSPITAL_COMMUNITY)
Admission: RE | Admit: 2016-10-05 | Discharge: 2016-10-05 | Disposition: A | Discharge status: Home / Self Care | Payer: BLUE CROSS/BLUE SHIELD | Source: Ambulatory Visit | Attending: Interventional Radiology | Admitting: Interventional Radiology

## 2016-10-05 ENCOUNTER — Observation Stay (HOSPITAL_COMMUNITY)
Admission: RE | Admit: 2016-10-05 | Discharge: 2016-10-06 | Disposition: A | Discharge status: Home / Self Care | Payer: BLUE CROSS/BLUE SHIELD | Source: Ambulatory Visit | Attending: Interventional Radiology | Admitting: Interventional Radiology

## 2016-10-05 ENCOUNTER — Encounter (HOSPITAL_COMMUNITY): Payer: Self-pay | Admitting: Certified Registered"

## 2016-10-05 ENCOUNTER — Encounter (HOSPITAL_COMMUNITY): Payer: Self-pay

## 2016-10-05 ENCOUNTER — Ambulatory Visit (HOSPITAL_COMMUNITY): Payer: BLUE CROSS/BLUE SHIELD

## 2016-10-05 ENCOUNTER — Encounter (HOSPITAL_COMMUNITY)
Admission: RE | Disposition: A | Discharge status: Home / Self Care | Payer: Self-pay | Source: Ambulatory Visit | Attending: Interventional Radiology

## 2016-10-05 DIAGNOSIS — K7030 Alcoholic cirrhosis of liver without ascites: Secondary | ICD-10-CM

## 2016-10-05 DIAGNOSIS — F17210 Nicotine dependence, cigarettes, uncomplicated: Secondary | ICD-10-CM | POA: Insufficient documentation

## 2016-10-05 DIAGNOSIS — C220 Liver cell carcinoma: Secondary | ICD-10-CM

## 2016-10-05 DIAGNOSIS — F419 Anxiety disorder, unspecified: Secondary | ICD-10-CM | POA: Diagnosis not present

## 2016-10-05 DIAGNOSIS — F329 Major depressive disorder, single episode, unspecified: Secondary | ICD-10-CM | POA: Diagnosis not present

## 2016-10-05 DIAGNOSIS — B1920 Unspecified viral hepatitis C without hepatic coma: Secondary | ICD-10-CM | POA: Diagnosis not present

## 2016-10-05 DIAGNOSIS — K219 Gastro-esophageal reflux disease without esophagitis: Secondary | ICD-10-CM | POA: Insufficient documentation

## 2016-10-05 DIAGNOSIS — I10 Essential (primary) hypertension: Secondary | ICD-10-CM | POA: Diagnosis not present

## 2016-10-05 DIAGNOSIS — R7302 Impaired glucose tolerance (oral): Secondary | ICD-10-CM | POA: Insufficient documentation

## 2016-10-05 HISTORY — PX: RADIOFREQUENCY ABLATION: SHX2290

## 2016-10-05 LAB — TYPE AND SCREEN
ABO/RH(D): O POS
Antibody Screen: NEGATIVE

## 2016-10-05 SURGERY — RADIO FREQUENCY ABLATION
Anesthesia: General

## 2016-10-05 MED ORDER — ONDANSETRON HCL 4 MG/2ML IJ SOLN
INTRAMUSCULAR | Status: DC | PRN
  Administered 2016-10-05: 4 mg via INTRAVENOUS

## 2016-10-05 MED ORDER — EPHEDRINE SULFATE-NACL 50-0.9 MG/10ML-% IV SOSY
PREFILLED_SYRINGE | INTRAVENOUS | Status: DC | PRN
  Administered 2016-10-05: 5 mg via INTRAVENOUS

## 2016-10-05 MED ORDER — B COMPLEX VITAMINS PO CAPS
1.0000 | ORAL_CAPSULE | Freq: Every day | ORAL | Status: DC
  Filled 2016-10-05 (×3): qty 1

## 2016-10-05 MED ORDER — NALOXONE HCL 0.4 MG/ML IJ SOLN: 0.4000 mg | INTRAMUSCULAR | Status: DC | PRN

## 2016-10-05 MED ORDER — FENTANYL CITRATE (PF) 100 MCG/2ML IJ SOLN
INTRAMUSCULAR | Status: DC | PRN
  Administered 2016-10-05 (×5): 50 ug via INTRAVENOUS

## 2016-10-05 MED ORDER — DIPHENHYDRAMINE HCL 50 MG/ML IJ SOLN
12.5000 mg | Freq: Four times a day (QID) | INTRAMUSCULAR | Status: DC | PRN
Start: 2016-10-05 — End: 2016-10-06

## 2016-10-05 MED ORDER — SUGAMMADEX SODIUM 200 MG/2ML IV SOLN
INTRAVENOUS | Status: DC | PRN
  Administered 2016-10-05: 200 mg via INTRAVENOUS

## 2016-10-05 MED ORDER — LISINOPRIL-HYDROCHLOROTHIAZIDE 10-12.5 MG PO TABS: 1.0000 | ORAL_TABLET | Freq: Every day | ORAL | Status: DC

## 2016-10-05 MED ORDER — LISINOPRIL 10 MG PO TABS
10.0000 mg | ORAL_TABLET | Freq: Every day | ORAL | Status: DC
  Administered 2016-10-05 – 2016-10-06 (×2): 10 mg via ORAL
  Filled 2016-10-05 (×2): qty 1

## 2016-10-05 MED ORDER — ROCURONIUM BROMIDE 10 MG/ML (PF) SYRINGE
PREFILLED_SYRINGE | INTRAVENOUS | Status: DC | PRN
  Administered 2016-10-05: 10 mg via INTRAVENOUS
  Administered 2016-10-05: 5 mg via INTRAVENOUS
  Administered 2016-10-05 (×5): 10 mg via INTRAVENOUS
  Administered 2016-10-05: 50 mg via INTRAVENOUS

## 2016-10-05 MED ORDER — HYDROCHLOROTHIAZIDE 12.5 MG PO CAPS
12.5000 mg | ORAL_CAPSULE | Freq: Every day | ORAL | Status: DC
  Administered 2016-10-05 – 2016-10-06 (×2): 12.5 mg via ORAL
  Filled 2016-10-05 (×2): qty 1

## 2016-10-05 MED ORDER — IOPAMIDOL (ISOVUE-370) INJECTION 76%
INTRAVENOUS | Status: AC
  Filled 2016-10-05: qty 100

## 2016-10-05 MED ORDER — SODIUM CHLORIDE 0.9% FLUSH: 9.0000 mL | INTRAVENOUS | Status: DC | PRN

## 2016-10-05 MED ORDER — MIDAZOLAM HCL 2 MG/2ML IJ SOLN
INTRAMUSCULAR | Status: AC
  Filled 2016-10-05: qty 2

## 2016-10-05 MED ORDER — DEXAMETHASONE SODIUM PHOSPHATE 10 MG/ML IJ SOLN
INTRAMUSCULAR | Status: DC | PRN
  Administered 2016-10-05: 10 mg via INTRAVENOUS

## 2016-10-05 MED ORDER — SERTRALINE HCL 100 MG PO TABS
100.0000 mg | ORAL_TABLET | Freq: Every day | ORAL | Status: DC
  Administered 2016-10-05 – 2016-10-06 (×2): 100 mg via ORAL
  Filled 2016-10-05 (×2): qty 1

## 2016-10-05 MED ORDER — SODIUM CHLORIDE 0.9 % IV SOLN
INTRAVENOUS | Status: AC
  Administered 2016-10-05: 1000 mL
  Filled 2016-10-05: qty 1000

## 2016-10-05 MED ORDER — MIDAZOLAM HCL 5 MG/5ML IJ SOLN
INTRAMUSCULAR | Status: DC | PRN
  Administered 2016-10-05: 2 mg via INTRAVENOUS

## 2016-10-05 MED ORDER — FENTANYL CITRATE (PF) 100 MCG/2ML IJ SOLN: 25.0000 ug | INTRAMUSCULAR | Status: DC | PRN

## 2016-10-05 MED ORDER — ADULT MULTIVITAMIN W/MINERALS CH
1.0000 | ORAL_TABLET | Freq: Every day | ORAL | Status: DC
  Administered 2016-10-06: 1 via ORAL
  Filled 2016-10-05: qty 1

## 2016-10-05 MED ORDER — B COMPLEX-C PO TABS
1.0000 | ORAL_TABLET | Freq: Every day | ORAL | Status: DC
  Administered 2016-10-05: 1 via ORAL
  Filled 2016-10-05: qty 1

## 2016-10-05 MED ORDER — LACTATED RINGERS IV SOLN
INTRAVENOUS | Status: DC
  Administered 2016-10-05 (×3): via INTRAVENOUS

## 2016-10-05 MED ORDER — DIPHENHYDRAMINE HCL 12.5 MG/5ML PO ELIX
12.5000 mg | ORAL_SOLUTION | Freq: Four times a day (QID) | ORAL | Status: DC | PRN
  Filled 2016-10-05: qty 5

## 2016-10-05 MED ORDER — PROPOFOL 10 MG/ML IV BOLUS
INTRAVENOUS | Status: DC | PRN
  Administered 2016-10-05: 200 mg via INTRAVENOUS

## 2016-10-05 MED ORDER — HYDROMORPHONE 1 MG/ML IV SOLN
INTRAVENOUS | Status: DC
  Administered 2016-10-05: 25 mg via INTRAVENOUS
  Administered 2016-10-05: 1.2 mg via INTRAVENOUS
  Administered 2016-10-05 – 2016-10-06 (×2): 1.5 mg via INTRAVENOUS
  Administered 2016-10-06: 0.9 mg via INTRAVENOUS
  Administered 2016-10-06: 9 mg via INTRAVENOUS
  Administered 2016-10-06: 0.9 mg via INTRAVENOUS
  Filled 2016-10-05: qty 25

## 2016-10-05 MED ORDER — PHENYLEPHRINE 40 MCG/ML (10ML) SYRINGE FOR IV PUSH (FOR BLOOD PRESSURE SUPPORT)
PREFILLED_SYRINGE | INTRAVENOUS | Status: DC | PRN
  Administered 2016-10-05: 80 ug via INTRAVENOUS
  Administered 2016-10-05: 120 ug via INTRAVENOUS
  Administered 2016-10-05 (×7): 80 ug via INTRAVENOUS
  Administered 2016-10-05: 120 ug via INTRAVENOUS

## 2016-10-05 MED ORDER — FENTANYL CITRATE (PF) 250 MCG/5ML IJ SOLN
INTRAMUSCULAR | Status: AC
  Filled 2016-10-05: qty 5

## 2016-10-05 MED ORDER — LIDOCAINE 2% (20 MG/ML) 5 ML SYRINGE
INTRAMUSCULAR | Status: DC | PRN
  Administered 2016-10-05: 100 mg via INTRAVENOUS

## 2016-10-05 MED ORDER — ONDANSETRON HCL 4 MG/2ML IJ SOLN: 4.0000 mg | Freq: Four times a day (QID) | INTRAMUSCULAR | Status: DC | PRN

## 2016-10-05 MED ORDER — IOPAMIDOL (ISOVUE-370) INJECTION 76%
INTRAVENOUS | Status: AC
  Administered 2016-10-05: 50 mL
  Filled 2016-10-05: qty 100

## 2016-10-05 MED ORDER — DOCUSATE SODIUM 100 MG PO CAPS
100.0000 mg | ORAL_CAPSULE | Freq: Two times a day (BID) | ORAL | Status: DC
  Administered 2016-10-05 – 2016-10-06 (×2): 100 mg via ORAL
  Filled 2016-10-05 (×3): qty 1

## 2016-10-05 MED ORDER — IOPAMIDOL (ISOVUE-300) INJECTION 61%
INTRAVENOUS | Status: AC
  Filled 2016-10-05: qty 50

## 2016-10-05 MED ORDER — IOPAMIDOL (ISOVUE-370) INJECTION 76%
100.0000 mL | Freq: Once | INTRAVENOUS | Status: AC | PRN
  Administered 2016-10-05: 75 mL via INTRAVENOUS

## 2016-10-05 MED ORDER — ONDANSETRON HCL 4 MG/2ML IJ SOLN: 4.0000 mg | Freq: Once | INTRAMUSCULAR | Status: DC | PRN

## 2016-10-05 MED ORDER — PROPOFOL 10 MG/ML IV BOLUS
INTRAVENOUS | Status: AC
  Filled 2016-10-05: qty 20

## 2016-10-05 MED ORDER — PIPERACILLIN-TAZOBACTAM 3.375 G IVPB
3.3750 g | Freq: Once | INTRAVENOUS | Status: AC
  Administered 2016-10-05: 3.375 g via INTRAVENOUS
  Filled 2016-10-05: qty 50

## 2016-10-05 NOTE — Procedures (Signed)
Pre procedural Dx: [facility]  Post procedural Dx: Same  Technically successful CT guided ablation of 2 dominant lesions within the right lobe of the liver   EBL: Minimal.   Complications: None immediate.   Ronny Bacon, MD Pager #: [phone]

## 2016-10-05 NOTE — Anesthesia Preprocedure Evaluation (Addendum)
Anesthesia Evaluation  Patient identified by MRN, date of birth, ID band Patient awake    Reviewed: Allergy & Precautions, NPO status , Patient's Chart, lab work & pertinent test results  Airway Mallampati: II  TM Distance: >3 FB Neck ROM: Full    Dental  (+) Dental Advisory Given, Edentulous Upper, Edentulous Lower   Pulmonary Current Smoker,    Pulmonary exam normal breath sounds clear to auscultation       Cardiovascular hypertension, Pt. on medications Normal cardiovascular exam Rhythm:Regular Rate:Normal     Neuro/Psych PSYCHIATRIC DISORDERS Anxiety Depression negative neurological ROS     GI/Hepatic GERD  ,(+) Cirrhosis     substance abuse  alcohol use, Hepatitis -, CHepatocellular carcinoma   Endo/Other  negative endocrine ROS  Renal/GU negative Renal ROS     Musculoskeletal negative musculoskeletal ROS (+)   Abdominal   Peds  Hematology negative hematology ROS (+)   Anesthesia Other Findings Day of surgery medications reviewed with the patient.  Reproductive/Obstetrics                            Anesthesia Physical Anesthesia Plan  ASA: III  Anesthesia Plan: General   Post-op Pain Management:    Induction: Intravenous  Airway Management Planned: Oral ETT  Additional Equipment:   Intra-op Plan:   Post-operative Plan: Extubation in OR  Informed Consent: I have reviewed the patients History and Physical, chart, labs and discussed the procedure including the risks, benefits and alternatives for the proposed anesthesia with the patient or authorized representative who has indicated his/her understanding and acceptance.   Dental advisory given  Plan Discussed with: CRNA  Anesthesia Plan Comments: (Risks/benefits of general anesthesia discussed with patient including risk of damage to teeth, lips, gum, and tongue, nausea/vomiting, allergic reactions to medications, and  the possibility of heart attack, stroke and death.  All patient questions answered.  Patient wishes to proceed.)        Anesthesia Quick Evaluation

## 2016-10-05 NOTE — H&P (Signed)
Referring Physician(s): Danis,H/Drazek,D/Feng,Y  Supervising Physician: Sandi Mariscal  Patient Status:  [facility] OP TBA  Chief Complaint:  Multifocal hepatocellular carcinoma  Subjective: 61 year old male with past medical history of alcoholic cirrhosis, HTN, hepatitis C, tobacco abuse, and multifocal hepatocellular carcinoma.  Patient was seen in consultation with Dr. Ronny Bacon 08/30/16 and deemed an appropriate candidate for bland embolization of the [facility] prior to microwave ablation.  Patient [facility] to [facility]13[facility]16 for procedure which was successful and without immediate complication. He presents again today for CT-guided microwave ablation of the 2 right hepatocellular carcinomas as second part of staged treatment.Marland Kitchen He currently denies fever, headache, chest pain, dyspnea, cough, abdominal/back pain, nausea, vomiting or abnormal bleeding. Past Medical History:  Diagnosis Date  . Chronic alcoholism ([place])   . Cirrhosis ([place])   . Depression   . Glucose intolerance (impaired glucose tolerance)   . Hepatitis C   . Hypertension   . Tobacco use   . Umbilical hernia    Past Surgical History:  Procedure Laterality Date  . CATARACT EXTRACTION    . COLONOSCOPY  12/09  . INGUINAL HERNIA REPAIR    . IR 3D INDEPENDENT WKST  09/13/2016  . IR [place] ADDITIONAL VESSEL  09/13/2016  . IR [place] ADDITIONAL VESSEL  09/13/2016  . IR [place] ADDITIONAL VESSEL  09/13/2016  . IR [place] ADDITIONAL VESSEL  09/13/2016  . IR [place] ADDITIONAL VESSEL  09/13/2016  . IR ANGIOGRAM VISCERAL SELECTIVE  09/13/2016  . IR EMBO TUMOR ORGAN ISCHEMIA INFARCT INC GUIDE ROADMAPPING  09/13/2016  . IR US GUIDE VASC ACCESS RIGHT  09/13/2016  . left orchiectomy  07/2009  . UMBILICAL HERNIA REPAIR       Allergies: Patient has no known allergies.  Medications: Prior to Admission medications   Medication Sig Start Date End Date Taking? Authorizing  Provider  acetaminophen (TYLENOL) 325 MG tablet Take 650 mg by mouth every 6 (six) hours as needed (for headaches.).   Yes [provider]  b complex vitamins capsule Take 1 capsule by mouth daily.   Yes [provider]  lisinopril-hydrochlorothiazide (PRINZIDE,ZESTORETIC) 10-12.5 MG tablet TAKE 1 TABLET BY MOUTH EVERY DAY Patient taking differently: TAKE 1 TABLET BY MOUTH EVERY DAY IN THE MORNING 04/18/16  Yes Marletta Lor, MD  Multiple Vitamin (MULTIVITAMIN WITH MINERALS) TABS tablet Take 1 tablet by mouth daily.   Yes [provider]  sertraline (ZOLOFT) 100 MG tablet TAKE 1 TABLET BY MOUTH EVERY DAY Patient taking differently: TAKE 1 TABLET BY MOUTH EVERY DAY IN THE MORNING 05/29/16  Yes Marletta Lor, MD  traZODone (DESYREL) 50 MG tablet TAKE 2 TABLETS BY MOUTH AT BEDTIME Patient taking differently: TAKE 1 TABLET (50 MG) BY MOUTH AT BEDTIME AS NEEDED FOR SLEEP. 09/26/16  Yes Marletta Lor, MD  Aspirin-Salicylamide-Caffeine (BC FAST PAIN RELIEF) [phone] MG PACK Take 1 packet by mouth every 8 (eight) hours as needed (for pain/headache.).    [provider]     Vital Signs: BP 131/79   Pulse 89   Temp 97.7 F (36.5 C) (Oral)   Resp 16   Ht 5\' 11"  (1.803 m)   Wt 144 lb (65.3 kg)   SpO2 98%   BMI 20.08 kg/m   Physical Exam awake, alert. Chest clear to auscultation bilaterally. Heart with regular rate and rhythm. Abdomen soft, positive bowel sounds, nontender. No lower extremity edema. scatt ecchymoses of extremities.  Imaging:  Dg Chest 1 View  Result Date: 10/04/2016 CLINICAL DATA:  Preoperative evaluation for upcoming ablation EXAM: CHEST 1 VIEW COMPARISON:  01/26/2009 FINDINGS: Cardiac shadow is within normal limits. The lungs are well aerated bilaterally. Healed rib fractures are noted bilaterally. No focal infiltrate or sizable effusion is noted. IMPRESSION: No acute abnormality noted. Electronically Signed   By: Inez Catalina M.D.   On: 10/04/2016 10:58    Labs:  CBC:  Recent Labs  06/28/16 1012 09/13/16 0942 10/04/16 1000  WBC 8.3 10.7* 11.3*  HGB 14.3 14.3 14.4  HCT 42.0 40.8 41.0  PLT 237.0 254 254    COAGS:  Recent Labs  08/03/16 1221 09/13/16 0942 10/04/16 1000  INR 1.0 1.07 1.05  APTT  --  29  --     BMP:  Recent Labs  06/28/16 1012 08/28/16 0846 09/13/16 0942 10/04/16 1000  NA 137  --  140 137  K 4.3  --  3.5 4.0  CL 104  --  105 97*  CO2 29  --  25 29  GLUCOSE 109*  --  136* 114*  BUN 14  --  9 9  CALCIUM 8.9  --  9.1 9.2  CREATININE 0.56 0.60* 0.62 0.57*  GFRNONAA  --   --  >60 >60  GFRAA  --   --  >60 >60    LIVER FUNCTION TESTS:  Recent Labs  06/28/16 1012 09/13/16 0942 09/24/16 0959 10/04/16 1000  BILITOT 0.7 1.0 0.4 0.9  AST 77* 75* 82* 73*  ALT 41 39 45 36  ALKPHOS 174* 140* 204* 155*  PROT 7.3 8.0 7.3 7.7  ALBUMIN 3.8 3.9 3.7 3.6    Assessment and Plan: Patient with history of hepatitis C, cirrhosis, multifocal hepatocellular carcinoma, status post bland embolization on 09/13/16; presents again today for CT-guided microwave ablation of the right hepatic lobe HCC's. Details/risks of procedure, including but not limited to, internal bleeding, infection, injury to adjacent structures, anesthesia-related complications discussed with patient with his understanding and consent.  Post procedure he will be admitted for overnight observation.   Electronically Signed: D. Rowe Albi, PA-C 10/05/2016, 8:23 AM   I spent a total of 30 minutes at the the patient's bedside AND on the patient's hospital floor or unit, greater than 50% of which was counseling/coordinating care for CT-guided thermal ablation of hepatocellular carcinomas

## 2016-10-05 NOTE — Progress Notes (Signed)
Patient ID: Joshua [name], male   DOB: [birth year], 61 y.o.   MRN: [id number] Patient doing fairly well; only complaint is some mild right upper quadrant discomfort; has drank small amount liquids, has not eaten; denies nausea /vomiting Vital signs stable; afebrile Puncture sites right upper quadrant abdomen clean, dry, mildly tender to palpation; gauze dressing in place; abdomen soft Assessment/plan: Multifocal hepatocellular carcinoma, status post CT-guided thermal ablation of 2 dominant lesions within the right lobe earlier today. For overnight observation; Dilaudid PCA for pain; follow-up with Dr. Pascal Lux in [place] clinic with LFTs in 4 weeks.  Rowe Mikel, [place] [facility]

## 2016-10-05 NOTE — Anesthesia Procedure Notes (Signed)
Procedure Name: Intubation Date/Time: 10/05/2016 8:47 AM Performed by: Noralyn Pick D Pre-anesthesia Checklist: Patient identified, Emergency Drugs available, Suction available and Patient being monitored Patient Re-evaluated:Patient Re-evaluated prior to inductionOxygen Delivery Method: Circle system utilized Preoxygenation: Pre-oxygenation with 100% oxygen Intubation Type: IV induction Ventilation: Oral airway inserted - appropriate to patient size Laryngoscope Size: Mac and 4 Grade View: Grade I Tube type: Oral Tube size: 7.5 mm Number of attempts: 1 Airway Equipment and Method: Stylet Placement Confirmation: ETT inserted through vocal cords under direct vision,  positive ETCO2 and breath sounds checked- equal and bilateral Secured at: 23 cm Tube secured with: Tape Dental Injury: Teeth and Oropharynx as per pre-operative assessment

## 2016-10-05 NOTE — Transfer of Care (Signed)
Immediate Anesthesia Transfer of Care Note  Patient: Joshua [name]  Procedure(s) Performed: Procedure(s): LIVER MICROWAVE THERMAL ABLATION (N/A)  Patient Location: PACU  Anesthesia Type:General  Level of Consciousness: awake, alert  and oriented  Airway & Oxygen Therapy: Patient Spontanous Breathing and Patient connected to face mask oxygen  Post-op Assessment: Report given to RN and Post -op Vital signs reviewed and stable  Post vital signs: Reviewed and stable  Last Vitals:  Vitals:   10/05/16 0643  BP: 131/79  Pulse: 89  Resp: 16  Temp: 36.5 C    Last Pain:  Vitals:   10/05/16 0659  TempSrc:   PainSc: 0-No pain         Complications: No apparent anesthesia complications

## 2016-10-05 NOTE — Anesthesia Postprocedure Evaluation (Signed)
Anesthesia Post Note  Patient: Joshua [name]  Procedure(s) Performed: Procedure(s) (LRB): LIVER MICROWAVE THERMAL ABLATION (N/A)  Patient location during evaluation: PACU Anesthesia Type: General Level of consciousness: awake and alert and patient cooperative Pain management: pain level controlled Vital Signs Assessment: post-procedure vital signs reviewed and stable Respiratory status: spontaneous breathing and respiratory function stable Cardiovascular status: stable Anesthetic complications: no       Last Vitals:  Vitals:   10/05/16 1245 10/05/16 1257  BP:  (!) 151/79  Pulse: (!) 57 65  Resp: 13 13  Temp:  36.2 C    Last Pain:  Vitals:   10/05/16 1257  TempSrc: Oral  PainSc:                  [place]

## 2016-10-06 DIAGNOSIS — C220 Liver cell carcinoma: Secondary | ICD-10-CM | POA: Diagnosis not present

## 2016-10-06 LAB — CBC WITH DIFFERENTIAL/PLATELET
BASOS ABS: 0 10*3/uL (ref 0.0–0.1)
Basophils Relative: 0 %
EOS PCT: 0 %
Eosinophils Absolute: 0 10*3/uL (ref 0.0–0.7)
HCT: 36 % — ABNORMAL LOW (ref 39.0–52.0)
Hemoglobin: 12.3 g/dL — ABNORMAL LOW (ref 13.0–17.0)
LYMPHS ABS: 2.3 10*3/uL (ref 0.7–4.0)
Lymphocytes Relative: 12 %
MCH: 34.1 pg — ABNORMAL HIGH (ref 26.0–34.0)
MCHC: 34.2 g/dL (ref 30.0–36.0)
MCV: 99.7 fL (ref 78.0–100.0)
Monocytes Absolute: 1.3 10*3/uL — ABNORMAL HIGH (ref 0.1–1.0)
Monocytes Relative: 7 %
NEUTROS PCT: 81 %
Neutro Abs: 15.6 10*3/uL — ABNORMAL HIGH (ref 1.7–7.7)
Platelets: 199 10*3/uL (ref 150–400)
RBC: 3.61 MIL/uL — AB (ref 4.22–5.81)
RDW: 14.1 % (ref 11.5–15.5)
WBC: 19.2 10*3/uL — ABNORMAL HIGH (ref 4.0–10.5)

## 2016-10-06 LAB — COMPREHENSIVE METABOLIC PANEL
ALT: 76 U/L — AB (ref 17–63)
AST: 273 U/L — AB (ref 15–41)
Albumin: 3 g/dL — ABNORMAL LOW (ref 3.5–5.0)
Alkaline Phosphatase: 125 U/L (ref 38–126)
Anion gap: 8 (ref 5–15)
BUN: 10 mg/dL (ref 6–20)
CO2: 29 mmol/L (ref 22–32)
CREATININE: 0.5 mg/dL — AB (ref 0.61–1.24)
Calcium: 8 mg/dL — ABNORMAL LOW (ref 8.9–10.3)
Chloride: 102 mmol/L (ref 101–111)
Glucose, Bld: 119 mg/dL — ABNORMAL HIGH (ref 65–99)
POTASSIUM: 3 mmol/L — AB (ref 3.5–5.1)
Sodium: 139 mmol/L (ref 135–145)
Total Bilirubin: 1.3 mg/dL — ABNORMAL HIGH (ref 0.3–1.2)
Total Protein: 6.5 g/dL (ref 6.5–8.1)

## 2016-10-06 MED ORDER — HYDROCODONE-ACETAMINOPHEN 5-325 MG PO TABS: 1.0000 | ORAL_TABLET | ORAL | 0 refills | Status: DC | PRN

## 2016-10-06 MED ORDER — B COMPLEX-C PO TABS
1.0000 | ORAL_TABLET | Freq: Every day | ORAL | Status: DC
  Administered 2016-10-06: 1 via ORAL
  Filled 2016-10-06: qty 1

## 2016-10-06 NOTE — Discharge Summary (Signed)
    Patient ID: Joshua [name] MRN: [id number] DOB/AGE: [id number] y.o.  Admit date: 10/05/2016 Discharge date: 10/06/2016  Supervising Physician: Joshua [name]  Admission Diagnoses: [facility]  Discharge Diagnoses:  Active Problems:   Hepatocellular carcinoma [facility])   Discharged Condition: good  Hospital Course: The patient was admitted and underwent a CT guided thermal ablation of a liver lesion that is HCC.  The patient tolerated the procedure well.  He is not having any pain this morning and feels well.  He is tolerating a regular diet and voiding well with no issues.  He is currently stable for DC home.  His WBC is elevated this morning, but he is afebrile with no other complaints.  This is likely reactive from his procedure. No further intervention warranted.  Consults: None  Discharge Exam: Blood pressure 113/72, pulse (!) 56, temperature 98 F (36.7 C), temperature source Oral, resp. rate 16, height 5\' 11"  (1.803 m), weight 144 lb (65.3 kg), SpO2 97 %. General appearance: alert and no distress Resp: clear to auscultation bilaterally Cardio: regular rate and rhythm GI: soft, NT, ND, +BS, all stick sites are healing well with no drainage or erythema.  Disposition: 01-Home or Self Care   Allergies as of 10/06/2016   No Known Allergies     Medication List    TAKE these medications   acetaminophen 325 MG tablet Commonly known as:  TYLENOL Take 650 mg by mouth every 6 (six) hours as needed (for headaches.).   b complex vitamins capsule Take 1 capsule by mouth daily.   BC FAST PAIN RELIEF 650-195-33.3 MG Pack Generic drug:  Aspirin-Salicylamide-Caffeine Take 1 packet by mouth every 8 (eight) hours as needed (for pain/headache.).   HYDROcodone-acetaminophen 5-325 MG tablet Commonly known as:  NORCO Take 1-2 tablets by mouth every 4 (four) hours as needed for moderate pain.   lisinopril-hydrochlorothiazide 10-12.5 MG tablet Commonly known as:  PRINZIDE,ZESTORETIC TAKE 1  TABLET BY MOUTH EVERY DAY What changed:  See the new instructions.   multivitamin with minerals Tabs tablet Take 1 tablet by mouth daily.   sertraline 100 MG tablet Commonly known as:  ZOLOFT TAKE 1 TABLET BY MOUTH EVERY DAY What changed:  See the new instructions.   traZODone 50 MG tablet Commonly known as:  DESYREL TAKE 2 TABLETS BY MOUTH AT BEDTIME What changed:  See the new instructions.      Follow-up Information    Joshua Mariscal, MD Follow up in 1 month(s).   Specialty:  Interventional Radiology Why:  our office wil call you with appointment date and time Contact information: [address] [place] [address] [phone]            Electronically Signed: [name] 10/06/2016, 8:53 AM   I have spent Less Than 30 Minutes discharging Joshua [name].

## 2016-10-06 NOTE — Progress Notes (Signed)
Wasted Dilaudid 16 ml in sink with Nancy Marus, RN.

## 2016-10-06 NOTE — Progress Notes (Signed)
Patient discharged to home with family, discharge instructions reviewed with patient who verbalized understanding. New RX given to patient. 

## 2016-10-06 NOTE — Discharge Instructions (Signed)
May shower tomorrow. May remove dressing from site and place a bandaid. No submerging in water for 2 weeks.

## 2016-10-09 ENCOUNTER — Encounter (HOSPITAL_COMMUNITY): Payer: Self-pay | Admitting: Interventional Radiology

## 2016-10-09 ENCOUNTER — Other Ambulatory Visit: Payer: Self-pay | Admitting: *Deleted

## 2016-10-09 DIAGNOSIS — C220 Liver cell carcinoma: Secondary | ICD-10-CM

## 2016-10-26 ENCOUNTER — Encounter: Payer: Self-pay | Admitting: Interventional Radiology

## 2016-11-02 LAB — COMPLETE METABOLIC PANEL WITH GFR
ALBUMIN: 3.4 g/dL — AB (ref 3.6–5.1)
ALK PHOS: 239 U/L — AB (ref 40–115)
ALT: 31 U/L (ref 9–46)
AST: 58 U/L — AB (ref 10–35)
BUN: 11 mg/dL (ref 7–25)
CALCIUM: 8.8 mg/dL (ref 8.6–10.3)
CO2: 24 mmol/L (ref 20–31)
CREATININE: 0.59 mg/dL — AB (ref 0.70–1.25)
Chloride: 102 mmol/L (ref 98–110)
GFR, Est African American: >89 mL/min (ref >=60)
GFR, Est Non African American: >89 mL/min (ref >=60)
GLUCOSE: 115 mg/dL — AB (ref 65–99)
Potassium: 3.6 mmol/L (ref 3.5–5.3)
SODIUM: 139 mmol/L (ref 135–146)
TOTAL PROTEIN: 7.3 g/dL (ref 6.1–8.1)
Total Bilirubin: 0.7 mg/dL (ref 0.2–1.2)

## 2016-11-06 ENCOUNTER — Ambulatory Visit
Admission: RE | Admit: 2016-11-06 | Discharge: 2016-11-06 | Disposition: A | Discharge status: Home / Self Care | Payer: BLUE CROSS/BLUE SHIELD | Source: Ambulatory Visit | Attending: General Surgery | Admitting: General Surgery

## 2016-11-06 DIAGNOSIS — C220 Liver cell carcinoma: Secondary | ICD-10-CM

## 2016-11-06 HISTORY — PX: IR RADIOLOGIST EVAL & MGMT: IMG5224

## 2016-11-06 NOTE — Progress Notes (Signed)
Patient ID: Joshua [name], male   DOB: [birth year], 61 y.o.   MRN: [id number]         Chief Complaint: [facility]  Referring Physician(s): Danis (GI) Drazek (GI) Burr Medico (Oncology)  History of Present Illness:  Joshua [name] is a 61 y.o. male with past history significant for hypertension, smoking, hepatitis C and alcoholic cirrhosis who was found to have indeterminate liver lesions on abdominal ultrasound performed on 07/12/2016 obtained for a mildly elevated AFP level of 5.3 (obtained on 06/28/2016). Subsequent abdominal MRI performed 07/16/2016 demonstrated imaging findings worrisome for 2 discrete areas of hepatocellular carcinoma.   The patient was initially seen in consultation on 08/21/2016 and underwent technically successful Dyna CT augmented percutaneous bland embolization of both dominant hepatic lesions.  This was followed by technically successful percutaneous microwave ablation of both hepatic lesions on 10/05/2016.  He returns today to the interventional radiology clinic for postprocedural evaluation and management.  He is again accompanied by his mother and stepfather this serves as his own historian.  Patient has recovered completely from the microwave ablation.  He reports minimal fatigue however has returned to work and is functional with all activities of daily living.  Patient reports a minimal amount of bilateral lower extremity ankle swelling, left right. No increased abdominal girth.  No abdominal pain. No fever or chills.  No yellowing of the skin or eyes.  No change in mental status.    Past Medical History:  Diagnosis Date  . Chronic alcoholism ([place])   . Cirrhosis ([place])   . Depression   . Glucose intolerance (impaired glucose tolerance)   . Hepatitis C   . Hypertension   . Tobacco use   . Umbilical hernia     Past Surgical History:  Procedure Laterality Date  . CATARACT EXTRACTION    . COLONOSCOPY  12/09  . INGUINAL HERNIA REPAIR    . IR 3D INDEPENDENT WKST  09/13/2016    . IR [place] ADDITIONAL VESSEL  09/13/2016  . IR [place] ADDITIONAL VESSEL  09/13/2016  . IR [place] ADDITIONAL VESSEL  09/13/2016  . IR [place] ADDITIONAL VESSEL  09/13/2016  . IR [place] ADDITIONAL VESSEL  09/13/2016  . IR ANGIOGRAM VISCERAL SELECTIVE  09/13/2016  . IR EMBO TUMOR ORGAN ISCHEMIA INFARCT INC GUIDE ROADMAPPING  09/13/2016  . IR RADIOLOGIST EVAL & MGMT  08/21/2016  . IR RADIOLOGIST EVAL & MGMT  08/30/2016  . IR RADIOLOGIST EVAL & MGMT  09/26/2016  . IR US GUIDE VASC ACCESS RIGHT  09/13/2016  . left orchiectomy  07/2009  . RADIOFREQUENCY ABLATION N/A 10/05/2016   Procedure: LIVER MICROWAVE THERMAL ABLATION;  Surgeon: Sandi Mariscal, MD;  Location: [facility];  Service: Anesthesiology;  Laterality: N/A;  . UMBILICAL HERNIA REPAIR      Allergies: Patient has no known allergies.  Medications: Prior to Admission medications   Medication Sig Start Date End Date Taking? Authorizing Provider  acetaminophen (TYLENOL) 325 MG tablet Take 650 mg by mouth every 6 (six) hours as needed (for headaches.).   Yes [provider]  Aspirin-Salicylamide-Caffeine (BC FAST PAIN RELIEF) 650-195-33.3 MG PACK Take 1 packet by mouth every 8 (eight) hours as needed (for pain/headache.).   Yes [provider]  b complex vitamins capsule Take 1 capsule by mouth daily.   Yes [provider]  lisinopril-hydrochlorothiazide (PRINZIDE,ZESTORETIC) 10-12.5 MG tablet TAKE 1 TABLET BY MOUTH EVERY DAY Patient taking differently: TAKE 1 TABLET BY MOUTH EVERY  DAY IN THE MORNING 04/18/16  Yes Marletta Lor, MD  Multiple Vitamin (MULTIVITAMIN WITH MINERALS) TABS tablet Take 1 tablet by mouth daily.   Yes [provider]  traZODone (DESYREL) 50 MG tablet TAKE 2 TABLETS BY MOUTH AT BEDTIME Patient taking differently: TAKE 1 TABLET (50 MG) BY MOUTH AT BEDTIME AS NEEDED FOR SLEEP. 09/26/16  Yes Marletta Lor, MD   HYDROcodone-acetaminophen (NORCO) 5-325 MG tablet Take 1-2 tablets by mouth every 4 (four) hours as needed for moderate pain. Patient not taking: Reported on 11/06/2016 10/06/16   Saverio Danker, PA-C  sertraline (ZOLOFT) 100 MG tablet TAKE 1 TABLET BY MOUTH EVERY DAY Patient not taking: Reported on 11/06/2016 05/29/16   Marletta Lor, MD     Family History  Problem Relation Age of Onset  . Healthy Mother   . Colon cancer Neg Hx   . Stomach cancer Neg Hx     Social History   Social History  . Marital status: Single    Spouse name: N/A  . Number of children: N/A  . Years of education: N/A   Social History Main Topics  . Smoking status: Current Every Day Smoker    Packs/day: 0.50    Years: 42.00    Types: Cigarettes    Start date: 05/21/1971  . Smokeless tobacco: Former Systems developer     Comment: Has cut down to about 1/3 pack per day  . Alcohol use No     Comment: hx of nothing current   . Drug use: No  . Sexual activity: Not on file   Other Topics Concern  . Not on file   Social History Narrative  . No narrative on file    ECOG Status: 1 - Symptomatic but completely ambulatory  Review of Systems: A 12 point ROS discussed and pertinent positives are indicated in the HPI above.  All other systems are negative.  Review of Systems  Constitutional: Positive for fatigue. Negative for fever and unexpected weight change.  Respiratory: Negative.   Cardiovascular: Negative.   Gastrointestinal: Negative for abdominal distention and abdominal pain.    Vital Signs: BP (!) 172/75   Pulse 78   Temp 97.8 F (36.6 C) (Oral)   Resp 14   Ht 5\' 11"  (1.803 m)   Wt 144 lb (65.3 kg)   SpO2 100%   BMI 20.08 kg/m   Physical Exam  Abdominal:    Well healed ablation access sites.  Nursing note and vitals reviewed.    Imaging:  Selected images from the preprocedural abdominal CT performed 417/2018, bland hepatic embolization performed 09/13/2016 and ultrasound and CT-guided  microwave ablation performed 10/05/2016 were reviewed in detail with the patient and the patient's family.  Labs:  CBC:  Recent Labs  06/28/16 1012 09/13/16 0942 10/04/16 1000 10/06/16 0435  WBC 8.3 10.7* 11.3* 19.2*  HGB 14.3 14.3 14.4 12.3*  HCT 42.0 40.8 41.0 36.0*  PLT 237.0 254 254 199    COAGS:  Recent Labs  08/03/16 1221 09/13/16 0942 10/04/16 1000  INR 1.0 1.07 1.05  APTT  --  29  --     BMP:  Recent Labs  09/13/16 0942 10/04/16 1000 10/06/16 0435 11/01/16 1034  NA 140 137 139 139  K 3.5 4.0 3.0* 3.6  CL 105 97* 102 102  CO2 25 29 29 24   GLUCOSE 136* 114* 119* 115*  BUN 9 9 10 11   CALCIUM 9.1 9.2 8.0* 8.8  CREATININE 0.62 0.57* 0.50* 0.59*  GFRNONAA >  60 >60 >60 >89  GFRAA >60 >60 >60 >89    LIVER FUNCTION TESTS:  Recent Labs  09/24/16 0959 10/04/16 1000 10/06/16 0435 11/01/16 1034  BILITOT 0.4 0.9 1.3* 0.7  AST 82* 73* 273* 58*  ALT 45 36 76* 31  ALKPHOS 204* 155* 125 239*  PROT 7.3 7.7 6.5 7.3  ALBUMIN 3.7 3.6 3.0* 3.4*    TUMOR MARKERS:  Recent Labs  06/28/16 1012 09/24/16 0959  AFPTM 5.3 5.2    Assessment and Plan:  CAVAN BEARDEN is a 60 y.o. male with past history significant for hypertension, smoking, hepatitis C and alcoholic cirrhosis and multifocal hepatocellular carcinoma with associated mildly elevated AFP who underwent technically successful Dyna CT augmented percutaneous bland embolization of both dominant hepatic lesions on 09/13/16 and microwave ablation on 10/05/2016.    Patient has recovered completely from both procedures and is largely without complaint.  Selected images from the preprocedural abdominal CT performed 417/2018, bland hepatic embolization performed 09/13/2016 and ultrasound and CT-guided microwave ablation performed 10/05/2016 were reviewed in detail with the patient and the patient's family.  Post embolization AFP level is stable at 5.2 (obtained on 06/28/2016), previously, 5.3 (obtained on  09/24/2016) and his LFTs are unchanged to minimally improved following the microwave liver ablation.  PLAN: - Initial postprocedural abdominal MRI will be performed 3 months following the ablation (late August, early September of this year). A post procedural AFP level will also be obtained at that time.  - Patient will return to the interventional radiology clinic following the acquisition of this post procedural abdominal MRI and following the acquisition of AFP level.  - Hopefully, the surveillance abdominal MRI will show no new or residual enhancing disease, at which time, the patient will be referred to Dr. Beverley Fiedler for definitive hepatitis C treatment.   - He was encouraged to call the interventional radiology clinic with any interval questions or concerns.  Smoking and alcohol cessation was stressed in detail the patient.  A copy of this report was sent to the requesting provider on this date.  Electronically Signed: Sandi Mariscal 11/06/2016, 2:05 PM   I spent a total of 25 Minutes in face to face in clinical consultation, greater than 50% of which was counseling/coordinating care for posthepatic arterial bland embolization and percutaneous microwave ablation.

## 2016-12-17 ENCOUNTER — Other Ambulatory Visit: Payer: Self-pay | Admitting: *Deleted

## 2016-12-17 ENCOUNTER — Other Ambulatory Visit (HOSPITAL_COMMUNITY): Payer: Self-pay | Admitting: Interventional Radiology

## 2016-12-17 DIAGNOSIS — C220 Liver cell carcinoma: Secondary | ICD-10-CM

## 2017-01-04 LAB — CBC
HCT: 38.2 % — ABNORMAL LOW (ref 38.5–50.0)
HEMOGLOBIN: 12.7 g/dL — AB (ref 13.2–17.1)
MCH: 32.1 pg (ref 27.0–33.0)
MCHC: 33.2 g/dL (ref 32.0–36.0)
MCV: 96.5 fL (ref 80.0–100.0)
MPV: 9.9 fL (ref 7.5–12.5)
PLATELETS: 265 10*3/uL (ref 140–400)
RBC: 3.96 MIL/uL — ABNORMAL LOW (ref 4.20–5.80)
RDW: 15.3 % — AB (ref 11.0–15.0)
WBC: 10.3 10*3/uL (ref 3.8–10.8)

## 2017-01-05 LAB — COMPLETE METABOLIC PANEL WITH GFR
ALBUMIN: 3.4 g/dL — AB (ref 3.6–5.1)
ALT: 25 U/L (ref 9–46)
AST: 64 U/L — AB (ref 10–35)
Alkaline Phosphatase: 215 U/L — ABNORMAL HIGH (ref 40–115)
BUN: 7 mg/dL (ref 7–25)
CHLORIDE: 101 mmol/L (ref 98–110)
CO2: 25 mmol/L (ref 20–32)
Calcium: 8.4 mg/dL — ABNORMAL LOW (ref 8.6–10.3)
Creat: 0.47 mg/dL — ABNORMAL LOW (ref 0.70–1.25)
GFR, Est African American: >89 mL/min (ref >=60)
GFR, Est Non African American: >89 mL/min (ref >=60)
GLUCOSE: 85 mg/dL (ref 65–99)
POTASSIUM: 4.2 mmol/L (ref 3.5–5.3)
Sodium: 137 mmol/L (ref 135–146)
Total Bilirubin: 0.5 mg/dL (ref 0.2–1.2)
Total Protein: 7.3 g/dL (ref 6.1–8.1)

## 2017-01-05 LAB — PROTIME-INR
INR: 1
Prothrombin Time: 10.8 s (ref 9.0–11.5)

## 2017-01-10 ENCOUNTER — Ambulatory Visit (HOSPITAL_COMMUNITY)
Admission: RE | Admit: 2017-01-10 | Discharge: 2017-01-10 | Disposition: A | Discharge status: Home / Self Care | Payer: BLUE CROSS/BLUE SHIELD | Source: Ambulatory Visit | Attending: Interventional Radiology | Admitting: Interventional Radiology

## 2017-01-10 ENCOUNTER — Ambulatory Visit
Admission: RE | Admit: 2017-01-10 | Discharge: 2017-01-10 | Disposition: A | Discharge status: Home / Self Care | Payer: BLUE CROSS/BLUE SHIELD | Source: Ambulatory Visit | Attending: Interventional Radiology | Admitting: Interventional Radiology

## 2017-01-10 DIAGNOSIS — C220 Liver cell carcinoma: Secondary | ICD-10-CM | POA: Diagnosis not present

## 2017-01-10 DIAGNOSIS — K7460 Unspecified cirrhosis of liver: Secondary | ICD-10-CM | POA: Insufficient documentation

## 2017-01-10 DIAGNOSIS — K766 Portal hypertension: Secondary | ICD-10-CM | POA: Diagnosis not present

## 2017-01-10 DIAGNOSIS — K740 Hepatic fibrosis: Secondary | ICD-10-CM | POA: Diagnosis not present

## 2017-01-10 DIAGNOSIS — K8020 Calculus of gallbladder without cholecystitis without obstruction: Secondary | ICD-10-CM | POA: Diagnosis not present

## 2017-01-10 DIAGNOSIS — Z9889 Other specified postprocedural states: Secondary | ICD-10-CM | POA: Insufficient documentation

## 2017-01-10 DIAGNOSIS — I8500 Esophageal varices without bleeding: Secondary | ICD-10-CM | POA: Insufficient documentation

## 2017-01-10 HISTORY — PX: IR RADIOLOGIST EVAL & MGMT: IMG5224

## 2017-01-10 MED ORDER — GADOBENATE DIMEGLUMINE 529 MG/ML IV SOLN
15.0000 mL | Freq: Once | INTRAVENOUS | Status: AC | PRN
  Administered 2017-01-10: 13 mL via INTRAVENOUS

## 2017-01-10 NOTE — Progress Notes (Signed)
Patient ID: Joshua [name], male   DOB: 1955/10/04, 61 y.o.   MRN: [id number]        Chief Complaint: 3 month follow-up post hepatic microwave ablation  Referring Physician(s): Danis (GI) Drazek (GI) Burr Medico (Oncology) Burnice Logan (PCP_  History of Present Illness: Joshua [name] is a 61 y.o. male with past medical history significant for hypertension, smoking, hepatitis C and alcoholic cirrhosis who was found to have indeterminate liver lesions and abdominal ultrasound performed on 07/12/2013 obtained for a mildly elevated AFP level of 5.3 (obtained on 06/28/2016). Subsequent abdominal MRI performed 07/16/2016 demonstrated imaging findings worrisome for 2 discrete areas of hepatocellular carcinoma.  Patient subsequently underwent percutaneous transcatheter bland embolization of both hepatic lesions on 09/13/2016 followed by ultrasound and CT-guided microwave ablation on 10/05/2016. He returns to interventional radiology clinic today following the acquisition of initial post procedural abdominal MRI obtained earlier today. He is again accompanied by his mother and father though serves as his own historian.  Patient reports a minimal amount of fleeting right upper quadrant abdominal pain. He is otherwise without complaint.   No fever or chills. No unintentional weight loss or weight gain. No yellowing of the skin or eyes. No increased abdominal girth. No change in mental status.    Past Medical History:  Diagnosis Date  . Chronic alcoholism ([place])   . Cirrhosis ([place])   . Depression   . Glucose intolerance (impaired glucose tolerance)   . Hepatitis C   . Hypertension   . Tobacco use   . Umbilical hernia     Past Surgical History:  Procedure Laterality Date  . CATARACT EXTRACTION    . COLONOSCOPY  12/09  . INGUINAL HERNIA REPAIR    . IR 3D INDEPENDENT WKST  09/13/2016  . IR [place] ADDITIONAL VESSEL  09/13/2016  . IR [place] ADDITIONAL VESSEL  09/13/2016  . IR  [place] ADDITIONAL VESSEL  09/13/2016  . IR [place] ADDITIONAL VESSEL  09/13/2016  . IR [place] ADDITIONAL VESSEL  09/13/2016  . IR ANGIOGRAM VISCERAL SELECTIVE  09/13/2016  . IR EMBO TUMOR ORGAN ISCHEMIA INFARCT INC GUIDE ROADMAPPING  09/13/2016  . IR RADIOLOGIST EVAL & MGMT  08/21/2016  . IR RADIOLOGIST EVAL & MGMT  08/30/2016  . IR RADIOLOGIST EVAL & MGMT  09/26/2016  . IR US GUIDE VASC ACCESS RIGHT  09/13/2016  . left orchiectomy  07/2009  . RADIOFREQUENCY ABLATION N/A 10/05/2016   Procedure: LIVER MICROWAVE THERMAL ABLATION;  Surgeon: Sandi Mariscal, MD;  Location: [facility];  Service: Anesthesiology;  Laterality: N/A;  . UMBILICAL HERNIA REPAIR      Allergies: Patient has no known allergies.  Medications: Prior to Admission medications   Medication Sig Start Date End Date Taking? Authorizing Provider  acetaminophen (TYLENOL) 325 MG tablet Take 650 mg by mouth every 6 (six) hours as needed (for headaches.).   Yes [provider]  Aspirin-Salicylamide-Caffeine (BC FAST PAIN RELIEF) 650-195-33.3 MG PACK Take 1 packet by mouth every 8 (eight) hours as needed (for pain/headache.).   Yes [provider]  b complex vitamins capsule Take 1 capsule by mouth daily.   Yes [provider]  lisinopril-hydrochlorothiazide (PRINZIDE,ZESTORETIC) 10-12.5 MG tablet TAKE 1 TABLET BY MOUTH EVERY DAY Patient taking differently: TAKE 1 TABLET BY MOUTH EVERY DAY IN THE MORNING 04/18/16  Yes Marletta Lor, MD  Multiple Vitamin (MULTIVITAMIN WITH MINERALS) TABS tablet Take 1 tablet by mouth daily.   Yes [provider]  traZODone (DESYREL) 50 MG tablet TAKE 2 TABLETS BY MOUTH AT BEDTIME Patient taking differently: TAKE 1 TABLET (50 MG) BY MOUTH AT BEDTIME AS NEEDED FOR SLEEP. 09/26/16  Yes Marletta Lor, MD     Family History  Problem Relation Age of Onset  . Healthy Mother   . Colon cancer Neg Hx   . Stomach cancer Neg Hx       Social History   Social History  . Marital status: Single    Spouse name: N/A  . Number of children: N/A  . Years of education: N/A   Social History Main Topics  . Smoking status: Current Every Day Smoker    Packs/day: 0.50    Years: 42.00    Types: Cigarettes    Start date: 05/21/1971  . Smokeless tobacco: Former Systems developer     Comment: Has cut down to about 1/3 pack per day  . Alcohol use No     Comment: hx of nothing current   . Drug use: No  . Sexual activity: Not Asked   Other Topics Concern  . None   Social History Narrative  . None    ECOG Status: 0 - Asymptomatic  Review of Systems: A 12 point ROS discussed and pertinent positives are indicated in the HPI above.  All other systems are negative.  Review of Systems  Vital Signs: BP (!) 155/98 (BP Location: Right Arm, Patient Position: Sitting, Cuff Size: Normal)   Pulse 89   Temp 98.1 F (36.7 C)   Resp 20   Ht 5\' 11"  (1.803 m)   Wt 145 lb (65.8 kg)   SpO2 97%   BMI 20.22 kg/m   Physical Exam   Imaging:  Abdominal MRI - earlier same day; 07/16/2016  Mr Abdomen Wwo Contrast  Result Date: 01/10/2017 CLINICAL DATA:  Cirrhosis with hepatocellular carcinoma status post ablation of 2 hepatic lesions. EXAM: MRI ABDOMEN WITHOUT AND WITH CONTRAST TECHNIQUE: Multiplanar multisequence MR imaging of the abdomen was performed both before and after the administration of intravenous contrast. CONTRAST:  39mL MULTIHANCE GADOBENATE DIMEGLUMINE 529 MG/ML IV SOLN COMPARISON:  MRI 07/16/2016 FINDINGS: Lower chest: No worrisome lung lesions, pleural or pericardial effusion. Hepatobiliary: Severe cirrhotic changes involving the liver with areas of confluent hepatic fibrosis. Markedly enlarged caudate lobe. Stable portal venous hypertension, portal venous collaterals and esophageal varices. No splenomegaly or ascites. The the ablation site at the hepatic dome in segment the demonstrates some residual central hematoma but no  worrisome enhancement to suggest residual or recurrent tumor. The larger lesion and segment 6 also demonstrates minimal residual hematoma but no worrisome enhancement to suggest residual or recurrent tumor. Slight nodular enhancement around the lesion is likely due to granulation tissue. There is also faint arterial blood chain around the lesion which is likely shunting. There is a small early arterial phase enhancing lesion measuring 10 mm in the right hepatic lobe posteriorly on image number 41 of series 901. This may have been present on the prior study but was quite small measuring approximately 4.5 mm. This could be a dysplastic nodule or small HCC and will require surveillance. There are also other areas of probable vascular shunting which appears stable mainly at the dome of the liver. No intrahepatic biliary dilatation. Stable gallstones. No common bile duct dilatation. Small amount of perihepatic fluid. Pancreas:  No mass, inflammation or ductal dilatation. Spleen:  Normal size.  No focal lesions. Adrenals/Urinary Tract: The adrenal glands and kidneys appear stable and unremarkable.  Stomach/Bowel: Visualized portions within the abdomen are unremarkable. Vascular/Lymphatic: The aorta and branch vessels are patent. Moderate atherosclerotic calcifications are again demonstrated. The major venous structures are patent. Elongated and stretched and mildly compressed right renal vein due to the markedly enlarged caudate lobe of the liver. Small scattered lymph nodes are stable and likely related to the patient's cirrhosis. Other:  Small amount of perihepatic fluid. Musculoskeletal: No significant bony findings. IMPRESSION: 1. Expected postprocedural changes from ablation of 2 hepatic masses. No complicating features are demonstrated. No findings suspicious for residual or recurrent tumor. 2. 10 mm early arterial phase enhancing nodule in the right hepatic lobe posteriorly could be a dysplastic nodule or early [place].  Recommend continued surveillance. 3. Severe cirrhotic changes involving the liver with areas of confluent hepatic fibrosis and markedly enlarged caudate lobe. Stable portal venous collaterals, portal venous hypertension and esophageal varices. No splenomegaly or ascites. 4. Stable cholelithiasis. Electronically Signed   By: Marijo Sanes M.D.   On: 01/10/2017 11:07    Labs:  CBC:  Recent Labs  09/13/16 0942 10/04/16 1000 10/06/16 0435 01/04/17 1050  WBC 10.7* 11.3* 19.2* 10.3  HGB 14.3 14.4 12.3* 12.7*  HCT 40.8 41.0 36.0* 38.2*  PLT 254 254 199 265    COAGS:  Recent Labs  08/03/16 1221 09/13/16 0942 10/04/16 1000 01/04/17 1050  INR 1.0 1.07 1.05 1.0  APTT  --  29  --   --     BMP:  Recent Labs  10/04/16 1000 10/06/16 0435 11/01/16 1034 01/04/17 1050  NA 137 139 139 137  K 4.0 3.0* 3.6 4.2  CL 97* 102 102 101  CO2 29 29 24 25   GLUCOSE 114* 119* 115* 85  BUN 9 10 11 7   CALCIUM 9.2 8.0* 8.8 8.4*  CREATININE 0.57* 0.50* 0.59* 0.47*  GFRNONAA >60 >60 >89 >89  GFRAA >60 >60 >89 >89    LIVER FUNCTION TESTS:  Recent Labs  10/04/16 1000 10/06/16 0435 11/01/16 1034 01/04/17 1050  BILITOT 0.9 1.3* 0.7 0.5  AST 73* 273* 58* 64*  ALT 36 76* 31 25  ALKPHOS 155* 125 239* 215*  PROT 7.7 6.5 7.3 7.3  ALBUMIN 3.6 3.0* 3.4* 3.4*    TUMOR MARKERS:  Recent Labs  06/28/16 1012 09/24/16 0959  AFPTM 5.3 5.2    Assessment and Plan:  Joshua [name] is a 61 y.o. male with past medical history significant for hypertension, smoking, hepatitis C and alcoholic cirrhosis post percutaneous transcatheter bland embolization of both hepatic lesions on 09/13/2016 followed by ultrasound and CT-guided microwave ablation on 10/05/2016.   Patient reports a minimal amount of fleeting right upper quadrant abdominal pain. He is otherwise without complaint.   Selected images from abdominal MRI performed earlier today as well as procedural abdominal MRI performed 07/16/2016 reviewed  in detail with the patient and the patient's family.  I am pleased to response that the abdominal MRI demonstrated technically excellent result without evidence of worrisome residual enhancing tissue about either ablation zone to suggest locally recurrent or residual disease.   Note is again noted made of a punctate (approximately 1 cm) indeterminate lesion within the subcapsular aspect of the posterior segment of the right lobe of the liver which we will continue to observe. No additional new sites of disease are identified. No evidence of complication.  While ordered, I don't see the most recent AFP level, but hopefully this would support the imaging findings of disease remission.   Plan:  - Surveillance abdominal MRI  will be performed in 3 months (December of this year), after which the patient will return to the interventional radiology clinic for evaluation and management.  An AFP level will be drawn around the time of his MRI.  - Patient will be referred to Dr. Beverley Fiedler for definitive hepatitis C treatment as now he is presumed to be hepatocellular carcinoma free.  Patient was again encouraged to call the interventional radiology clinic with any interval questions or concerns.  Smoking and alcohol cessation was again stressed in detail with the patient.  A copy of this report was sent to the requesting provider on this date.  Electronically Signed: Sandi Mariscal 01/10/2017, 4:51 PM   I spent a total of 25 Minutes in face to face in clinical consultation, greater than 50% of which was counseling/coordinating care for post hepatic ablation.

## 2017-02-25 ENCOUNTER — Encounter: Payer: Self-pay | Admitting: Interventional Radiology

## 2017-02-26 ENCOUNTER — Telehealth: Payer: Self-pay | Admitting: Internal Medicine

## 2017-02-26 NOTE — Telephone Encounter (Signed)
Pt need new Rx for lisinopril-hydrochlorothiazide, trazodone and sertraline   Pharm:  [facility] on [address]

## 2017-02-28 MED ORDER — LISINOPRIL-HYDROCHLOROTHIAZIDE 10-12.5 MG PO TABS: 1.0000 | ORAL_TABLET | Freq: Every day | ORAL | 0 refills | Status: DC

## 2017-02-28 MED ORDER — TRAZODONE HCL 50 MG PO TABS: 100.0000 mg | ORAL_TABLET | Freq: Every day | ORAL | 0 refills | Status: DC

## 2017-02-28 NOTE — Telephone Encounter (Signed)
Medications were refilled, informed pt to d/c Zoloft

## 2017-02-28 NOTE — Telephone Encounter (Signed)
Pt would like to know if he should start his sertraline 50 mg again. Please advise  Pt d/c the medication due to it causing him to be drowsy.

## 2017-02-28 NOTE — Telephone Encounter (Signed)
Okay to refill medications Discontinue sertraline

## 2017-03-01 ENCOUNTER — Other Ambulatory Visit: Payer: Self-pay | Admitting: Internal Medicine

## 2017-03-06 ENCOUNTER — Telehealth: Payer: Self-pay | Admitting: Gastroenterology

## 2017-03-06 NOTE — Telephone Encounter (Signed)
I reviewed an office note from the [facility] dated 02/08/17. They are considering hepatitis C treatment for this patient.  Due to his cirrhosis, they recommend an EGD to screen for varices.  Please contact the patient and ask if he feels ready to do that.  I believe he has upcoming follow up with the interventional radiologist to follow his liver cancer after its treatments.  The EGD would be for cirrhosis, screen for esophageal varices.  It can be done in the [place], routine.

## 2017-03-07 NOTE — Telephone Encounter (Signed)
Spoke to patient, he is not interested right now in scheduling EGD to screen for varices. He is fixated on getting his ultrasound scheduled, states that [facility] office is arranging for this to be done. He will call back to our office to schedule EGD.

## 2017-03-21 ENCOUNTER — Encounter: Payer: Self-pay | Admitting: General Surgery

## 2017-04-01 ENCOUNTER — Encounter: Payer: Self-pay | Admitting: Gastroenterology

## 2017-04-16 ENCOUNTER — Other Ambulatory Visit: Payer: Self-pay | Admitting: Radiology

## 2017-04-16 ENCOUNTER — Encounter: Payer: Self-pay | Admitting: Radiology

## 2017-04-16 ENCOUNTER — Other Ambulatory Visit (HOSPITAL_COMMUNITY): Payer: Self-pay | Admitting: Interventional Radiology

## 2017-04-16 ENCOUNTER — Other Ambulatory Visit: Payer: Self-pay

## 2017-04-16 ENCOUNTER — Ambulatory Visit (AMBULATORY_SURGERY_CENTER): Payer: Self-pay

## 2017-04-16 VITALS — Ht 71.0 in | Wt 154.4 lb

## 2017-04-16 DIAGNOSIS — C220 Liver cell carcinoma: Secondary | ICD-10-CM

## 2017-04-16 DIAGNOSIS — I8500 Esophageal varices without bleeding: Secondary | ICD-10-CM

## 2017-04-16 NOTE — Progress Notes (Signed)
Denies allergies to eggs or soy products. Denies complication of anesthesia or sedation. Denies use of weight loss medication. Denies use of O2.   Emmi instructions declined.  

## 2017-04-18 ENCOUNTER — Other Ambulatory Visit (HOSPITAL_COMMUNITY): Payer: Self-pay | Admitting: Interventional Radiology

## 2017-04-22 ENCOUNTER — Ambulatory Visit (HOSPITAL_COMMUNITY): Admission: RE | Admit: 2017-04-22 | Payer: BLUE CROSS/BLUE SHIELD | Source: Ambulatory Visit

## 2017-04-23 ENCOUNTER — Other Ambulatory Visit: Payer: BLUE CROSS/BLUE SHIELD

## 2017-04-23 LAB — BUN: BUN: 7 mg/dL (ref 7–25)

## 2017-04-23 LAB — CREATININE WITH EST GFR
CREATININE: 0.61 mg/dL — AB (ref 0.70–1.25)
GFR, EST NON AFRICAN AMERICAN: 108 mL/min/{1.73_m2} (ref >=60)
GFR, Est African American: 125 mL/min/{1.73_m2} (ref >=60)

## 2017-04-23 LAB — AFP TUMOR MARKER: AFP-Tumor Marker: 8.7 ng/mL — ABNORMAL HIGH (ref <6.1)

## 2017-04-26 ENCOUNTER — Ambulatory Visit (HOSPITAL_COMMUNITY)
Admission: RE | Admit: 2017-04-26 | Discharge: 2017-04-26 | Disposition: A | Discharge status: Home / Self Care | Payer: BLUE CROSS/BLUE SHIELD | Source: Ambulatory Visit | Attending: Interventional Radiology | Admitting: Interventional Radiology

## 2017-04-26 DIAGNOSIS — K766 Portal hypertension: Secondary | ICD-10-CM | POA: Insufficient documentation

## 2017-04-26 DIAGNOSIS — K7460 Unspecified cirrhosis of liver: Secondary | ICD-10-CM | POA: Insufficient documentation

## 2017-04-26 DIAGNOSIS — R188 Other ascites: Secondary | ICD-10-CM | POA: Diagnosis not present

## 2017-04-26 DIAGNOSIS — C220 Liver cell carcinoma: Secondary | ICD-10-CM | POA: Insufficient documentation

## 2017-04-26 DIAGNOSIS — I8510 Secondary esophageal varices without bleeding: Secondary | ICD-10-CM | POA: Insufficient documentation

## 2017-04-26 DIAGNOSIS — I700 Atherosclerosis of aorta: Secondary | ICD-10-CM | POA: Diagnosis not present

## 2017-04-26 MED ORDER — GADOBENATE DIMEGLUMINE 529 MG/ML IV SOLN
15.0000 mL | Freq: Once | INTRAVENOUS | Status: AC | PRN
  Administered 2017-04-26: 14 mL via INTRAVENOUS

## 2017-04-29 ENCOUNTER — Ambulatory Visit (AMBULATORY_SURGERY_CENTER): Payer: BLUE CROSS/BLUE SHIELD | Admitting: Gastroenterology

## 2017-04-29 ENCOUNTER — Other Ambulatory Visit: Payer: Self-pay | Admitting: Internal Medicine

## 2017-04-29 ENCOUNTER — Encounter: Payer: Self-pay | Admitting: Gastroenterology

## 2017-04-29 VITALS — BP 104/72 | HR 85 | Temp 97.8°F | Resp 13 | Ht 71.0 in | Wt 150.0 lb

## 2017-04-29 DIAGNOSIS — I8500 Esophageal varices without bleeding: Secondary | ICD-10-CM | POA: Diagnosis present

## 2017-04-29 MED ORDER — SODIUM CHLORIDE 0.9 % IV SOLN: 500.0000 mL | INTRAVENOUS | Status: AC

## 2017-04-29 NOTE — Progress Notes (Signed)
Called to room to assist during endoscopic procedure.  Patient ID and intended procedure confirmed with present staff. Received instructions for my participation in the procedure from the performing physician.  

## 2017-04-29 NOTE — Progress Notes (Signed)
Report given to PACU, vss 

## 2017-04-29 NOTE — Patient Instructions (Signed)
YOU HAD AN ENDOSCOPIC PROCEDURE TODAY AT [facility]:   Refer to the procedure report that was given to you for any specific questions about what was found during the examination.  If the procedure report does not answer your questions, please call your gastroenterologist to clarify.  If you requested that your care partner not be given the details of your procedure findings, then the procedure report has been included in a sealed envelope for you to review at your convenience later.  YOU SHOULD EXPECT: Some feelings of bloating in the abdomen. Passage of more gas than usual.  Walking can help get rid of the air that was put into your GI tract during the procedure and reduce the bloating. If you had a lower endoscopy (such as a colonoscopy or flexible sigmoidoscopy) you may notice spotting of blood in your stool or on the toilet paper. If you underwent a bowel prep for your procedure, you may not have a normal bowel movement for a few days.  Please Note:  You might notice some irritation and congestion in your nose or some drainage.  This is from the oxygen used during your procedure.  There is no need for concern and it should clear up in a day or so.  SYMPTOMS TO REPORT IMMEDIATELY:    Following upper endoscopy (EGD)  Vomiting of blood or coffee ground material  New chest pain or pain under the shoulder blades  Painful or persistently difficult swallowing  New shortness of breath  Fever of 100F or higher  Black, tarry-looking stools  For urgent or emergent issues, a gastroenterologist can be reached at any hour by calling [phone].   DIET:  We do recommend a small meal at first, but then you may proceed to your regular diet.  Drink plenty of fluids but you should avoid alcoholic beverages for 24 hours.  ACTIVITY:  You should plan to take it easy for the rest of today and you should NOT DRIVE or use heavy machinery until tomorrow (because of the sedation medicines used  during the test).    FOLLOW UP: Our staff will call the number listed on your records the next business day following your procedure to check on you and address any questions or concerns that you may have regarding the information given to you following your procedure. If we do not reach you, we will leave a message.  However, if you are feeling well and you are not experiencing any problems, there is no need to return our call.  We will assume that you have returned to your regular daily activities without incident.  If any biopsies were taken you will be contacted by phone or by letter within the next 1-3 weeks.  Please call us at [phone] if you have not heard about the biopsies in 3 weeks.    SIGNATURES/CONFIDENTIALITY: You and/or your care partner have signed paperwork which will be entered into your electronic medical record.  These signatures attest to the fact that that the information above on your After Visit Summary has been reviewed and is understood.  Full responsibility of the confidentiality of this discharge information lies with you and/or your care-partner.   Thank you for choosing [place] to take care of your health care needs.

## 2017-04-29 NOTE — Op Note (Signed)
[facility] Patient Name: Joshua [name] Procedure Date: 04/29/2017 10:05 AM MRN: [id number] Endoscopist: Mallie Mussel L. Loletha Carrow , MD Age: 61 Referring MD:  Date of Birth: 1955-10-08 Gender: Male Account #: [id number] Procedure:                Upper GI endoscopy Indications:              Cirrhosis rule out esophageal varices Medicines:                Monitored Anesthesia Care Procedure:                Pre-Anesthesia Assessment:                           - Prior to the procedure, a History and Physical                            was performed, and patient medications and                            allergies were reviewed. The patient's tolerance of                            previous anesthesia was also reviewed. The risks                            and benefits of the procedure and the sedation                            options and risks were discussed with the patient.                            All questions were answered, and informed consent                            was obtained. Prior Anticoagulants: The patient has                            taken no previous anticoagulant or antiplatelet                            agents. ASA Grade Assessment: III - A patient with                            severe systemic disease. After reviewing the risks                            and benefits, the patient was deemed in                            satisfactory condition to undergo the procedure.                           After obtaining informed consent, the endoscope was  passed under direct vision. Throughout the                            procedure, the patient's blood pressure, pulse, and                            oxygen saturations were monitored continuously. The                            Model GIF-HQ190 [id number]) scope was introduced                            through the mouth, and advanced to the second part                            of duodenum.  The upper GI endoscopy was                            accomplished without difficulty. The patient                            tolerated the procedure well. Scope In: Scope Out: Findings:                 The larynx was normal.                           The esophagus was normal.                           Moderate portal hypertensive gastropathy was found                            in the entire examined stomach. Biopsies were taken                            with a cold forceps for Helicobacter pylori testing                            using CLOtest.                           The cardia and gastric fundus were normal on                            retroflexion.                           The examined duodenum was normal. Complications:            No immediate complications. Estimated Blood Loss:     Estimated blood loss: none. Estimated blood loss                            was minimal. Impression:               - Normal larynx.                           -  Normal esophagus.                           - Portal hypertensive gastropathy. Biopsied.                           - Normal examined duodenum. Recommendation:           - Patient has a contact number available for                            emergencies. The signs and symptoms of potential                            delayed complications were discussed with the                            patient. Return to normal activities tomorrow.                            Written discharge instructions were provided to the                            patient.                           - Resume previous diet.                           - Continue present medications.                           - Await pathology results.                           - Repeat upper endoscopy in 3 years for variceal                            screening. Henry L. Loletha Carrow, MD 04/29/2017 10:21:06 AM This report has been signed electronically. CC Letter to:             [name], NP ([place])

## 2017-04-30 ENCOUNTER — Telehealth: Payer: Self-pay | Admitting: *Deleted

## 2017-04-30 NOTE — Telephone Encounter (Signed)
  Follow up Call-  Call back number 04/29/2017  Post procedure Call Back phone  # [phone]  Permission to leave phone message Yes  Some recent data might be hidden     Patient questions:  Do you have a fever, pain , or abdominal swelling? No. Pain Score  0 *  Have you tolerated food without any problems? Yes.    Have you been able to return to your normal activities? Yes.    Do you have any questions about your discharge instructions: Diet   No. Medications  No. Follow up visit  No.  Do you have questions or concerns about your Care? Yes.    Actions: * If pain score is 4 or above: No action needed, pain <4.

## 2017-05-01 LAB — HELICOBACTER PYLORI SCREEN-BIOPSY: UREASE: NEGATIVE

## 2017-05-16 ENCOUNTER — Encounter: Payer: Self-pay | Admitting: *Deleted

## 2017-05-16 ENCOUNTER — Ambulatory Visit
Admission: RE | Admit: 2017-05-16 | Discharge: 2017-05-16 | Disposition: A | Discharge status: Home / Self Care | Payer: BLUE CROSS/BLUE SHIELD | Source: Ambulatory Visit | Attending: Interventional Radiology | Admitting: Interventional Radiology

## 2017-05-16 ENCOUNTER — Other Ambulatory Visit: Payer: Self-pay | Admitting: *Deleted

## 2017-05-16 ENCOUNTER — Telehealth: Payer: Self-pay | Admitting: *Deleted

## 2017-05-16 DIAGNOSIS — C220 Liver cell carcinoma: Secondary | ICD-10-CM

## 2017-05-16 HISTORY — PX: IR RADIOLOGIST EVAL & MGMT: IMG5224

## 2017-05-16 LAB — COMPLETE METABOLIC PANEL WITH GFR
AG Ratio: 1.1 (calc) (ref 1.0–2.5)
ALBUMIN MSPROF: 3.7 g/dL (ref 3.6–5.1)
ALT: 34 U/L (ref 9–46)
AST: 84 U/L — ABNORMAL HIGH (ref 10–35)
Alkaline phosphatase (APISO): 231 U/L — ABNORMAL HIGH (ref 40–115)
BILIRUBIN TOTAL: 0.6 mg/dL (ref 0.2–1.2)
BUN / CREAT RATIO: 11 (calc) (ref 6–22)
BUN: 7 mg/dL (ref 7–25)
CALCIUM: 9.3 mg/dL (ref 8.6–10.3)
CHLORIDE: 98 mmol/L (ref 98–110)
CO2: 25 mmol/L (ref 20–32)
CREATININE: 0.63 mg/dL — AB (ref 0.70–1.25)
GFR, EST AFRICAN AMERICAN: 123 mL/min/{1.73_m2} (ref >=60)
GFR, Est Non African American: 106 mL/min/{1.73_m2} (ref >=60)
GLUCOSE: 111 mg/dL (ref 65–139)
Globulin: 3.5 g/dL (calc) (ref 1.9–3.7)
Potassium: 3.5 mmol/L (ref 3.5–5.3)
Sodium: 135 mmol/L (ref 135–146)
TOTAL PROTEIN: 7.2 g/dL (ref 6.1–8.1)

## 2017-05-16 NOTE — Progress Notes (Signed)
Patient ID: Joshua [name], male   DOB: 08-Dec-1955, 62 y.o.   MRN: [id number]         Chief Complaint: Patient was seen in consultation today for  Chief Complaint  Patient presents with  . Follow-up    8 mo follow up Thermal Ablation of Liver/Bland Embolization of Liver   at the request of Abbagail Scaff  Referring Physician(s): Drazek (GI) Burr Medico (Oncology) Burnice Logan (PCP)  History of Present Illness: Joshua [name] is a 62 y.o. male with past medical history significant for hypertension, smoking, hepatitis C and alcoholic cirrhosis who was found to have an indeterminate liver lesions on abdominal ultrasound performed 07/12/2013 obtained for a mildly elevated AFP level of 5.3 (obtained on 06/28/2016). Patient subsequently underwent abdominal MRI in 07/16/2016 which showed findings worrisome for 2 discrete areas of hepatocellular carcinoma. For this, the patient underwent bland embolization of both hepatic lesions on 09/13/2016 followed by ultrasound and CT-guided microwave ablation on 10/05/2016. Initial surveillance abdominal MRI performed 01/10/2017 demonstrated a technically excellent result without evidence of residual disease or new area of [place]. Patient returns to the interventional radiology Department following the acquisition of surveillance MRI performed 04/26/2017. He is again accompanied by his mother oh serves as his own historian.  Patient is again without complaint. No increased abdominal distention. No abdominal pain. No hematemesis. No bloody or melanotic stools. No yellowing of the skin or eyes. No change in mental status. No unintentional weight loss. No change in energy level. No fever or chills.  Past Medical History:  Diagnosis Date  . Cancer ([place])   . Cataract   . Chronic alcoholism ([place])   . Cirrhosis ([place])   . Depression   . Glucose intolerance (impaired glucose tolerance)   . Hepatitis C   . Hypertension   . Tobacco use   . Umbilical hernia     Past Surgical History:    Procedure Laterality Date  . CATARACT EXTRACTION    . COLONOSCOPY  12/09  . INGUINAL HERNIA REPAIR    . IR 3D INDEPENDENT WKST  09/13/2016  . IR [place] ADDITIONAL VESSEL  09/13/2016  . IR [place] ADDITIONAL VESSEL  09/13/2016  . IR [place] ADDITIONAL VESSEL  09/13/2016  . IR [place] ADDITIONAL VESSEL  09/13/2016  . IR [place] ADDITIONAL VESSEL  09/13/2016  . IR ANGIOGRAM VISCERAL SELECTIVE  09/13/2016  . IR EMBO TUMOR ORGAN ISCHEMIA INFARCT INC GUIDE ROADMAPPING  09/13/2016  . IR RADIOLOGIST EVAL & MGMT  08/21/2016  . IR RADIOLOGIST EVAL & MGMT  08/30/2016  . IR RADIOLOGIST EVAL & MGMT  09/26/2016  . IR RADIOLOGIST EVAL & MGMT  01/10/2017  . IR RADIOLOGIST EVAL & MGMT  11/06/2016  . IR RADIOLOGIST EVAL & MGMT  05/16/2017  . IR US GUIDE VASC ACCESS RIGHT  09/13/2016  . left orchiectomy  07/2009  . RADIOFREQUENCY ABLATION N/A 10/05/2016   Procedure: LIVER MICROWAVE THERMAL ABLATION;  Surgeon: Sandi Mariscal, MD;  Location: [facility];  Service: Anesthesiology;  Laterality: N/A;  . UMBILICAL HERNIA REPAIR      Allergies: Patient has no known allergies.  Medications: Prior to Admission medications   Medication Sig Start Date End Date Taking? Authorizing Provider  acetaminophen (TYLENOL) 325 MG tablet Take 650 mg by mouth every 6 (six) hours as needed (for headaches.).   Yes [provider]  b complex vitamins capsule Take 1 capsule by mouth daily.   Yes [provider]  lisinopril-hydrochlorothiazide (PRINZIDE,ZESTORETIC) 10-12.5 MG tablet Take 1 tablet by mouth daily. 02/28/17  Yes Marletta Lor, MD  Multiple Vitamin (MULTIVITAMIN WITH MINERALS) TABS tablet Take 1 tablet by mouth daily.   Yes [provider]  traZODone (DESYREL) 50 MG tablet TAKE 2 TABLETS BY MOUTH EVERY NIGHT AT BEDTIME 03/01/17  Yes Marletta Lor, MD  lisinopril-hydrochlorothiazide (PRINZIDE,ZESTORETIC) 10-12.5 MG tablet  TAKE 1 TABLET BY MOUTH EVERY DAY 03/01/17   Marletta Lor, MD  sertraline (ZOLOFT) 100 MG tablet TAKE 1 TABLET BY MOUTH EVERY DAY Patient not taking: Reported on 05/16/2017 04/30/17   Marletta Lor, MD     Family History  Problem Relation Age of Onset  . Healthy Mother   . Colon cancer Neg Hx   . Stomach cancer Neg Hx   . Esophageal cancer Neg Hx   . Rectal cancer Neg Hx     Social History   Socioeconomic History  . Marital status: Single    Spouse name: Not on file  . Number of children: Not on file  . Years of education: Not on file  . Highest education level: Not on file  Social Needs  . Financial resource strain: Not on file  . Food insecurity - worry: Not on file  . Food insecurity - inability: Not on file  . Transportation needs - medical: Not on file  . Transportation needs - non-medical: Not on file  Occupational History  . Not on file  Tobacco Use  . Smoking status: Current Every Day Smoker    Packs/day: 0.50    Years: 42.00    Pack years: 21.00    Types: Cigarettes    Start date: 05/21/1971  . Smokeless tobacco: Former Systems developer  . Tobacco comment: Has cut down to about 1/3 pack per day  Substance and Sexual Activity  . Alcohol use: No    Alcohol/week: 16.8 oz    Types: 28 Cans of beer per week    Comment: hx of nothing current   . Drug use: No  . Sexual activity: Not on file  Other Topics Concern  . Not on file  Social History Narrative  . Not on file    ECOG Status: 0 - Asymptomatic  Review of Systems: A 12 point ROS discussed and pertinent positives are indicated in the HPI above.  All other systems are negative.  Review of Systems  Vital Signs: BP (!) 148/84   Pulse 89   Temp 98.5 F (36.9 C) (Oral)   Resp 14   Ht 5\' 11"  (1.803 m)   Wt 150 lb (68 kg)   SpO2 99%   BMI 20.92 kg/m   Physical Exam  Imaging:  Selected images from abdominal MRI performed 04/26/2017 and 12/14/2016 were reviewed in detail with the patient and the  patient's mother.  Joshua [name]  Result Date: 04/26/2017 CLINICAL DATA:  Status post thermal ablation of liver lesions. Cirrhosis and hepatocellular carcinoma. EXAM: MRI ABDOMEN WITHOUT AND WITH [name] TECHNIQUE: Multiplanar multisequence Joshua imaging of the abdomen was performed both before and after the administration of intravenous [name]. [name]:  25mL MULTIHANCE GADOBENATE DIMEGLUMINE 529 MG/ML IV SOLN COMPARISON:  01/10/2017 FINDINGS: Lower chest: No acute findings. Hepatobiliary: Advanced changes of cirrhosis identified. The caudate lobe of liver is markedly enlarged. Portal venous hypertension with portal venous collaterals and esophageal varices identified. The ablation site along the hepatic dome is again identified. This measures 2.6 cm, image 31 of series 1005. Along the cranial margin  of the ablation zone is a new area of arterial phase enhancement measuring 2.8 cm, image 23 of series 1001. On the portal venous phase there is pseudo capsule appearance of this lesion and findings are worrisome for recurrent hepatocellular carcinoma. The second ablation zone is again noted within segment 6 measuring 5.4 cm, image 76 of series 1001. Previously 5.9 cm. Slightly caudal to this is a large area of arterial phase enhancement which measures 7.2 cm, image 60 of series 1001. This demonstrates delayed washout and is worrisome for [place]. Additionally, there are numerous new small arterial phase lesions identified throughout both lobes of liver concerning for multicentric HCC. Pancreas: No mass, inflammatory changes, or other parenchymal abnormality identified. Spleen:  Within normal limits in size and appearance. Adrenals/Urinary Tract: No masses identified. No evidence of hydronephrosis. Stomach/Bowel: Visualized portions within the abdomen are unremarkable. Vascular/Lymphatic: Aortic atherosclerosis. No aneurysm. No upper abdominal adenopathy identified. Other:  Small amount of perihepatic  ascites. Musculoskeletal: No suspicious bone lesions identified. IMPRESSION: 1. Although the ablation zones along the dome of liver in segment 6 of the right lobe demonstrate interval decrease in size from previous exam there are multiple new arterial phase enhancing lesions involving both lobes of liver worrisome for recurrent multicentric HCC. 2.  Aortic Atherosclerosis (ICD10-I70.0). Electronically Signed   By: Kerby Moors M.D.   On: 04/26/2017 20:07   Ir Radiologist Eval & Mgmt  Result Date: 05/16/2017 Please refer to notes tab for details about interventional procedure. (Op Note)   Labs:  CBC: Recent Labs    09/13/16 0942 10/04/16 1000 10/06/16 0435 01/04/17 1050  WBC 10.7* 11.3* 19.2* 10.3  HGB 14.3 14.4 12.3* 12.7*  HCT 40.8 41.0 36.0* 38.2*  PLT 254 254 199 265    COAGS: Recent Labs    08/03/16 1221 09/13/16 0942 10/04/16 1000 01/04/17 1050  INR 1.0 1.07 1.05 1.0  APTT  --  29  --   --     BMP: Recent Labs    10/04/16 1000 10/06/16 0435 11/01/16 1034 01/04/17 1050 04/19/17 1436  NA 137 139 139 137  --   K 4.0 3.0* 3.6 4.2  --   CL 97* 102 102 101  --   CO2 29 29 24 25   --   GLUCOSE 114* 119* 115* 85  --   BUN 9 10 11 7 7   CALCIUM 9.2 8.0* 8.8 8.4*  --   CREATININE 0.57* 0.50* 0.59* 0.47* 0.61*  GFRNONAA >60 >60 >89 >89 108  GFRAA >60 >60 >89 >89 125    LIVER FUNCTION TESTS: Recent Labs    10/04/16 1000 10/06/16 0435 11/01/16 1034 01/04/17 1050  BILITOT 0.9 1.3* 0.7 0.5  AST 73* 273* 58* 64*  ALT 36 76* 31 25  ALKPHOS 155* 125 239* 215*  PROT 7.7 6.5 7.3 7.3  ALBUMIN 3.6 3.0* 3.4* 3.4*    TUMOR MARKERS: Recent Labs    06/28/16 1012 09/24/16 0959 04/19/17 1436  AFPTM 5.3 5.2 8.7*    Assessment and Plan:  Joshua [name] is a 62 y.o. male with past medical history significant for hypertension, smoking, hepatitis C and alcoholic cirrhosis who was found to have an indeterminate liver lesions on abdominal ultrasound performed 07/12/2013  obtained for a mildly elevated AFP level of 5.3 (obtained on 06/28/2016). Patient subsequently underwent abdominal MRI in 07/16/2016 which showed findings worrisome for 2 discrete areas of hepatocellular carcinoma. For this, the patient underwent bland embolization of both hepatic lesions on 09/13/2016 followed by ultrasound  and CT-guided microwave ablation on 10/05/2016. Initial surveillance abdominal MRI performed 01/10/2017 demonstrated a technically excellent result without evidence of residual disease or new area of [place]. Patient returns to the interventional radiology Department following the acquisition of surveillance MRI performed 04/26/2017.   Patient is again without complaint.   Selected images from abdominal MRI performed 04/26/2017 and 12/14/2016 were reviewed in detail with the patient and the patient's mother.  While both ablation sites look good without evidence of residual or recurrent disease, unfortunately, the abdominal MRI performed 04/26/2017 demonstrates development of multiple (>3) hyper enhancing liver lesions within both the right and left lobes of the liver worrisome for development of multifocal hepatocellular carcinoma.  These findings are associated with an elevation in the patient's AFP level, currently at 8.7 (obtained on 04/19/2017), previously 5.2 (09/24/2016).  I expressed great disappointment at this unexpected turn of events.   I explained that the most appropriate order of business is for the patient to return to his oncologist, Dr. Burr Medico as she will ultimately determine the patient's appropriate plan of care.   I explained that as the patient now has greater than 3 lesions, he is no longer a candidate for percutaneous ablative therapies.  If liver directed therapy is pursued, I would likely initially pursue Y 90 radioembolization (assuming the patient's liver function tests remain normal).  Note, Y 90 will be performed in-lieu of bland embolization as bland embolization  could be performed in the future following Y 90 radial embolization however Y 90 can NOT be performed following another episode of bland embolization (given arterial stasis and poor delivery of the radiotracer).  I explained that transcatheter treatment options are performed for palliative purposes however could assist in achieving disease improvement / stability for several months though I am concerned given his rapidly advancing disease.  PLAN: - Consultation with Dr. Burr Medico. - IF liver directed therapy is desired, we will see the patient in a dedicated consultation for Y-90 (the patient and the patient' mother were unable to process additional information at the time of today's appointment given the unfortunate findings on the MRI).  Note, to expedite the process, Y-90 approval paperwork was began at today's appointment.   - IF Y-90 is desired, we will performed the procedure at [facility] on an out patient basis however 3 separate procedures will be necessary given his bilobar disease (a mapping procedure, right-sided embolization, left-sided embolization).  The patient the patient's family are encouraged to call the interventional radiology clinic with any future questions or concerns.  A copy of this report was sent to the requesting provider on this date.  Electronically Signed: Sandi Mariscal 05/16/2017, 2:05 PM   I spent a total of 25 Minutes in face to face in clinical consultation, greater than 50% of which was counseling/coordinating care for [facility], post microwave ablation.

## 2017-05-16 NOTE — Telephone Encounter (Signed)
Received call from Dr. Deniece Portela nurse at [place] re:  Dr. Francena Hanly had seen pt since April 2018.  Pt has had several procedures performed.  However, the most recent MRI showed new multiple liver lesions.  Dr. Pascal Lux would like to refer pt back to Dr. Burr Medico for follow up. Dr. Deniece Portela  Nurse   Phone    [phone].

## 2017-05-17 ENCOUNTER — Telehealth: Payer: Self-pay | Admitting: Hematology

## 2017-05-17 NOTE — Telephone Encounter (Signed)
Called in to reschedule it was to early for his ride

## 2017-05-17 NOTE — Telephone Encounter (Signed)
Scheduled appt per 1/3 sch message - Patient is aware of appt date and time.  

## 2017-05-20 NOTE — Progress Notes (Signed)
Joshua [name]  [phone] [phone]  Clinic Follow Up Note   Patient Care Team: Joshua Lor, [name] as PCP - General Joshua [name], Joshua Corin, [name] as Consulting Physician (Gastroenterology) Joshua Mariscal, [name] as Consulting Physician (Interventional Radiology) Joshua [name] as Consulting Physician (Hematology)   Date of Service:  05/21/2017   CHIEF COMPLAINTS:  Follow up hepatocellular carcinoma  Oncology History   Cancer Staging Hepatocellular carcinoma [facility]) Staging form: Liver, AJCC 8th Edition - Clinical stage from 08/01/2016: Stage II (cT2(m), cN0, cM0) - Signed by Joshua [name] on 08/24/2016       Hepatocellular carcinoma ([place])   06/28/2016 Tumor Marker    AFP 5.3      07/12/2016 Imaging    US Abdomen 07/12/16 IMPRESSION: 1. There is shadowing gallstone within gallbladder measures 1.6 cm. No sonographic Murphy's sign. No thickening of gallbladder wall. Normal CBD. 2. Again noted heterogeneous increased echogenicity of the liver with nodular contour consistent with cirrhosis. There is hypoechoic lesion in right hepatic lobe measures 1.7 x 2.4 cm. There is a second hypoechoic lesion in inferior aspect of the right hepatic lobe measures 4.6 x 3.6 cm. This is ill defined. Further evaluation with enhanced MRI is recommended to exclude evolving hepatic masses. 3. No hydronephrosis or renal calculi. 4. No aortic aneurysm.      07/16/2016 Imaging    MRI Abdomen w wo Contrast 07/16/16 IMPRESSION: 1. Two enhancing lesions in the RIGHT hepatic lobe on the background of hepatitis-C and liver cirrhosis are consistent with hepatocellular carcinoma. Recommend multidisciplinary GI, surgical and oncological consultation. 2. Morphologic changes of cirrhosis. 3. No ascites.  Patent portal veins.      07/16/2016 Initial Diagnosis    Hepatocellular carcinoma ([place])      09/13/2016 Procedure    Bland liver embolization before microwave ablation, Dr. Pascal [name]         10/05/2016 Surgery    LIVER MICROWAVE THERMAL ABLATION by Dr. Pascal [name]  10/05/16      01/10/2017 Imaging    MRI Abdomen W WO Contrast 01/10/17 IMPRESSION: 1. Expected postprocedural changes from ablation of 2 hepatic masses. No complicating features are demonstrated. No findings suspicious for residual or recurrent tumor. 2. 10 mm early arterial phase enhancing nodule in the right hepatic lobe posteriorly could be a dysplastic nodule or early Joshua [name]. Recommend continued surveillance. 3. Severe cirrhotic changes involving the liver with areas of confluent hepatic fibrosis and markedly enlarged caudate lobe. Stable portal venous collaterals, portal venous hypertension and esophageal varices. No splenomegaly or ascites. 4. Stable cholelithiasis.       04/26/2017 Imaging    MRI Abdomen W Wo Contrast 04/26/17 IMPRESSION: 1. Although the ablation zones along the dome of liver in segment 6 of the right lobe demonstrate interval decrease in size from previous exam there are multiple new arterial phase enhancing lesions involving both lobes of liver worrisome for recurrent multicentric HCC. 2.  Aortic Atherosclerosis (ICD10-I70.0).      04/29/2017 Procedure    Upper Endoscopy by Dr. Pincus [name] 04/29/17 IMPRESSION - Normal larynx. - Normal esophagus. - Portal hypertensive gastropathy. Biopsied. - Normal examined duodenum.       HISTORY OF PRESENTING ILLNESS (08/24/2016):  Joshua [name] 62 y.o. male is here because of a new diagnosis of hepatocellular carcinoma.  The patient had an US of the abdomen on 07/12/16 for a personal history of chronic hepatitis-C and alcoholism complicated by cirrhosis. This a showed a gallstone in the gallbladder measuring 1.6 cm,  heterogeneous increased echogenicity of the liver with nodular contour consistent with cirrhosis, a hypoechoic lesion in right hepatic lobe measuring 1.7 x 2.4 cm, and a second ill-defined hypoechoic lesion in inferior aspect of the  right hepatic lobe measuring  4.6 x 3.6 cm.  MRI of the abdomen on 07/16/16 showed two enhancing lesions in the right hepatic lobe (1.7 cm and 1.9 cm)  in the background of hepatitis-C and liver cirrhosis consistent with hepatocellular carcinoma. These findings are associated with a mildly elevated AFT level of 5.3 obtained on 06/28/16.  The patient was referred to Dr. Pascal [name] of IR on 08/21/16 to discuss treatment options. He explained that the goal standard is surgical resection and he may benefit from input from Dr. Barry [name] regarding operative candidacy. If he is not an operative candidate,conversations were held with the patient regarding potential percutaneous treatment options; such as microwave ablation and transcatheter embolization. Dr. Pascal [name] recommended cirrhotic protocol CTA of the abd/pelvis to better delinate the patient's hepatic arterial supply and determine the true size of the ill-defined lesion in the caudal aspect of the right hepatic lobe. He will return afterwards to Dr. Pascal [name] to further discuss his treatment plan. The patient's case was also discussed with Joshua Locks, NP who wished the patient to undergo definitive hepatocellular carcinoma treatment prior to the hepatitis-C treatment.  The patient presents today with his mother and Joshua [name], his step-father, to discuss possible systemic treatments for the management of his disease. He denies nausea, bloating, changes in bowel habits, or abdominal pain. He used Trazodone to sleep. He denies hematemesis or hematochezia. The patient states his hep-C was an incidental finding. He reports easily bruising.   CURRENT THERAPY  Observation    INTERVAL HISTORY  Joshua [name] is here for a follow up post liver ablation. He was last seen by me 9 months. He presents to the clinic today accompanied by his family. He notes when he took his MRI he was not breathing for an extended period of time. He wonders if that would effects the quality of the  image. The image overall was not compromised.  He saw Dr. Pascal [name] last month who proposed Y90 treatment. He notes he has no pain, trouble eating nor trouble with bowels. He denies change in medication.      MEDICAL HISTORY:  Past Medical History:  Diagnosis Date  . Cancer ([place])   . Cataract   . Chronic alcoholism ([place])   . Cirrhosis ([place])   . Depression   . Glucose intolerance (impaired glucose tolerance)   . Hepatitis C   . Hypertension   . Tobacco use   . Umbilical hernia     SURGICAL HISTORY: Past Surgical History:  Procedure Laterality Date  . CATARACT EXTRACTION    . COLONOSCOPY  12/09  . INGUINAL HERNIA REPAIR    . IR 3D INDEPENDENT WKST  09/13/2016  . IR Joshua [name] ADDITIONAL VESSEL  09/13/2016  . IR [place] ADDITIONAL VESSEL  09/13/2016  . IR [place] ADDITIONAL VESSEL  09/13/2016  . IR [place] ADDITIONAL VESSEL  09/13/2016  . IR [place] ADDITIONAL VESSEL  09/13/2016  . IR ANGIOGRAM VISCERAL SELECTIVE  09/13/2016  . IR EMBO TUMOR ORGAN ISCHEMIA INFARCT INC GUIDE ROADMAPPING  09/13/2016  . IR RADIOLOGIST EVAL & MGMT  08/21/2016  . IR RADIOLOGIST EVAL & MGMT  08/30/2016  . IR RADIOLOGIST EVAL & MGMT  09/26/2016  . IR RADIOLOGIST EVAL & MGMT  01/10/2017  . IR RADIOLOGIST EVAL & MGMT  11/06/2016  . IR RADIOLOGIST EVAL & MGMT  05/16/2017  . IR US GUIDE VASC ACCESS RIGHT  09/13/2016  . left orchiectomy  07/2009  . RADIOFREQUENCY ABLATION N/A 10/05/2016   Procedure: LIVER MICROWAVE THERMAL ABLATION;  Surgeon: Joshua Mariscal, [name];  Location: [facility]S;  Service: Anesthesiology;  Laterality: N/A;  . UMBILICAL HERNIA REPAIR      SOCIAL HISTORY: Social History   Socioeconomic History  . Marital status: Single    Spouse name: Not on file  . Number of children: Not on file  . Years of education: Not on file  . Highest education level: Not on file  Social Needs  . Financial resource strain: Not on file  . Food  insecurity - worry: Not on file  . Food insecurity - inability: Not on file  . Transportation needs - medical: Not on file  . Transportation needs - non-medical: Not on file  Occupational History  . Not on file  Tobacco Use  . Smoking status: Current Every Day Smoker    Packs/day: 0.50    Years: 42.00    Pack years: 21.00    Types: Cigarettes    Start date: 05/21/1971  . Smokeless tobacco: Former Systems developer  . Tobacco comment: Has cut down to about 1/3 pack per day  Substance and Sexual Activity  . Alcohol use: No    Alcohol/week: 16.8 oz    Types: 28 Cans of beer per week    Comment: hx of nothing current   . Drug use: No  . Sexual activity: Not on file  Other Topics Concern  . Not on file  Social History Narrative  . Not on file   He works as a Hydrologist.  FAMILY HISTORY: Family History  Problem Relation Age of Onset  . Healthy Mother   . Colon cancer Neg Hx   . Stomach cancer Neg Hx   . Esophageal cancer Neg Hx   . Rectal cancer Neg Hx     ALLERGIES:  has No Known Allergies.  MEDICATIONS:  Current Outpatient Medications  Medication Sig Dispense Refill  . acetaminophen (TYLENOL) 325 MG tablet Take 650 mg by mouth every 6 (six) hours as needed (for headaches.).    Marland Kitchen b complex vitamins capsule Take 1 capsule by mouth daily.    Marland Kitchen lisinopril-hydrochlorothiazide (PRINZIDE,ZESTORETIC) 10-12.5 MG tablet Take 1 tablet by mouth daily. 90 tablet 0  . lisinopril-hydrochlorothiazide (PRINZIDE,ZESTORETIC) 10-12.5 MG tablet TAKE 1 TABLET BY MOUTH EVERY DAY 90 tablet 0  . Multiple Vitamin (MULTIVITAMIN WITH MINERALS) TABS tablet Take 1 tablet by mouth daily.    . sertraline (ZOLOFT) 100 MG tablet TAKE 1 TABLET BY MOUTH EVERY DAY 90 tablet 0  . traZODone (DESYREL) 50 MG tablet TAKE 2 TABLETS BY MOUTH EVERY NIGHT AT BEDTIME 180 tablet 0   Current Facility-Administered Medications  Medication Dose Route Frequency Provider Last Rate Last Dose  . 0.9 %  sodium chloride infusion  500  mL Intravenous Continuous Joshua [name], Estill Cotta III, [name]        REVIEW OF SYSTEMS:   Constitutional: Denies fevers, chills or abnormal night sweats Eyes: Denies blurriness of vision, double vision or watery eyes Ears, nose, mouth, throat, and face: Denies mucositis or sore throat Respiratory: Denies cough, dyspnea or wheezes Cardiovascular: Denies palpitation, chest discomfort or lower extremity swelling Gastrointestinal:  Denies nausea, heartburn or change in bowel habits Skin: Denies abnormal skin rashes.  (+) Bruises on his  forearms. Lymphatics: Denies new lymphadenopathy or easy bruising Neurological:Denies numbness, tingling or new weaknesses Behavioral/Psych: Mood is stable, no new changes  All other systems were reviewed with the patient and are negative.  PHYSICAL EXAMINATION: ECOG PERFORMANCE STATUS: 0 - Asymptomatic  Vitals:   05/21/17 1334  BP: (!) 143/83  Pulse: 89  Resp: 18  Temp: 98.5 F (36.9 C)  SpO2: 100%   Filed Weights   05/21/17 1334  Weight: 153 lb (69.4 kg)    GENERAL:alert, no distress and comfortable SKIN: skin color, texture, turgor are normal, no rashes or significant lesions (+) Multiple ecchymoses of his bilateral forearms. EYES: normal, conjunctiva are pink and non-injected, sclera clear OROPHARYNX:no exudate, no erythema and lips, buccal mucosa, and tongue normal  NECK: supple, thyroid normal size, non-tender, without nodularity LYMPH:  no palpable lymphadenopathy in the cervical, axillary or inguinal LUNGS: clear to auscultation and percussion with normal breathing effort HEART: regular rate. (+) systolic murmur in the metrial valve area. no lower extremity edema ABDOMEN:abdomen soft, normal bowel sounds (+) Liver enlarged from 2 cm to 7 cm now below the ribcage, non-tender. Musculoskeletal:no cyanosis of digits and no clubbing  PSYCH: alert & oriented x 3 with fluent speech NEURO: no focal motor/sensory deficits  LABORATORY DATA:  I have reviewed  the data as listed CBC Latest Ref Rng & Units 01/04/2017 10/06/2016 10/04/2016  WBC 3.8 - 10.8 K/uL 10.3 19.2(H) 11.3(H)  Hemoglobin 13.2 - 17.1 g/dL 12.7(L) 12.3(L) 14.4  Hematocrit 38.5 - 50.0 % 38.2(L) 36.0(L) 41.0  Platelets 140 - 400 K/uL 265 199 254   CMP Latest Ref Rng & Units 05/16/2017 04/19/2017 01/04/2017  Glucose 65 - 139 mg/dL 111 - 85  BUN 7 - 25 mg/dL 7 7 7   Creatinine 0.70 - 1.25 mg/dL 0.63(L) 0.61(L) 0.47(L)  Sodium 135 - 146 mmol/L 135 - 137  Potassium 3.5 - 5.3 mmol/L 3.5 - 4.2  Chloride 98 - 110 mmol/L 98 - 101  CO2 20 - 32 mmol/L 25 - 25  Calcium 8.6 - 10.3 mg/dL 9.3 - 8.4(L)  Total Protein 6.1 - 8.1 g/dL 7.2 - 7.3  Total Bilirubin 0.2 - 1.2 mg/dL 0.6 - 0.5  Alkaline Phos 40 - 115 U/L - - 215(H)  AST 10 - 35 U/L 84(H) - 64(H)  ALT 9 - 46 U/L 34 - 25   PROCEDURES  Upper Endoscopy by Dr. Pincus [name] 04/29/17 IMPRESSION - Normal larynx. - Normal esophagus. - Portal hypertensive gastropathy. Biopsied. - Normal examined duodenum.   RADIOGRAPHIC STUDIES: I have personally reviewed the radiological images as listed and agreed with the findings in the report.   MRI Abdomen W Wo Contrast 04/26/17 IMPRESSION: 1. Although the ablation zones along the dome of liver in segment 6 of the right lobe demonstrate interval decrease in size from previous exam there are multiple new arterial phase enhancing lesions involving both lobes of liver worrisome for recurrent multicentric HCC. 2.  Aortic Atherosclerosis (ICD10-I70.0).   MRI Abdomen W WO Contrast 01/10/17 IMPRESSION: 1. Expected postprocedural changes from ablation of 2 hepatic masses. No complicating features are demonstrated. No findings suspicious for residual or recurrent tumor. 2. 10 mm early arterial phase enhancing nodule in the right hepatic lobe posteriorly could be a dysplastic nodule or early [place]. Recommend continued surveillance. 3. Severe cirrhotic changes involving the liver with areas of confluent  hepatic fibrosis and markedly enlarged caudate lobe. Stable portal venous collaterals, portal venous hypertension and esophageal varices. No splenomegaly or ascites. 4. Stable cholelithiasis.  MRI Abdomen w wo Contrast 07/16/16 IMPRESSION: 1. Two enhancing lesions in the RIGHT hepatic lobe on the background of hepatitis-C and liver cirrhosis are consistent with hepatocellular carcinoma. Recommend multidisciplinary GI, surgical and oncological consultation. 2. Morphologic changes of cirrhosis. 3. No ascites.  Patent portal veins.  US Abdomen 07/12/16 IMPRESSION: 1. There is shadowing gallstone within gallbladder measures 1.6 cm. No sonographic Murphy's sign. No thickening of gallbladder wall. Normal CBD. 2. Again noted heterogeneous increased echogenicity of the liver with nodular contour consistent with cirrhosis. There is hypoechoic lesion in right hepatic lobe measures 1.7 x 2.4 cm. There is a second hypoechoic lesion in inferior aspect of the right hepatic lobe measures 4.6 x 3.6 cm. This is ill defined. Further evaluation with enhanced MRI is recommended to exclude evolving hepatic masses. 3. No hydronephrosis or renal calculi. 4. No aortic aneurysm.  ASSESSMENT & PLAN: 62 y.o. Caucasian male with a personal history of alcohol abuse, smoking, cirrhosis of the liver, and chronic hepatitis-C.  1. Hepatocellular Carcinoma, multifocal (2) in right lobe, stage II, progressed in liver 04/2017  -The patient's imaging findings was discussed in detail. - Giving his typial MRI image findings, which is consistent with hepatocellular carcinoma, and underline liver cirrhosis, his diagnosis is quite certain. This was the consensus from our GI tumor board and tissue biopsy was not felt to be necessary.  -Due to his significant liver cirrhosis and multifocal disease, surgeon Dr. Barry [name] did not feel he is a candidate for surgical candidate  -Due to his alcohol abuse, he is currently not a  candidate for liver transplant. However he has stopped drinking alcohol, he has been referred to liver clinic to see NP Joshua [name] to discuss liver transplant. The appointment has been scheduled.  -The patient saw Dr. Pascal [name] in March 2018 and underwent Liver embolization and ablation by Dr. Pascal [name] in 09/2016.  -MRI Abdomen from 01/10/17 showed the treated lesions were smaller.  -MRI abdomen in 04/26/17 which showed multiple new lesions in the liver, suspicious for recurrence. This was reviewed with pt -I will present his case to the GI Tumor Board  -At this point surgery is not an option. The goal of care is not curative but to control his disease.  -Treatment options include Liver target Y90 treatment or systemic treatment. His current liver function is well but I am concerned about his liver function may be compromised after radioembolization treatment.  -we discussed systemic therapy options, including first line sorafenib or levantinib, immunotherapy with Nivolumab or Keytruda, and chemotherapy etc. If Y-90 does have significant risk of liver toxicities, then I would prefer him to start systemic therapy  -I recommend a CT Chest to rule out metastasis and help narrow down treatment options.  -F/u open until further evaluation.   2. Alcohol Cessation -The patient has gradually lessened the amount he drank over time. -He has completely stopped drinking alcohol in March 2018. -I encouraged the patient to continue abstaining from alcohol consumption.  3. Smoking Cessation -He is down 1/2 ppd. Used be smoking 1 ppd. -We discussed smoking cessation and he is working on it.  4. Hepatitis-C, untreated -The patient has a history of chronic hepatitis-C. -he will follow up with Joshua Locks, NP, and possible receive hep C treatment after his cancer treatment.  5. 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 Loletha Carrow and [facility]  PLAN -CT chest wo contrast in 1-2 weeks -GI tumor board discussion next week, to determine Y-90 vs systemic therapy as next step -I will call him after above discussion, or see him back    Orders Placed This Encounter  Procedures  . CT Chest Wo Contrast    Standing Status:   Future    Standing Expiration Date:   05/21/2018    Order Specific Question:   Preferred imaging location?    Answer:   [facility]    Order Specific Question:   Radiology Contrast Protocol - do NOT remove file path    Answer:   [email or web address]    All questions were answered. The patient knows to call the clinic with any problems, questions or concerns. I spent 20 minutes counseling the patient face to face. The total time spent in the appointment was 25 minutes and more than 50% was on counseling.     Joshua [name] 05/21/2017   This document serves as a record of services personally performed by Joshua [name]. It was created on her behalf by Joslyn Devon, a trained medical scribe. The creation of this record is based on the scribe's personal observations and the provider's statements to them.    I have reviewed the above documentation for accuracy and completeness, and I agree with the above.

## 2017-05-21 ENCOUNTER — Ambulatory Visit: Payer: BLUE CROSS/BLUE SHIELD | Admitting: Hematology

## 2017-05-21 ENCOUNTER — Inpatient Hospital Stay: Payer: BLUE CROSS/BLUE SHIELD | Attending: Hematology | Admitting: Hematology

## 2017-05-21 VITALS — BP 143/83 | HR 89 | Temp 98.5°F | Resp 18 | Ht 71.0 in | Wt 153.0 lb

## 2017-05-21 DIAGNOSIS — C220 Liver cell carcinoma: Secondary | ICD-10-CM | POA: Insufficient documentation

## 2017-05-21 DIAGNOSIS — K7030 Alcoholic cirrhosis of liver without ascites: Secondary | ICD-10-CM | POA: Diagnosis not present

## 2017-05-21 DIAGNOSIS — I8500 Esophageal varices without bleeding: Secondary | ICD-10-CM | POA: Diagnosis not present

## 2017-05-21 DIAGNOSIS — Z79899 Other long term (current) drug therapy: Secondary | ICD-10-CM | POA: Diagnosis not present

## 2017-05-21 DIAGNOSIS — F17210 Nicotine dependence, cigarettes, uncomplicated: Secondary | ICD-10-CM | POA: Diagnosis not present

## 2017-05-21 DIAGNOSIS — I700 Atherosclerosis of aorta: Secondary | ICD-10-CM | POA: Insufficient documentation

## 2017-05-21 DIAGNOSIS — B182 Chronic viral hepatitis C: Secondary | ICD-10-CM | POA: Diagnosis not present

## 2017-05-21 DIAGNOSIS — K740 Hepatic fibrosis: Secondary | ICD-10-CM | POA: Insufficient documentation

## 2017-05-21 DIAGNOSIS — K766 Portal hypertension: Secondary | ICD-10-CM | POA: Insufficient documentation

## 2017-05-22 ENCOUNTER — Encounter: Payer: Self-pay | Admitting: Hematology

## 2017-05-29 ENCOUNTER — Ambulatory Visit (HOSPITAL_COMMUNITY)
Admission: RE | Admit: 2017-05-29 | Discharge: 2017-05-29 | Disposition: A | Discharge status: Home / Self Care | Payer: BLUE CROSS/BLUE SHIELD | Source: Ambulatory Visit | Attending: Hematology | Admitting: Hematology

## 2017-05-29 ENCOUNTER — Encounter (HOSPITAL_COMMUNITY): Payer: Self-pay

## 2017-05-29 DIAGNOSIS — I700 Atherosclerosis of aorta: Secondary | ICD-10-CM | POA: Insufficient documentation

## 2017-05-29 DIAGNOSIS — C220 Liver cell carcinoma: Secondary | ICD-10-CM | POA: Insufficient documentation

## 2017-05-29 DIAGNOSIS — R911 Solitary pulmonary nodule: Secondary | ICD-10-CM | POA: Diagnosis not present

## 2017-06-10 ENCOUNTER — Other Ambulatory Visit (HOSPITAL_COMMUNITY): Payer: Self-pay | Admitting: Interventional Radiology

## 2017-06-10 ENCOUNTER — Telehealth: Payer: Self-pay | Admitting: Hematology

## 2017-06-10 DIAGNOSIS — C220 Liver cell carcinoma: Secondary | ICD-10-CM

## 2017-06-10 NOTE — Telephone Encounter (Signed)
I spoke with Dr. Pascal Lux about his next treatment, and we agree to proceed with Y-90 to his right lobe liver next. Dr. Pascal Lux will see him back and set up his treatment soon. I spoke with pt today and he also agrees with the recommendation. I told him his recent CT chest was negative for metastasis. I will see him back in 4-5 months.   Truitt Merle  06/10/2017

## 2017-06-11 ENCOUNTER — Telehealth: Payer: Self-pay | Admitting: Hematology

## 2017-06-11 NOTE — Telephone Encounter (Signed)
Spoke with patient regarding appointment per 1/28 sched messge

## 2017-06-13 ENCOUNTER — Encounter: Payer: Self-pay | Admitting: Radiology

## 2017-06-13 ENCOUNTER — Ambulatory Visit
Admission: RE | Admit: 2017-06-13 | Discharge: 2017-06-13 | Disposition: A | Discharge status: Home / Self Care | Payer: BLUE CROSS/BLUE SHIELD | Source: Ambulatory Visit | Attending: Interventional Radiology | Admitting: Interventional Radiology

## 2017-06-13 DIAGNOSIS — C220 Liver cell carcinoma: Secondary | ICD-10-CM

## 2017-06-13 HISTORY — PX: IR RADIOLOGIST EVAL & MGMT: IMG5224

## 2017-06-13 NOTE — Progress Notes (Signed)
Patient ID: Joshua [name], male   DOB: 1956-01-04, 62 y.o.   MRN: [id number]         Chief Complaint: Progressive multifocal [place]  Referring Physician(s): Drazek (GI) Burr Medico (Oncology) Burnice Logan (PCP)  History of Present Illness: Joshua [name] is a 62 y.o. male with past medical history significant for hypertension, smoking, hepatitis C and alcoholic cirrhosis and multifocal HCC who underwent bland embolization of (at the time) both hepatic lesions on 09/13/2016 followed by ultrasound and CT-guided microwave ablation on 10/05/2016. Initial surveillance abdominal MRI performed 01/10/2017 demonstrated a technically excellent result without evidence of residual disease or new area of [place], however subsequent abdominal MRI performed 04/26/2017 demonstrates development of new multifocal hepatocellular carcinoma throughout predominantly the right lobe of the liver.   The patient was initially seen in consultation on 05/16/2016 over was referred to Dr. Burr Medico (Oncology) for consideration of systemic treatment.   After evaluation, the Dr. Burr Medico feels the patient could benefit from continued hepatic directed therapy and as such, patient returns to the interventional radiology clinic for discussion of appropriateness of Y 90 radioembolization.  The patient is again accompanied by his mother and father-in-law though serves as his own historian.  Patient admits to fatigue those otherwise largely without complaint. No change in appetite. No unintentional weight loss. No yellowing of the skin or eyes. No increased abdominal girth.  Patient complains of transient right groin pain which she states he experienced after lifting some furniture several weeks ago, thought states this has largely resolved.   Past Medical History:  Diagnosis Date  . Cancer ([place])   . Cataract   . Chronic alcoholism ([place])   . Cirrhosis ([place])   . Depression   . Glucose intolerance (impaired glucose tolerance)   . Hepatitis C   .  Hypertension   . Tobacco use   . Umbilical hernia     Past Surgical History:  Procedure Laterality Date  . CATARACT EXTRACTION    . COLONOSCOPY  12/09  . INGUINAL HERNIA REPAIR    . IR 3D INDEPENDENT WKST  09/13/2016  . IR [place] ADDITIONAL VESSEL  09/13/2016  . IR [place] ADDITIONAL VESSEL  09/13/2016  . IR [place] ADDITIONAL VESSEL  09/13/2016  . IR [place] ADDITIONAL VESSEL  09/13/2016  . IR [place] ADDITIONAL VESSEL  09/13/2016  . IR ANGIOGRAM VISCERAL SELECTIVE  09/13/2016  . IR EMBO TUMOR ORGAN ISCHEMIA INFARCT INC GUIDE ROADMAPPING  09/13/2016  . IR RADIOLOGIST EVAL & MGMT  08/21/2016  . IR RADIOLOGIST EVAL & MGMT  08/30/2016  . IR RADIOLOGIST EVAL & MGMT  09/26/2016  . IR RADIOLOGIST EVAL & MGMT  01/10/2017  . IR RADIOLOGIST EVAL & MGMT  11/06/2016  . IR RADIOLOGIST EVAL & MGMT  05/16/2017  . IR RADIOLOGIST EVAL & MGMT  06/13/2017  . IR US GUIDE VASC ACCESS RIGHT  09/13/2016  . left orchiectomy  07/2009  . RADIOFREQUENCY ABLATION N/A 10/05/2016   Procedure: LIVER MICROWAVE THERMAL ABLATION;  Surgeon: Sandi Mariscal, MD;  Location: [facility];  Service: Anesthesiology;  Laterality: N/A;  . UMBILICAL HERNIA REPAIR      Allergies: Patient has no known allergies.  Medications: Prior to Admission medications   Medication Sig Start Date End Date Taking? Authorizing Provider  acetaminophen (TYLENOL) 325 MG tablet Take 650 mg by mouth every 6 (six) hours as needed (for headaches.).   Yes [provider]  b complex vitamins capsule Take 1  capsule by mouth daily.   Yes [provider]  lisinopril-hydrochlorothiazide (PRINZIDE,ZESTORETIC) 10-12.5 MG tablet Take 1 tablet by mouth daily. 02/28/17  Yes Marletta Lor, MD  Multiple Vitamin (MULTIVITAMIN WITH MINERALS) TABS tablet Take 1 tablet by mouth daily.   Yes [provider]  sertraline (ZOLOFT) 100 MG tablet TAKE 1 TABLET BY MOUTH EVERY DAY  04/30/17  Yes Marletta Lor, MD  traZODone (DESYREL) 50 MG tablet TAKE 2 TABLETS BY MOUTH EVERY NIGHT AT BEDTIME 03/01/17  Yes Marletta Lor, MD  lisinopril-hydrochlorothiazide (PRINZIDE,ZESTORETIC) 10-12.5 MG tablet TAKE 1 TABLET BY MOUTH EVERY DAY 03/01/17   Marletta Lor, MD     Family History  Problem Relation Age of Onset  . Healthy Mother   . Colon cancer Neg Hx   . Stomach cancer Neg Hx   . Esophageal cancer Neg Hx   . Rectal cancer Neg Hx     Social History   Socioeconomic History  . Marital status: Single    Spouse name: Not on file  . Number of children: Not on file  . Years of education: Not on file  . Highest education level: Not on file  Social Needs  . Financial resource strain: Not on file  . Food insecurity - worry: Not on file  . Food insecurity - inability: Not on file  . Transportation needs - medical: Not on file  . Transportation needs - non-medical: Not on file  Occupational History  . Not on file  Tobacco Use  . Smoking status: Current Every Day Smoker    Packs/day: 0.50    Years: 42.00    Pack years: 21.00    Types: Cigarettes    Start date: 05/21/1971  . Smokeless tobacco: Former Systems developer  . Tobacco comment: Has cut down to about 1/3 pack per day  Substance and Sexual Activity  . Alcohol use: No    Alcohol/week: 16.8 oz    Types: 28 Cans of beer per week    Comment: hx of nothing current   . Drug use: No  . Sexual activity: Not on file  Other Topics Concern  . Not on file  Social History Narrative  . Not on file    ECOG Status: 1 - Symptomatic but completely ambulatory  Review of Systems: A 12 point ROS discussed and pertinent positives are indicated in the HPI above.  All other systems are negative.  Review of Systems  Constitutional: Positive for activity change. Negative for appetite change.  HENT: Negative.   Respiratory: Negative.   Cardiovascular: Negative.   Gastrointestinal: Negative.  Negative for abdominal  distention.  Skin: Negative for color change.  Psychiatric/Behavioral: Negative for confusion.    Vital Signs: BP 115/75   Pulse 74   Temp 98.2 F (36.8 C) (Oral)   Resp 14   Ht 5\' 11"  (1.803 m)   Wt 150 lb (68 kg)   SpO2 99%   BMI 20.92 kg/m   Physical Exam  Mallampati Score:     Imaging: Ct Chest Wo Contrast  Result Date: 05/30/2017 CLINICAL DATA:  Evaluate for liver Mets. Surgery with Yttrium 90 radio embolization. Hepatocellular carcinoma. EXAM: CT CHEST WITHOUT CONTRAST TECHNIQUE: Multidetector CT imaging of the chest was performed following the standard protocol without IV contrast. COMPARISON:  CT chest 10/20/2014 FINDINGS: Cardiovascular: Coronary artery calcification and aortic atherosclerotic calcification. Mediastinum/Nodes: No axillary supraclavicular adenopathy. No mediastinal hilar adenopathy Lungs/Pleura: RIGHT upper lobe ground-glass nodule measures 7 mm (image 61, series 7) which  is increased from 4 mm on 10/20/2014. Solid nodule measuring 4 mm RIGHT middle lobe (image VII, series 7) is unchanged comparison exam. Solid and ground-glass nodule in the RIGHT lower lobe measures 4 mm (image 93, series 7) is increased in the solid component compared to prior exam. Upper Abdomen: Liver has a nodular contour with enlargement of the caudate lobe. Adrenal glands normal Musculoskeletal: No aggressive osseous lesion. IMPRESSION: 1. Slow enlargement of RIGHT upper lobe ground-glass nodule. Recommend follow-up CT without contrast 12 months. 2. Mild increase in prominence of 4 mm solid and semi-solid nodule in the RIGHT lower lobe. Recommend follow-up in 12 months as above. Pulmonary nodules not favored metastatic but primary bronchogenic adenocarcinoma cannot be excluded. Recommendations for the Management of Subsolid Pulmonary Nodules Detected at CT: A Statement from the [facility] 2013; 266:1, [phone]. 3. Nodular cirrhotic liver treatment sites noted Aortic  Atherosclerosis (ICD10-I70.0). Electronically Signed   By: Suzy Bouchard M.D.   On: 05/30/2017 09:48   Ir Radiologist Eval & Mgmt  Result Date: 06/13/2017 Please refer to notes tab for details about interventional procedure. (Op Note)  Ir Radiologist Eval & Mgmt  Result Date: 05/16/2017 Please refer to notes tab for details about interventional procedure. (Op Note)   Labs:  CBC: Recent Labs    09/13/16 0942 10/04/16 1000 10/06/16 0435 01/04/17 1050  WBC 10.7* 11.3* 19.2* 10.3  HGB 14.3 14.4 12.3* 12.7*  HCT 40.8 41.0 36.0* 38.2*  PLT 254 254 199 265    COAGS: Recent Labs    08/03/16 1221 09/13/16 0942 10/04/16 1000 01/04/17 1050  INR 1.0 1.07 1.05 1.0  APTT  --  29  --   --     BMP: Recent Labs    10/06/16 0435 11/01/16 1034 01/04/17 1050 04/19/17 1436 05/16/17 1129  NA 139 139 137  --  135  K 3.0* 3.6 4.2  --  3.5  CL 102 102 101  --  98  CO2 29 24 25   --  25  GLUCOSE 119* 115* 85  --  111  BUN 10 11 7 7 7   CALCIUM 8.0* 8.8 8.4*  --  9.3  CREATININE 0.50* 0.59* 0.47* 0.61* 0.63*  GFRNONAA >60 >89 >89 108 106  GFRAA >60 >89 >89 125 123    LIVER FUNCTION TESTS: Recent Labs    10/04/16 1000 10/06/16 0435 11/01/16 1034 01/04/17 1050 05/16/17 1129  BILITOT 0.9 1.3* 0.7 0.5 0.6  AST 73* 273* 58* 64* 84*  ALT 36 76* 31 25 34  ALKPHOS 155* 125 239* 215*  --   PROT 7.7 6.5 7.3 7.3 7.2  ALBUMIN 3.6 3.0* 3.4* 3.4*  --     TUMOR MARKERS: Recent Labs    06/28/16 1012 09/24/16 0959 04/19/17 1436  AFPTM 5.3 5.2 8.7*    Assessment and Plan:  Joshua [name] is a 62 y.o. male with past medical history significant for hypertension, smoking, hepatitis C and alcoholic cirrhosis and multifocal HCC who underwent bland embolization of (at the time) both hepatic lesions on 09/13/2016 followed by ultrasound and CT-guided microwave ablation on 10/05/2016. Initial surveillance abdominal MRI performed 01/10/2017 demonstrated a technically excellent result without  evidence of residual disease or new area of [place], however subsequent abdominal MRI performed 04/26/2017 demonstrates development of new multifocal hepatocellular carcinoma throughout predominantly the right lobe of the liver.   As Dr. Burr Medico feels the patient could benefit from continued hepatic directed therapy, prolonged conversations were held with the patient regarding Y  90 radioembolization.  I explained that transcatheter procedures (in particular, Y-90 radioembolization) are performed for palliative purposes with the hope of achieving disease stability to improvement for 3-6 months following the embolization.  I explained that Y 90 radioembolization will entail to potentially 3 procedures with the first procedure being the mapping/safety procedure.  I will then proceed with definitive radiation embolization of the right lobe the liver as that is the location of the dominant source of the patient's disease.  Following approximately 4-6 weeks, I will consider potential radial embolization of the left lobe of the liver but this will be delayed secondary to the advanced morphologic changes of cirrhosis with liver and to ensure adequate hepatic reserve and recovery.  I explained that risks associated with the Y-90 procedures include but are not limited to bleeding, nontarget radioactivity administration, worsening hepatic and/or renal function, contrast and radiation exposure.  Following this prolonged and detailed conversation, the patient wishes to pursue Y 50 radioembolization.  As such, I will obtain a hepatic protocol CTA of the abdomen and pelvis to better delineate hepatic vascular culture prior to the Y 90 radioembolization.  Following insurance approval, the Y 90 radioembolization procedures will be performed at outpatient basis at [facility].    The patient was encouraged to call the interventional radiology clinic with any interval questions or concerns.  Thank you for this  interesting consult.  I greatly enjoyed meeting Joshua [name] and look forward to participating in their care.  A copy of this report was sent to the requesting provider on this date.  Electronically Signed: Sandi Mariscal 06/13/2017, 4:57 PM   I spent a total of 25 Minutes in face to face in clinical consultation, greater than 50% of which was counseling/coordinating care for percutaneous management of [place].

## 2017-06-14 ENCOUNTER — Other Ambulatory Visit (HOSPITAL_COMMUNITY): Payer: Self-pay | Admitting: Interventional Radiology

## 2017-06-14 DIAGNOSIS — C220 Liver cell carcinoma: Secondary | ICD-10-CM

## 2017-06-17 ENCOUNTER — Ambulatory Visit (HOSPITAL_COMMUNITY)
Admission: RE | Admit: 2017-06-17 | Discharge: 2017-06-17 | Disposition: A | Discharge status: Home / Self Care | Payer: BLUE CROSS/BLUE SHIELD | Source: Ambulatory Visit | Attending: Interventional Radiology | Admitting: Interventional Radiology

## 2017-06-17 DIAGNOSIS — C220 Liver cell carcinoma: Secondary | ICD-10-CM | POA: Insufficient documentation

## 2017-06-17 DIAGNOSIS — I708 Atherosclerosis of other arteries: Secondary | ICD-10-CM | POA: Diagnosis not present

## 2017-06-17 DIAGNOSIS — K7460 Unspecified cirrhosis of liver: Secondary | ICD-10-CM | POA: Diagnosis not present

## 2017-06-17 DIAGNOSIS — I701 Atherosclerosis of renal artery: Secondary | ICD-10-CM | POA: Diagnosis not present

## 2017-06-17 DIAGNOSIS — R188 Other ascites: Secondary | ICD-10-CM | POA: Insufficient documentation

## 2017-06-17 MED ORDER — IOPAMIDOL (ISOVUE-370) INJECTION 76%
INTRAVENOUS | Status: AC
  Filled 2017-06-17: qty 100

## 2017-06-17 MED ORDER — IOPAMIDOL (ISOVUE-370) INJECTION 76%
100.0000 mL | Freq: Once | INTRAVENOUS | Status: AC | PRN
  Administered 2017-06-17: 100 mL via INTRAVENOUS

## 2017-06-18 ENCOUNTER — Other Ambulatory Visit (HOSPITAL_COMMUNITY): Payer: Self-pay | Admitting: Interventional Radiology

## 2017-06-18 DIAGNOSIS — C220 Liver cell carcinoma: Secondary | ICD-10-CM

## 2017-06-26 ENCOUNTER — Other Ambulatory Visit: Payer: Self-pay | Admitting: Radiology

## 2017-06-26 ENCOUNTER — Other Ambulatory Visit: Payer: Self-pay | Admitting: General Surgery

## 2017-06-27 ENCOUNTER — Encounter (HOSPITAL_COMMUNITY)
Admission: RE | Admit: 2017-06-27 | Discharge: 2017-06-27 | Disposition: A | Discharge status: Home / Self Care | Payer: BLUE CROSS/BLUE SHIELD | Source: Ambulatory Visit | Attending: Interventional Radiology | Admitting: Interventional Radiology

## 2017-06-27 ENCOUNTER — Ambulatory Visit (HOSPITAL_COMMUNITY)
Admission: RE | Admit: 2017-06-27 | Discharge: 2017-06-27 | Disposition: A | Discharge status: Home / Self Care | Payer: BLUE CROSS/BLUE SHIELD | Source: Ambulatory Visit | Attending: Interventional Radiology | Admitting: Interventional Radiology

## 2017-06-27 ENCOUNTER — Other Ambulatory Visit (HOSPITAL_COMMUNITY): Payer: Self-pay | Admitting: Interventional Radiology

## 2017-06-27 ENCOUNTER — Encounter (HOSPITAL_COMMUNITY): Payer: Self-pay

## 2017-06-27 DIAGNOSIS — C220 Liver cell carcinoma: Secondary | ICD-10-CM | POA: Insufficient documentation

## 2017-06-27 DIAGNOSIS — R7302 Impaired glucose tolerance (oral): Secondary | ICD-10-CM | POA: Diagnosis not present

## 2017-06-27 DIAGNOSIS — F329 Major depressive disorder, single episode, unspecified: Secondary | ICD-10-CM | POA: Insufficient documentation

## 2017-06-27 DIAGNOSIS — B1920 Unspecified viral hepatitis C without hepatic coma: Secondary | ICD-10-CM | POA: Diagnosis not present

## 2017-06-27 DIAGNOSIS — I10 Essential (primary) hypertension: Secondary | ICD-10-CM | POA: Insufficient documentation

## 2017-06-27 DIAGNOSIS — K7030 Alcoholic cirrhosis of liver without ascites: Secondary | ICD-10-CM | POA: Diagnosis not present

## 2017-06-27 HISTORY — PX: IR ANGIOGRAM SELECTIVE EACH ADDITIONAL VESSEL: IMG667

## 2017-06-27 HISTORY — PX: IR US GUIDE VASC ACCESS RIGHT: IMG2390

## 2017-06-27 HISTORY — PX: IR ANGIOGRAM VISCERAL SELECTIVE: IMG657

## 2017-06-27 LAB — COMPREHENSIVE METABOLIC PANEL
ALT: 44 U/L (ref 17–63)
AST: 117 U/L — AB (ref 15–41)
Albumin: 3.5 g/dL (ref 3.5–5.0)
Alkaline Phosphatase: 244 U/L — ABNORMAL HIGH (ref 38–126)
Anion gap: 11 (ref 5–15)
BILIRUBIN TOTAL: 1 mg/dL (ref 0.3–1.2)
BUN: 10 mg/dL (ref 6–20)
CHLORIDE: 104 mmol/L (ref 101–111)
CO2: 23 mmol/L (ref 22–32)
Calcium: 8.8 mg/dL — ABNORMAL LOW (ref 8.9–10.3)
Creatinine, Ser: 0.59 mg/dL — ABNORMAL LOW (ref 0.61–1.24)
Glucose, Bld: 117 mg/dL — ABNORMAL HIGH (ref 65–99)
POTASSIUM: 3.7 mmol/L (ref 3.5–5.1)
Sodium: 138 mmol/L (ref 135–145)
TOTAL PROTEIN: 7.8 g/dL (ref 6.5–8.1)

## 2017-06-27 LAB — CBC WITH DIFFERENTIAL/PLATELET
BASOS ABS: 0.1 10*3/uL (ref 0.0–0.1)
Basophils Relative: 1 %
EOS PCT: 3 %
Eosinophils Absolute: 0.3 10*3/uL (ref 0.0–0.7)
HEMATOCRIT: 39.9 % (ref 39.0–52.0)
Hemoglobin: 13.6 g/dL (ref 13.0–17.0)
LYMPHS ABS: 1.3 10*3/uL (ref 0.7–4.0)
Lymphocytes Relative: 12 %
MCH: 33.5 pg (ref 26.0–34.0)
MCHC: 34.1 g/dL (ref 30.0–36.0)
MCV: 98.3 fL (ref 78.0–100.0)
MONOS PCT: 19 %
Monocytes Absolute: 2 10*3/uL — ABNORMAL HIGH (ref 0.1–1.0)
NEUTROS ABS: 6.7 10*3/uL (ref 1.7–7.7)
Neutrophils Relative %: 65 %
PLATELETS: 245 10*3/uL (ref 150–400)
RBC: 4.06 MIL/uL — ABNORMAL LOW (ref 4.22–5.81)
RDW: 16.9 % — AB (ref 11.5–15.5)
WBC: 10.2 10*3/uL (ref 4.0–10.5)

## 2017-06-27 LAB — PROTIME-INR
INR: 1.03
PROTHROMBIN TIME: 13.4 s (ref 11.4–15.2)

## 2017-06-27 MED ORDER — IOPAMIDOL (ISOVUE-300) INJECTION 61%
INTRAVENOUS | Status: AC
  Filled 2017-06-27: qty 300

## 2017-06-27 MED ORDER — LIDOCAINE HCL 1 % IJ SOLN
INTRAMUSCULAR | Status: AC
  Filled 2017-06-27: qty 20

## 2017-06-27 MED ORDER — IOPAMIDOL (ISOVUE-300) INJECTION 61%
85.0000 mL | Freq: Once | INTRAVENOUS | Status: AC | PRN
  Administered 2017-06-27: 85 mL via INTRAVENOUS

## 2017-06-27 MED ORDER — TECHNETIUM TO 99M ALBUMIN AGGREGATED
5.0000 | Freq: Once | INTRAVENOUS | Status: AC | PRN
  Administered 2017-06-27: 5 via INTRAVENOUS

## 2017-06-27 MED ORDER — SODIUM CHLORIDE 0.9 % IV SOLN
INTRAVENOUS | Status: DC
  Administered 2017-06-27: 08:00:00 via INTRAVENOUS

## 2017-06-27 MED ORDER — MIDAZOLAM HCL 2 MG/2ML IJ SOLN
INTRAMUSCULAR | Status: AC | PRN
  Administered 2017-06-27 (×2): 0.5 mg via INTRAVENOUS
  Administered 2017-06-27: 1 mg via INTRAVENOUS
  Administered 2017-06-27: 0.5 mg via INTRAVENOUS

## 2017-06-27 MED ORDER — FENTANYL CITRATE (PF) 100 MCG/2ML IJ SOLN
INTRAMUSCULAR | Status: AC | PRN
  Administered 2017-06-27: 50 ug via INTRAVENOUS
  Administered 2017-06-27: 25 ug via INTRAVENOUS
  Administered 2017-06-27 (×2): 50 ug via INTRAVENOUS

## 2017-06-27 MED ORDER — TECHNETIUM TO 99M ALBUMIN AGGREGATED: 5.0000 | Freq: Once | INTRAVENOUS | Status: DC | PRN

## 2017-06-27 MED ORDER — LIDOCAINE HCL 1 % IJ SOLN
INTRAMUSCULAR | Status: AC | PRN
  Administered 2017-06-27: 5 mL

## 2017-06-27 MED ORDER — CEFAZOLIN SODIUM-DEXTROSE 2-4 GM/100ML-% IV SOLN
INTRAVENOUS | Status: AC
  Filled 2017-06-27: qty 100

## 2017-06-27 MED ORDER — FENTANYL CITRATE (PF) 100 MCG/2ML IJ SOLN
25.0000 ug | Freq: Once | INTRAMUSCULAR | Status: AC
  Administered 2017-06-27: 25 ug via INTRAVENOUS
  Filled 2017-06-27: qty 2

## 2017-06-27 MED ORDER — MIDAZOLAM HCL 2 MG/2ML IJ SOLN
INTRAMUSCULAR | Status: AC
  Filled 2017-06-27: qty 6

## 2017-06-27 MED ORDER — FENTANYL CITRATE (PF) 100 MCG/2ML IJ SOLN
INTRAMUSCULAR | Status: AC
  Administered 2017-06-27: 25 ug via INTRAVENOUS
  Filled 2017-06-27: qty 6

## 2017-06-27 NOTE — Sedation Documentation (Signed)
Patient is resting comfortably. Pt waking more frequently and stirring on table. Medicated with additional versed/fentanyl

## 2017-06-27 NOTE — Sedation Documentation (Signed)
In NM at this time. Unable to assess groin site during NM imaging and for the duration of the NM imaging

## 2017-06-27 NOTE — Discharge Instructions (Signed)
Post Y-90 Radioembolization Discharge Instructions ° °You have been given a radioactive material during your procedure.  While it is safe for you to be discharged home from the hospital, you need to proceed directly home.   ° °Do not use public transportation, including air travel, lasting more than 2 hours for 1 week. ° °Avoid crowded public places for 1 week. ° °Adult visitors should try to avoid close contact with you for 1 week.   ° °Children and pregnant females should not visit or have close contact with you for 1 week. ° °Items that you touch are not radioactive. ° °Do not sleep in the same bed as your partner for 1 week, and a condom should be used for sexual activity during the first 24 hours. ° °Your blood may be radioactive and caution should be used if any bleeding occurs during the recovery period. ° °Body fluids may be radioactive for 24 hours.  Wash your hands after voiding.  Men should sit to urinate.  Dispose of any soiled materials (flush down toilet or place in trash at home) during the first day. ° °Drink 6 to 8 glasses of fluids per day for 5 days to hydrate yourself. ° °If you need to see a doctor during the first week, you must let them know that you were treated with yttrium-90 microspheres, and will be slightly radioactive.  They can call Interventional Radiology [phone] with any questions.Moderate Conscious Sedation, Adult, Care After °These instructions provide you with information about caring for yourself after your procedure. Your health care provider may also give you more specific instructions. Your treatment has been planned according to current medical practices, but problems sometimes occur. Call your health care provider if you have any problems or questions after your procedure. °What can I expect after the procedure? °After your procedure, it is common: °· To feel sleepy for several hours. °· To feel clumsy and have poor balance for several hours. °· To have poor judgment for  several hours. °· To vomit if you eat too soon. ° °Follow these instructions at home: °For at least 24 hours after the procedure: ° °· Do not: °? Participate in activities where you could fall or become injured. °? Drive. °? Use heavy machinery. °? Drink alcohol. °? Take sleeping pills or medicines that cause drowsiness. °? Make important decisions or sign legal documents. °? Take care of children on your own. °· Rest. °Eating and drinking °· Follow the diet recommended by your health care provider. °· If you vomit: °? Drink water, juice, or soup when you can drink without vomiting. °? Make sure you have little or no nausea before eating solid foods. °General instructions °· Have a responsible adult stay with you until you are awake and alert. °· Take over-the-counter and prescription medicines only as told by your health care provider. °· If you smoke, do not smoke without supervision. °· Keep all follow-up visits as told by your health care provider. This is important. °Contact a health care provider if: °· You keep feeling nauseous or you keep vomiting. °· You feel light-headed. °· You develop a rash. °· You have a fever. °Get help right away if: °· You have trouble breathing. °This information is not intended to replace advice given to you by your health care provider. Make sure you discuss any questions you have with your health care provider. °Document Released: 02/18/2013 Document Revised: 10/03/2015 Document Reviewed: 08/20/2015 °Elsevier Interactive Patient Education © 2018 [employer] Inc[employer] ° °

## 2017-06-27 NOTE — Sedation Documentation (Signed)
Patient is resting comfortably. Becoming more fidgety on table and moving more frequently. Medicated with half doses of versed/fentanyl d/t SBP in 90's

## 2017-06-27 NOTE — Sedation Documentation (Signed)
Patient is resting comfortably with eyes closed in NAD. 

## 2017-06-27 NOTE — Sedation Documentation (Signed)
Patient is resting comfortably and audibly snoring at this time

## 2017-06-27 NOTE — Procedures (Signed)
Pre-procedure Diagnosis: Multifocal HCC Post-procedure Diagnosis: Same  Post pre Y-90 mapping.    Complications: None Immediate  EBL: None  Keep right leg straight for 4 hrs.    SignedSandi Mariscal Pager: [phone] 06/27/2017, 11:36 AM

## 2017-06-27 NOTE — H&P (Signed)
Referring Physician(s): Feng,Y/Drazek,D,NP  Supervising Physician: Sandi Mariscal  Patient Status:  [facility]  Chief Complaint: Hepatocellular carcinoma   Subjective: Patient familiar to IR service from prior bland embolization of segments 6 and 8 hepatocellular carcinomas on 09/13/16 followed by thermal ablation on 10/05/16.  He has a history of multifocal HCC, cirrhosis and hepatitis C with recent imaging showing progressive hepatic disease, predominantly in the right lobe.  Following recent discussions with Dr. Pascal Lux he was deemed an appropriate candidate for Y 90 hepatic radioembolization and presents today for arterial roadmapping/embolization/test Y-90 dosing session.He currently denies fever, headache, chest pain, dyspnea, cough, back pain, nausea, vomiting or bleeding.  He does have some mild abdominal discomfort. Past Medical History:  Diagnosis Date  . Cancer ([place])   . Cataract   . Chronic alcoholism ([place])   . Cirrhosis ([place])   . Depression   . Glucose intolerance (impaired glucose tolerance)   . Hepatitis C   . Hypertension   . Tobacco use   . Umbilical hernia    Past Surgical History:  Procedure Laterality Date  . CATARACT EXTRACTION    . COLONOSCOPY  12/09  . INGUINAL HERNIA REPAIR    . IR 3D INDEPENDENT WKST  09/13/2016  . IR [place] ADDITIONAL VESSEL  09/13/2016  . IR [place] ADDITIONAL VESSEL  09/13/2016  . IR [place] ADDITIONAL VESSEL  09/13/2016  . IR [place] ADDITIONAL VESSEL  09/13/2016  . IR [place] ADDITIONAL VESSEL  09/13/2016  . IR ANGIOGRAM VISCERAL SELECTIVE  09/13/2016  . IR EMBO TUMOR ORGAN ISCHEMIA INFARCT INC GUIDE ROADMAPPING  09/13/2016  . IR RADIOLOGIST EVAL & MGMT  08/21/2016  . IR RADIOLOGIST EVAL & MGMT  08/30/2016  . IR RADIOLOGIST EVAL & MGMT  09/26/2016  . IR RADIOLOGIST EVAL & MGMT  01/10/2017  . IR RADIOLOGIST EVAL & MGMT  11/06/2016  . IR RADIOLOGIST EVAL & MGMT  05/16/2017    . IR RADIOLOGIST EVAL & MGMT  06/13/2017  . IR US GUIDE VASC ACCESS RIGHT  09/13/2016  . left orchiectomy  07/2009  . RADIOFREQUENCY ABLATION N/A 10/05/2016   Procedure: LIVER MICROWAVE THERMAL ABLATION;  Surgeon: Sandi Mariscal, MD;  Location: [facility];  Service: Anesthesiology;  Laterality: N/A;  . UMBILICAL HERNIA REPAIR       Allergies: Patient has no known allergies.  Medications: Prior to Admission medications   Medication Sig Start Date End Date Taking? Authorizing Provider  acetaminophen (TYLENOL) 325 MG tablet Take 650 mg by mouth every 6 (six) hours as needed (for headaches.).   Yes [provider]  b complex vitamins capsule Take 1 capsule by mouth daily.   Yes [provider]  lisinopril-hydrochlorothiazide (PRINZIDE,ZESTORETIC) 10-12.5 MG tablet Take 1 tablet by mouth daily. 02/28/17  Yes Marletta Lor, MD  Multiple Vitamin (MULTIVITAMIN WITH MINERALS) TABS tablet Take 1 tablet by mouth daily.   Yes [provider]  sertraline (ZOLOFT) 100 MG tablet TAKE 1 TABLET BY MOUTH EVERY DAY 04/30/17  Yes Marletta Lor, MD  traZODone (DESYREL) 50 MG tablet TAKE 2 TABLETS BY MOUTH EVERY NIGHT AT BEDTIME 03/01/17  Yes Marletta Lor, MD     Vital Signs: BP (!) 169/85 (BP Location: Right Arm)   Pulse 80   Temp 98.3 F (36.8 C) (Oral)   Resp 16   SpO2 99%   Physical Exam awake, alert.  Chest with distant breath sounds bilaterally.  Heart with regular  rate and rhythm.  Abdomen soft, positive bowel sounds, mild central abdominal tenderness to palpation; 1+ bilateral lower extremity edema.  Imaging: No results found.  Labs:  CBC: Recent Labs    10/04/16 1000 10/06/16 0435 01/04/17 1050 06/27/17 0745  WBC 11.3* 19.2* 10.3 10.2  HGB 14.4 12.3* 12.7* 13.6  HCT 41.0 36.0* 38.2* 39.9  PLT 254 199 265 245    COAGS: Recent Labs    09/13/16 0942 10/04/16 1000 01/04/17 1050 06/27/17 0745  INR 1.07 1.05 1.0 1.03  APTT 29  --   --    --     BMP: Recent Labs    11/01/16 1034 01/04/17 1050 04/19/17 1436 05/16/17 1129 06/27/17 0745  NA 139 137  --  135 138  K 3.6 4.2  --  3.5 3.7  CL 102 101  --  98 104  CO2 24 25  --  25 23  GLUCOSE 115* 85  --  111 117*  BUN 11 7 7 7 10   CALCIUM 8.8 8.4*  --  9.3 8.8*  CREATININE 0.59* 0.47* 0.61* 0.63* 0.59*  GFRNONAA >89 >89 108 106 >60  GFRAA >89 >89 125 123 >60    LIVER FUNCTION TESTS: Recent Labs    10/06/16 0435 11/01/16 1034 01/04/17 1050 05/16/17 1129 06/27/17 0745  BILITOT 1.3* 0.7 0.5 0.6 1.0  AST 273* 58* 64* 84* 117*  ALT 76* 31 25 34 44  ALKPHOS 125 239* 215*  --  244*  PROT 6.5 7.3 7.3 7.2 7.8  ALBUMIN 3.0* 3.4* 3.4*  --  3.5    Assessment and Plan: Patient with history of cirrhosis, hepatitis C, multifocal HCC with prior bland arterial embolization as well as thermal ablation in May 2018.  He now presents with progressive hepatic disease, predominantly in right lobe.  He was deemed an appropriate candidate for Y 90 hepatic radioembolization and presents today for arterial roadmapping/embolization/test y-90 dosing.Risks and benefits of procedure were discussed with the patient including, but not limited to bleeding, infection, vascular injury or contrast induced renal failure.  This interventional procedure involves the use of X-rays and because of the nature of the planned procedure, it is possible that we will have prolonged use of X-ray fluoroscopy.  Potential radiation risks to you include (but are not limited to) the following: - A slightly elevated risk for cancer  several years later in life. This risk is typically less than 0.5% percent. This risk is low in comparison to the normal incidence of human cancer, which is 33% for women and 50% for men according to the [place]. - Radiation induced injury can include skin redness, resembling a rash, tissue breakdown / ulcers and hair loss (which can be temporary or permanent).   The  likelihood of either of these occurring depends on the difficulty of the procedure and whether you are sensitive to radiation due to previous procedures, disease, or genetic conditions.   IF your procedure requires a prolonged use of radiation, you will be notified and given written instructions for further action.  It is your responsibility to monitor the irradiated area for the 2 weeks following the procedure and to notify your physician if you are concerned that you have suffered a radiation induced injury.    All of the patient's questions were answered, patient is agreeable to proceed.  Consent signed and in chart.      Electronically Signed: D. Rowe Adrion, PA-C 06/27/2017, 8:47 AM   I spent a total of  25 minutes at the the patient's bedside AND on the patient's hospital floor or unit, greater than 50% of which was counseling/coordinating care for hepatic/visceral/hepatic arteriogram with embolization/test y-90 dosing

## 2017-06-27 NOTE — Sedation Documentation (Signed)
Patient is resting comfortably. Resting with eyes closed in NAD. No s/s of pain/discomfort noted. Able to wake and follow breathing commands when instructed and quickly drifts back to sleep when verbal stimuli removed.

## 2017-06-27 NOTE — Sedation Documentation (Signed)
Patient is resting comfortably with eyes closed in NAD. No s/s of pain/discomfort note

## 2017-06-27 NOTE — Sedation Documentation (Signed)
ED Provider at bedside continuing to hold pressure over sheath insertion site

## 2017-06-27 NOTE — Sedation Documentation (Signed)
ED Provider at bedside. Sheath removed by MD at this time and MD holding pressure over insertion site

## 2017-07-07 ENCOUNTER — Other Ambulatory Visit: Payer: Self-pay | Admitting: Radiology

## 2017-07-09 ENCOUNTER — Encounter (HOSPITAL_COMMUNITY): Payer: Self-pay

## 2017-07-09 ENCOUNTER — Encounter (HOSPITAL_COMMUNITY)
Admission: RE | Admit: 2017-07-09 | Discharge: 2017-07-09 | Disposition: A | Discharge status: Home / Self Care | Payer: BLUE CROSS/BLUE SHIELD | Source: Ambulatory Visit | Attending: Interventional Radiology | Admitting: Interventional Radiology

## 2017-07-09 ENCOUNTER — Ambulatory Visit (HOSPITAL_COMMUNITY)
Admission: RE | Admit: 2017-07-09 | Discharge: 2017-07-09 | Disposition: A | Discharge status: Home / Self Care | Payer: BLUE CROSS/BLUE SHIELD | Source: Ambulatory Visit | Attending: Interventional Radiology | Admitting: Interventional Radiology

## 2017-07-09 ENCOUNTER — Other Ambulatory Visit (HOSPITAL_COMMUNITY): Payer: Self-pay | Admitting: Interventional Radiology

## 2017-07-09 DIAGNOSIS — C220 Liver cell carcinoma: Secondary | ICD-10-CM | POA: Insufficient documentation

## 2017-07-09 DIAGNOSIS — R7302 Impaired glucose tolerance (oral): Secondary | ICD-10-CM | POA: Diagnosis not present

## 2017-07-09 DIAGNOSIS — F329 Major depressive disorder, single episode, unspecified: Secondary | ICD-10-CM | POA: Diagnosis not present

## 2017-07-09 DIAGNOSIS — K7030 Alcoholic cirrhosis of liver without ascites: Secondary | ICD-10-CM | POA: Diagnosis not present

## 2017-07-09 DIAGNOSIS — B1920 Unspecified viral hepatitis C without hepatic coma: Secondary | ICD-10-CM | POA: Diagnosis not present

## 2017-07-09 DIAGNOSIS — I10 Essential (primary) hypertension: Secondary | ICD-10-CM | POA: Insufficient documentation

## 2017-07-09 HISTORY — PX: IR ANGIOGRAM VISCERAL SELECTIVE: IMG657

## 2017-07-09 HISTORY — PX: IR US GUIDE VASC ACCESS RIGHT: IMG2390

## 2017-07-09 HISTORY — PX: IR EMBO TUMOR ORGAN ISCHEMIA INFARCT INC GUIDE ROADMAPPING: IMG5449

## 2017-07-09 HISTORY — PX: IR ANGIOGRAM SELECTIVE EACH ADDITIONAL VESSEL: IMG667

## 2017-07-09 LAB — CBC WITH DIFFERENTIAL/PLATELET
Basophils Absolute: 0.1 10*3/uL (ref 0.0–0.1)
Basophils Relative: 1 %
Eosinophils Absolute: 0.3 10*3/uL (ref 0.0–0.7)
Eosinophils Relative: 3 %
HEMATOCRIT: 41.1 % (ref 39.0–52.0)
Hemoglobin: 14.1 g/dL (ref 13.0–17.0)
LYMPHS PCT: 14 %
Lymphs Abs: 1.5 10*3/uL (ref 0.7–4.0)
MCH: 33.7 pg (ref 26.0–34.0)
MCHC: 34.3 g/dL (ref 30.0–36.0)
MCV: 98.3 fL (ref 78.0–100.0)
MONO ABS: 2 10*3/uL — AB (ref 0.1–1.0)
MONOS PCT: 18 %
NEUTROS ABS: 6.9 10*3/uL (ref 1.7–7.7)
Neutrophils Relative %: 64 %
Platelets: 274 10*3/uL (ref 150–400)
RBC: 4.18 MIL/uL — ABNORMAL LOW (ref 4.22–5.81)
RDW: 16.1 % — AB (ref 11.5–15.5)
WBC: 10.7 10*3/uL — ABNORMAL HIGH (ref 4.0–10.5)

## 2017-07-09 LAB — COMPREHENSIVE METABOLIC PANEL
ALT: 44 U/L (ref 17–63)
ANION GAP: 13 (ref 5–15)
AST: 114 U/L — ABNORMAL HIGH (ref 15–41)
Albumin: 3.6 g/dL (ref 3.5–5.0)
Alkaline Phosphatase: 266 U/L — ABNORMAL HIGH (ref 38–126)
BILIRUBIN TOTAL: 1.2 mg/dL (ref 0.3–1.2)
BUN: 13 mg/dL (ref 6–20)
CO2: 22 mmol/L (ref 22–32)
Calcium: 8.9 mg/dL (ref 8.9–10.3)
Chloride: 104 mmol/L (ref 101–111)
Creatinine, Ser: 0.59 mg/dL — ABNORMAL LOW (ref 0.61–1.24)
Glucose, Bld: 112 mg/dL — ABNORMAL HIGH (ref 65–99)
POTASSIUM: 3.9 mmol/L (ref 3.5–5.1)
Sodium: 139 mmol/L (ref 135–145)
TOTAL PROTEIN: 8 g/dL (ref 6.5–8.1)

## 2017-07-09 LAB — PROTIME-INR
INR: 1.01
PROTHROMBIN TIME: 13.2 s (ref 11.4–15.2)

## 2017-07-09 MED ORDER — IOPAMIDOL (ISOVUE-300) INJECTION 61%
INTRAVENOUS | Status: AC
  Filled 2017-07-09: qty 300

## 2017-07-09 MED ORDER — MIDAZOLAM HCL 2 MG/2ML IJ SOLN
INTRAMUSCULAR | Status: AC | PRN
  Administered 2017-07-09: 1 mg via INTRAVENOUS
  Administered 2017-07-09: 0.5 mg via INTRAVENOUS
  Administered 2017-07-09: 1 mg via INTRAVENOUS

## 2017-07-09 MED ORDER — LIDOCAINE-EPINEPHRINE (PF) 2 %-1:200000 IJ SOLN
INTRAMUSCULAR | Status: AC | PRN
  Administered 2017-07-09: 10 mL

## 2017-07-09 MED ORDER — MIDAZOLAM HCL 2 MG/2ML IJ SOLN
INTRAMUSCULAR | Status: AC
  Filled 2017-07-09: qty 4

## 2017-07-09 MED ORDER — LIDOCAINE-EPINEPHRINE (PF) 2 %-1:200000 IJ SOLN
INTRAMUSCULAR | Status: AC
  Filled 2017-07-09: qty 20

## 2017-07-09 MED ORDER — FENTANYL CITRATE (PF) 100 MCG/2ML IJ SOLN
INTRAMUSCULAR | Status: AC | PRN
  Administered 2017-07-09 (×3): 50 ug via INTRAVENOUS

## 2017-07-09 MED ORDER — FENTANYL CITRATE (PF) 100 MCG/2ML IJ SOLN
25.0000 ug | Freq: Once | INTRAMUSCULAR | Status: AC
  Administered 2017-07-09: 25 ug via INTRAVENOUS
  Filled 2017-07-09: qty 2

## 2017-07-09 MED ORDER — IOPAMIDOL (ISOVUE-300) INJECTION 61%
50.0000 mL | Freq: Once | INTRAVENOUS | Status: AC | PRN
  Administered 2017-07-09: 50 mL via INTRA_ARTERIAL

## 2017-07-09 MED ORDER — ONDANSETRON HCL 4 MG/2ML IJ SOLN
4.0000 mg | Freq: Once | INTRAMUSCULAR | Status: AC
  Administered 2017-07-09: 4 mg via INTRAVENOUS
  Filled 2017-07-09: qty 2

## 2017-07-09 MED ORDER — YTTRIUM 90 INJECTION
29.4000 | INJECTION | Freq: Once | INTRAVENOUS | Status: AC
  Administered 2017-07-09: 29.4 via INTRA_ARTERIAL

## 2017-07-09 MED ORDER — PANTOPRAZOLE SODIUM 40 MG IV SOLR
INTRAVENOUS | Status: AC
  Filled 2017-07-09: qty 40

## 2017-07-09 MED ORDER — FENTANYL CITRATE (PF) 100 MCG/2ML IJ SOLN
INTRAMUSCULAR | Status: AC
  Filled 2017-07-09: qty 4

## 2017-07-09 MED ORDER — PANTOPRAZOLE SODIUM 40 MG IV SOLR
40.0000 mg | Freq: Once | INTRAVENOUS | Status: AC
  Administered 2017-07-09: 40 mg via INTRAVENOUS
  Filled 2017-07-09: qty 40

## 2017-07-09 MED ORDER — IOPAMIDOL (ISOVUE-300) INJECTION 61%
15.0000 mL | Freq: Once | INTRAVENOUS | Status: AC | PRN
  Administered 2017-07-09: 15 mL via INTRA_ARTERIAL

## 2017-07-09 MED ORDER — DEXAMETHASONE SODIUM PHOSPHATE 10 MG/ML IJ SOLN
8.0000 mg | Freq: Once | INTRAMUSCULAR | Status: AC
  Administered 2017-07-09: 8 mg via INTRAVENOUS
  Filled 2017-07-09: qty 1

## 2017-07-09 MED ORDER — PIPERACILLIN-TAZOBACTAM 3.375 G IVPB
INTRAVENOUS | Status: AC
  Administered 2017-07-09: 3.375 g via INTRAVENOUS
  Filled 2017-07-09: qty 50

## 2017-07-09 MED ORDER — SODIUM CHLORIDE 0.9 % IV SOLN
INTRAVENOUS | Status: DC
  Administered 2017-07-09: 09:00:00 via INTRAVENOUS

## 2017-07-09 MED ORDER — PIPERACILLIN-TAZOBACTAM 3.375 G IVPB
3.3750 g | Freq: Once | INTRAVENOUS | Status: AC
Start: 2017-07-09 — End: 2017-07-09
  Administered 2017-07-09: 3.375 g via INTRAVENOUS

## 2017-07-09 NOTE — Sedation Documentation (Signed)
Pt resting comfortably. Beginning to wake and stir on table during radiation particles admin. Medicated with fentanyl and half dose of versed d/t hypotension

## 2017-07-09 NOTE — Sedation Documentation (Signed)
Admin radiation particles at this time

## 2017-07-09 NOTE — Sedation Documentation (Signed)
Patient is resting comfortably with eyes closed in NAD. No s/s of pain, including nonverbal s/s of pain, noted

## 2017-07-09 NOTE — Sedation Documentation (Signed)
R femoral sheath removed at this time. VIR tech holding pressure over groin site

## 2017-07-09 NOTE — H&P (Addendum)
Referring Physician(s): Feng,Y/Drazek,D,NP  Supervising Physician: Sandi Mariscal  Patient Status:  [facility]  Chief Complaint:    Subjective: Patient familiar to IR service from prior bland embolization of segments 6 and 8 hepatocellular carcinomas on 09/13/16 followed by thermal ablation on 10/05/16.  He has a history of multifocal HCC, cirrhosis and hepatitis C with recent imaging showing progressive hepatic disease, predominantly in the right lobe.  Following recent discussions with Dr. Pascal Lux he was deemed an appropriate candidate for Y 90 hepatic radioembolization and presents today for the procedure.  He currently denies fever, chest pain, dyspnea, cough, abdominal pain, nausea, vomiting or bleeding.  He does have occ HA'S and intermittent back pain. Past Medical History:  Diagnosis Date  . Cancer ([place])   . Cataract   . Chronic alcoholism ([place])   . Cirrhosis ([place])   . Depression   . Glucose intolerance (impaired glucose tolerance)   . Hepatitis C   . Hypertension   . Tobacco use   . Umbilical hernia    Past Surgical History:  Procedure Laterality Date  . CATARACT EXTRACTION    . COLONOSCOPY  12/09  . INGUINAL HERNIA REPAIR    . IR 3D INDEPENDENT [facility]  09/13/2016  . IR [place] ADDITIONAL VESSEL  09/13/2016  . IR [place] ADDITIONAL VESSEL  09/13/2016  . IR [place] ADDITIONAL VESSEL  09/13/2016  . IR [place] ADDITIONAL VESSEL  09/13/2016  . IR [place] ADDITIONAL VESSEL  09/13/2016  . IR ANGIOGRAM SELECTIVE EACH ADDITIONAL VESSEL  06/27/2017  . IR ANGIOGRAM SELECTIVE EACH ADDITIONAL VESSEL  06/27/2017  . IR ANGIOGRAM SELECTIVE EACH ADDITIONAL VESSEL  06/27/2017  . IR ANGIOGRAM VISCERAL SELECTIVE  09/13/2016  . IR ANGIOGRAM VISCERAL SELECTIVE  06/27/2017  . IR ANGIOGRAM VISCERAL SELECTIVE  06/27/2017  . IR EMBO TUMOR ORGAN ISCHEMIA INFARCT INC GUIDE ROADMAPPING  09/13/2016  . IR RADIOLOGIST EVAL & MGMT  08/21/2016   . IR RADIOLOGIST EVAL & MGMT  08/30/2016  . IR RADIOLOGIST EVAL & MGMT  09/26/2016  . IR RADIOLOGIST EVAL & MGMT  01/10/2017  . IR RADIOLOGIST EVAL & MGMT  11/06/2016  . IR RADIOLOGIST EVAL & MGMT  05/16/2017  . IR RADIOLOGIST EVAL & MGMT  06/13/2017  . IR US GUIDE VASC ACCESS RIGHT  09/13/2016  . IR US GUIDE VASC ACCESS RIGHT  06/27/2017  . left orchiectomy  07/2009  . RADIOFREQUENCY ABLATION N/A 10/05/2016   Procedure: LIVER MICROWAVE THERMAL ABLATION;  Surgeon: Sandi Mariscal, MD;  Location: [facility];  Service: Anesthesiology;  Laterality: N/A;  . UMBILICAL HERNIA REPAIR          Allergies: Patient has no known allergies.  Medications: Prior to Admission medications   Medication Sig Start Date End Date Taking? Authorizing Provider  acetaminophen (TYLENOL) 325 MG tablet Take 650 mg by mouth every 6 (six) hours as needed (for headaches.).   Yes [provider]  b complex vitamins capsule Take 1 capsule by mouth daily.   Yes [provider]  lisinopril-hydrochlorothiazide (PRINZIDE,ZESTORETIC) 10-12.5 MG tablet Take 1 tablet by mouth daily. 02/28/17  Yes Marletta Lor, MD  Multiple Vitamin (MULTIVITAMIN WITH MINERALS) TABS tablet Take 1 tablet by mouth daily.   Yes [provider]  sertraline (ZOLOFT) 100 MG tablet TAKE 1 TABLET BY MOUTH EVERY DAY 04/30/17  Yes Marletta Lor, MD  traZODone (DESYREL) 50 MG tablet TAKE 2 TABLETS BY MOUTH EVERY NIGHT AT BEDTIME  03/01/17  Yes Marletta Lor, MD     Vital Signs: Blood pressure 158/97, heart rate 85, temp 98.9, respirations 18, O2 sat 99% room air    Physical Exam awake, alert.  Chest with few fine basilar crackles bilaterally.  Heart with regular rate and rhythm.  Abdomen soft, positive bowel sounds, nontender.  No lower extremity edema.  Imaging: No results found.  Labs:  CBC: Recent Labs    10/06/16 0435 01/04/17 1050 06/27/17 0745 07/09/17 0837  WBC 19.2* 10.3 10.2 10.7*  HGB 12.3*  12.7* 13.6 14.1  HCT 36.0* 38.2* 39.9 41.1  PLT 199 265 245 274    COAGS: Recent Labs    09/13/16 0942 10/04/16 1000 01/04/17 1050 06/27/17 0745 07/09/17 0837  INR 1.07 1.05 1.0 1.03 1.01  APTT 29  --   --   --   --     BMP: Recent Labs    01/04/17 1050 04/19/17 1436 05/16/17 1129 06/27/17 0745 07/09/17 0837  NA 137  --  135 138 139  K 4.2  --  3.5 3.7 3.9  CL 101  --  98 104 104  CO2 25  --  25 23 22   GLUCOSE 85  --  111 117* 112*  BUN 7 7 7 10 13   CALCIUM 8.4*  --  9.3 8.8* 8.9  CREATININE 0.47* 0.61* 0.63* 0.59* 0.59*  GFRNONAA >89 108 106 >60 >60  GFRAA >89 125 123 >60 >60    LIVER FUNCTION TESTS: Recent Labs    11/01/16 1034 01/04/17 1050 05/16/17 1129 06/27/17 0745 07/09/17 0837  BILITOT 0.7 0.5 0.6 1.0 1.2  AST 58* 64* 84* 117* 114*  ALT 31 25 34 44 44  ALKPHOS 239* 215*  --  244* 266*  PROT 7.3 7.3 7.2 7.8 8.0  ALBUMIN 3.4* 3.4*  --  3.5 3.6    Assessment and Plan: Patient with history of cirrhosis, hepatitis C, multifocal HCC with prior bland arterial embolization as well as thermal ablation in May 2018.  He now presents with progressive hepatic disease, predominantly in right lobe.  He was deemed an appropriate candidate for Y 90 hepatic radioembolization and presents today for treatment of the right hepatic lobe disease.  Risks and benefits of procedure were discussed with the patient including, but not limited to bleeding, infection, vascular injury or contrast induced renal failure.  This procedure involves the use of X-rays and because of the nature of the planned procedure, it is possible that we will have prolonged use of X-ray fluoroscopy.  Potential radiation risks to you include (but are not limited to) the following: - A slightly elevated risk for cancer  several years later in life. This risk is typically less than 0.5% percent. This risk is low in comparison to the normal incidence of human cancer, which is 33% for women and 50% for  men according to the [place]. - Radiation induced injury can include skin redness, resembling a rash, tissue breakdown / ulcers and hair loss (which can be temporary or permanent).   The likelihood of either of these occurring depends on the difficulty of the procedure and whether you are sensitive to radiation due to previous procedures, disease, or genetic conditions.   IF your procedure requires a prolonged use of radiation, you will be notified and given written instructions for further action.  It is your responsibility to monitor the irradiated area for the 2 weeks following the procedure and to notify your physician if you  are concerned that you have suffered a radiation induced injury.        Electronically Signed: D. Rowe Salbador, PA-C 07/09/2017, 9:30 AM   I spent a total of 25 minutes at the the patient's bedside AND on the patient's hospital floor or unit, greater than 50% of which was counseling/coordinating care for hepatic/visceral arteriogram with Y 90 hepatic radioembolization

## 2017-07-09 NOTE — Procedures (Signed)
Pre-procedure Diagnosis: HCC Post-procedure Diagnosis: Same  Post Y-90 radioembolization of the right lobe of the liver.    Complications: None Immediate  EBL: None  Keep right leg straight for 4 hrs.    SignedSandi Mariscal Pager: [phone] 07/09/2017, 12:49 PM

## 2017-07-09 NOTE — Sedation Documentation (Signed)
In NUC MED at this time. Pt resting quietly in NAD. VSS

## 2017-07-09 NOTE — Discharge Instructions (Signed)
Moderate Conscious Sedation, Adult, Care After These instructions provide you with information about caring for yourself after your procedure. Your health care provider may also give you more specific instructions. Your treatment has been planned according to current medical practices, but problems sometimes occur. Call your health care provider if you have any problems or questions after your procedure. What can I expect after the procedure? After your procedure, it is common:  To feel sleepy for several hours.  To feel clumsy and have poor balance for several hours.  To have poor judgment for several hours.  To vomit if you eat too soon.  Follow these instructions at home: For at least 24 hours after the procedure:   Do not: ? Participate in activities where you could fall or become injured. ? Drive. ? Use heavy machinery. ? Drink alcohol. ? Take sleeping pills or medicines that cause drowsiness. ? Make important decisions or sign legal documents. ? Take care of children on your own.  Rest. Eating and drinking  Follow the diet recommended by your health care provider.  If you vomit: ? Drink water, juice, or soup when you can drink without vomiting. ? Make sure you have little or no nausea before eating solid foods. General instructions  Have a responsible adult stay with you until you are awake and alert.  Take over-the-counter and prescription medicines only as told by your health care provider.  If you smoke, do not smoke without supervision.  Keep all follow-up visits as told by your health care provider. This is important. Contact a health care provider if:  You keep feeling nauseous or you keep vomiting.  You feel light-headed.  You develop a rash.  You have a fever. Get help right away if:  You have trouble breathing. This information is not intended to replace advice given to you by your health care provider. Make sure you discuss any questions you have  with your health care provider. Document Released: 02/18/2013 Document Revised: 10/03/2015 Document Reviewed: 08/20/2015 Elsevier Interactive Patient Education  2018 [place] Radioembolization Discharge Instructions  You have been given a radioactive material during your procedure.  While it is safe for you to be discharged home from the hospital, you need to proceed directly home.    Do not use public transportation, including air travel, lasting more than 2 hours for 1 week.  Avoid crowded public places for 1 week.  Adult visitors should try to avoid close contact with you for 1 week.    Children and pregnant females should not visit or have close contact with you for 1 week.  Items that you touch are not radioactive.  Do not sleep in the same bed as your partner for 1 week, and a condom should be used for sexual activity during the first 24 hours.  Your blood may be radioactive and caution should be used if any bleeding occurs during the recovery period.  Body fluids may be radioactive for 24 hours.  Wash your hands after voiding.  Men should sit to urinate.  Dispose of any soiled materials (flush down toilet or place in trash at home) during the first day.  Drink 6 to 8 glasses of fluids per day for 5 days to hydrate yourself.  If you need to see a doctor during the first week, you must let them know that you were treated with yttrium-90 microspheres, and will be slightly radioactive.  They can call Interventional Radiology [phone] with  any questions.    Hepatic Artery Radioembolization, Care After This sheet gives you information about how to care for yourself after your procedure. Your health care provider may also give you more specific instructions. If you have problems or questions, contact your health care provider. What can I expect after the procedure? After the procedure, it is common to have:  A slight fever for 1-2 weeks. If your fever gets  worse, tell your health care provider.  Fatigue.  Loss of appetite. This should gradually improve after about 1 week.  Abdominal pain on your right side.  Soreness and tenderness in your groin area where the needle and catheter were placed (puncture site).  Follow these instructions at home: Puncture site care  Follow instructions from your health care provider about how to take care of the puncture site. Make sure you: ? Wash your hands with soap and water before you change your bandage (dressing). If soap and water are not available, use hand sanitizer. ? Change your dressing as told by your health care provider. ? Leave stitches (sutures), skin glue, or adhesive strips in place. These skin closures may need to stay in place for 2 weeks or longer. If adhesive strip edges start to loosen and curl up, you may trim the loose edges. Do not remove adhesive strips completely unless your health care provider tells you to do that.  Check your puncture site every day for signs of infection. Check for: ? More redness, swelling, or pain. ? More fluid or blood. ? Warmth. ? Pus or a bad smell. Activity  Rest and return to your normal activities as told by your health care provider. Ask your health care provider what activities are safe for you.  Do not drive for 24 hours after the procedure if you were given a medicine to help you relax (sedative).  Do not lift anything that is heavier than 10 lb (4.5 kg) until your health care provider says that it is safe. Medicines  Take over-the-counter and prescription medicines only as told by your health care provider.  Do not drive or use heavy machinery while taking prescription pain medicine. Radiation precautions  For up to a week after your procedure, there will be a small amount of radioactivity near your liver. This is not especially dangerous to other people. However, you should follow these precautions for 7 days: ? Do not come in close  contact with people. ? Do not sleep in the same bed as someone else. ? Do not hold children or babies. ? Do not have contact with pregnant women. General instructions   To prevent or treat constipation while you are taking prescription pain medicine, your health care provider may recommend that you: ? Drink enough fluid to keep your urine clear or pale yellow. ? Take over-the-counter or prescription medicines. ? Eat foods that are high in fiber, such as fresh fruits and vegetables, whole grains, and beans. ? Limit foods that are high in fat and processed sugars, such as fried and sweet foods.  Eat frequent small meals until your appetite returns. Follow instructions from your health care provider about eating or drinking restrictions.  Do not take baths, swim, or use a hot tub until your health care provider approves. You may take showers. Wash your puncture site with mild soap and water and pat the area dry.  Wear compression stockings as told by your health care provider. These stockings help to prevent blood clots and reduce swelling in your  legs.  Keep all follow-up visits as told by your health care provider. This is important. You may need to have blood tests and imaging tests done. Contact a health care provider if:  You have more redness, swelling, or pain around your puncture site.  You have more fluid or blood coming from your puncture site.  Your puncture site feels warm to the touch.  You have pus or a bad smell coming from your puncture site.  You have pain that: ? Gets worse. ? Does not get better with medicine. ? Feels like very bad heartburn. ? Is in the middle of your abdomen, above your belly button.  Your skin and the white parts of your eyes turn yellow (jaundice).  The color of your urine changes to dark brown.  The color of your stool changes to light yellow.  Your abdominal measurement (girth) increases in a short period of time.  You gain more than  5 lb (2.3 kg) in a short period of time. Get help right away if:  You have a fever that lasts longer than 2 weeks or is higher than what your health care provider told you to expect.  You develop any of the following in your legs: ? Pain. ? Swelling. ? Skin that is cold or pale or turns blue.  You have chest pain.  You have blood in your vomit, saliva, or stool.  You have trouble breathing. This information is not intended to replace advice given to you by your health care provider. Make sure you discuss any questions you have with your health care provider. Document Released: 05/05/2013 Document Revised: 01/28/2016 Document Reviewed: 01/28/2016 Elsevier Interactive Patient Education  [employer].

## 2017-07-12 ENCOUNTER — Other Ambulatory Visit: Payer: Self-pay | Admitting: *Deleted

## 2017-07-12 ENCOUNTER — Other Ambulatory Visit (HOSPITAL_COMMUNITY): Payer: Self-pay | Admitting: Interventional Radiology

## 2017-07-12 DIAGNOSIS — C220 Liver cell carcinoma: Secondary | ICD-10-CM

## 2017-07-30 LAB — COMPLETE METABOLIC PANEL WITH GFR
AG Ratio: 0.8 (calc) — ABNORMAL LOW (ref 1.0–2.5)
ALKALINE PHOSPHATASE (APISO): 326 U/L — AB (ref 40–115)
ALT: 34 U/L (ref 9–46)
AST: 99 U/L — AB (ref 10–35)
Albumin: 3.3 g/dL — ABNORMAL LOW (ref 3.6–5.1)
BUN/Creatinine Ratio: 13 (calc) (ref 6–22)
BUN: 6 mg/dL — AB (ref 7–25)
CO2: 28 mmol/L (ref 20–32)
CREATININE: 0.48 mg/dL — AB (ref 0.70–1.25)
Calcium: 8.9 mg/dL (ref 8.6–10.3)
Chloride: 101 mmol/L (ref 98–110)
GFR, Est African American: 138 mL/min/{1.73_m2} (ref >=60)
GFR, Est Non African American: 119 mL/min/{1.73_m2} (ref >=60)
GLOBULIN: 3.9 g/dL — AB (ref 1.9–3.7)
Glucose, Bld: 86 mg/dL (ref 65–139)
Potassium: 4.1 mmol/L (ref 3.5–5.3)
SODIUM: 137 mmol/L (ref 135–146)
Total Bilirubin: 0.7 mg/dL (ref 0.2–1.2)
Total Protein: 7.2 g/dL (ref 6.1–8.1)

## 2017-08-01 ENCOUNTER — Ambulatory Visit
Admission: RE | Admit: 2017-08-01 | Discharge: 2017-08-01 | Disposition: A | Discharge status: Home / Self Care | Payer: BLUE CROSS/BLUE SHIELD | Source: Ambulatory Visit | Attending: Interventional Radiology | Admitting: Interventional Radiology

## 2017-08-01 ENCOUNTER — Encounter: Payer: Self-pay | Admitting: Radiology

## 2017-08-01 DIAGNOSIS — C220 Liver cell carcinoma: Secondary | ICD-10-CM

## 2017-08-01 HISTORY — PX: IR RADIOLOGIST EVAL & MGMT: IMG5224

## 2017-08-01 NOTE — Progress Notes (Signed)
Patient ID: Joshua [name], male   DOB: [birth year], 62 y.o.   MRN: [id number]        Chief Complaint: Multifocal [place]  Referring Physician(s): Burr Medico (GI) [place] (PCP)  History of Present Illness: Joshua [name] is a 62 y.o. male with past history significant for hypertension, smoking, hepatitis C, alcoholic cirrhosis and multifocal hepatocellular carcinoma who underwent tentatively successful bland embolization to discrete hepatic lesions on 09/13/2016 followed by ultrasound and CT-guided microwave ablation on 10/05/2016 who was found to have progression of multicentric disease on six-month follow-up surveillance MRI performed 04/26/2017.  As such, the patient underwent Y-90 mapping procedure on 06/27/2017 and subsequent Y-90 embolization of the right lobe of the liver on 07/09/2017. The patient returns today to the interventional radiology clinic for post procedural evaluation and management. He is again accompanied by his mother and stepfather though largely serves as his own historian.  Patient states that his energy level and strength have remained reduced following the Y 90 embolization. Specifically, patient states he has noticed decreased ability to walk long distances.   Additionally, the patient reports the sensation of abdominal bloating for the 1-2 weeks following the procedure, however states this has subsequently resolved. Patient denies increased abdominal girth. He denies constipation, bloody or melanotic stools. Patient also admits to reduced appetite.  Patient is otherwise without complaint. Specifically, no yellowing of the skin or eyes. No change in mental status. No chest pain or shortness of breath.  No fever or chills.   Past Medical History:  Diagnosis Date  . Cancer ([place])   . Cataract   . Chronic alcoholism ([place])   . Cirrhosis ([place])   . Depression   . Glucose intolerance (impaired glucose tolerance)   . Hepatitis C   . Hypertension   . Tobacco use   . Umbilical  hernia     Past Surgical History:  Procedure Laterality Date  . CATARACT EXTRACTION    . COLONOSCOPY  12/09  . INGUINAL HERNIA REPAIR    . IR 3D INDEPENDENT WKST  09/13/2016  . IR [place] ADDITIONAL VESSEL  09/13/2016  . IR [place] ADDITIONAL VESSEL  09/13/2016  . IR [place] ADDITIONAL VESSEL  09/13/2016  . IR [place] ADDITIONAL VESSEL  09/13/2016  . IR [place] ADDITIONAL VESSEL  09/13/2016  . IR ANGIOGRAM SELECTIVE EACH ADDITIONAL VESSEL  06/27/2017  . IR ANGIOGRAM SELECTIVE EACH ADDITIONAL VESSEL  06/27/2017  . IR ANGIOGRAM SELECTIVE EACH ADDITIONAL VESSEL  06/27/2017  . IR ANGIOGRAM SELECTIVE EACH ADDITIONAL VESSEL  07/09/2017  . IR ANGIOGRAM SELECTIVE EACH ADDITIONAL VESSEL  07/09/2017  . IR ANGIOGRAM VISCERAL SELECTIVE  09/13/2016  . IR ANGIOGRAM VISCERAL SELECTIVE  06/27/2017  . IR ANGIOGRAM VISCERAL SELECTIVE  06/27/2017  . IR ANGIOGRAM VISCERAL SELECTIVE  07/09/2017  . IR EMBO TUMOR ORGAN ISCHEMIA INFARCT INC GUIDE ROADMAPPING  09/13/2016  . IR EMBO TUMOR ORGAN ISCHEMIA INFARCT INC GUIDE ROADMAPPING  07/09/2017  . IR RADIOLOGIST EVAL & MGMT  08/21/2016  . IR RADIOLOGIST EVAL & MGMT  08/30/2016  . IR RADIOLOGIST EVAL & MGMT  09/26/2016  . IR RADIOLOGIST EVAL & MGMT  01/10/2017  . IR RADIOLOGIST EVAL & MGMT  11/06/2016  . IR RADIOLOGIST EVAL & MGMT  05/16/2017  . IR RADIOLOGIST EVAL & MGMT  06/13/2017  . IR US GUIDE VASC ACCESS RIGHT  09/13/2016  . IR US GUIDE VASC ACCESS RIGHT  06/27/2017  . IR US GUIDE VASC ACCESS  RIGHT  07/09/2017  . left orchiectomy  07/2009  . RADIOFREQUENCY ABLATION N/A 10/05/2016   Procedure: LIVER MICROWAVE THERMAL ABLATION;  Surgeon: Sandi Mariscal, MD;  Location: [facility];  Service: Anesthesiology;  Laterality: N/A;  . UMBILICAL HERNIA REPAIR      Allergies: Patient has no known allergies.  Medications: Prior to Admission medications   Medication Sig Start Date End Date Taking? Authorizing Provider   b complex vitamins capsule Take 1 capsule by mouth daily.   Yes [provider]  lisinopril-hydrochlorothiazide (PRINZIDE,ZESTORETIC) 10-12.5 MG tablet Take 1 tablet by mouth daily. 02/28/17  Yes Marletta Lor, MD  Multiple Vitamin (MULTIVITAMIN WITH MINERALS) TABS tablet Take 1 tablet by mouth daily.   Yes [provider]  sertraline (ZOLOFT) 100 MG tablet TAKE 1 TABLET BY MOUTH EVERY DAY 04/30/17  Yes Marletta Lor, MD  traZODone (DESYREL) 50 MG tablet TAKE 2 TABLETS BY MOUTH EVERY NIGHT AT BEDTIME 03/01/17  Yes Marletta Lor, MD  acetaminophen (TYLENOL) 325 MG tablet Take 650 mg by mouth every 6 (six) hours as needed (for headaches.).    [provider]     Family History  Problem Relation Age of Onset  . Healthy Mother   . Colon cancer Neg Hx   . Stomach cancer Neg Hx   . Esophageal cancer Neg Hx   . Rectal cancer Neg Hx     Social History   Socioeconomic History  . Marital status: Single    Spouse name: Not on file  . Number of children: Not on file  . Years of education: Not on file  . Highest education level: Not on file  Occupational History  . Not on file  Social Needs  . Financial resource strain: Not on file  . Food insecurity:    Worry: Not on file    Inability: Not on file  . Transportation needs:    Medical: Not on file    Non-medical: Not on file  Tobacco Use  . Smoking status: Current Every Day Smoker    Packs/day: 0.50    Years: 42.00    Pack years: 21.00    Types: Cigarettes    Start date: 05/21/1971  . Smokeless tobacco: Former Systems developer  . Tobacco comment: Has cut down to about 1/3 pack per day  Substance and Sexual Activity  . Alcohol use: No    Alcohol/week: 16.8 oz    Types: 28 Cans of beer per week    Comment: hx of nothing current   . Drug use: No  . Sexual activity: Not on file  Lifestyle  . Physical activity:    Days per week: Not on file    Minutes per session: Not on file  . Stress: Not on  file  Relationships  . Social connections:    Talks on phone: Not on file    Gets together: Not on file    Attends religious service: Not on file    Active member of club or organization: Not on file    Attends meetings of clubs or organizations: Not on file    Relationship status: Not on file  Other Topics Concern  . Not on file  Social History Narrative  . Not on file    ECOG Status: 2 - Symptomatic, <50% confined to bed  Review of Systems: A 12 point ROS discussed and pertinent positives are indicated in the HPI above.  All other systems are negative.  Review of Systems  Constitutional: Positive  for activity change and appetite change. Negative for chills, fever and unexpected weight change.  Respiratory: Negative.   Cardiovascular: Negative.   Gastrointestinal: Negative for blood in stool and constipation.       Admits to abdominal bloating for 1-2 weeks following the radioembolization though states this has subsequently resolved.  Skin: Negative for color change.  Psychiatric/Behavioral: Negative for confusion.    Vital Signs: BP (!) 145/83   Pulse 76   Temp 99.6 F (37.6 C) (Oral)   Resp 15   Ht '5\' 11"'$  (1.803 m)   Wt 155 lb (70.3 kg)   SpO2 97%   BMI 21.62 kg/m   Physical Exam  Constitutional:  Appears older than his stated age.  HENT:  Head: Normocephalic and atraumatic.  Eyes: Conjunctivae are normal.  Skin: Skin is warm and dry.  Psychiatric: He has a normal mood and affect. His behavior is normal.  Nursing note and vitals reviewed.   Imaging:  Selected images from abdominal MRI performed 04/26/2017 and 01/10/2017 as well as Y 90 mapping procedure performed 06/27/2017 and Y 90 radioembolization of the right lobe of the liver performed 07/09/2017 were reviewed in detail with the patient and the patient's family.  Nm Liver Img Spect  Result Date: 07/09/2017 CLINICAL DATA:  Cirrhotic patient with multifocal hepatocellular carcinoma primarily within the  RIGHT hepatic lobe. Patient status post ablation of 2 sites in the RIGHT hepatic lobe with recurrence noted on follow-up MRI. EXAM: NUCLEAR MEDICINE SPECIAL MED RAD PHYSICS CONS; NUCLEAR MEDICINE RADIO PHARM THERAPY INTRA ARTERIAL; NUCLEAR MEDICINE TREATMENT PROCEDURE; NUCLEAR MEDICINE LIVER SCAN TECHNIQUE: In conjunction with the interventional radiologist a Y- Microsphere dose was calculated utilizing body surface area formulation. Calculated dose equal 30.2 mCi. 12% dose reduction for cirrhosis. Pre therapy MAA liver SPECT scan and CTA were evaluated. Utilizing a microcatheter system, the RIGHT hepatic artery was selected and Y-90 microspheres were delivered in fractionated aliquots. Radiopharmaceutical was delivered by the interventional radiologist and nuclear radiologist. The patient tolerated procedure well. No adverse effects were noted. Bremsstrahlung planar and SPECT imaging of the abdomen following intrahepatic arterial delivery of Y-90 microsphere was performed. RADIOPHARMACEUTICALS:  01.7 millicuries yttrium 90 microspheres COMPARISON:  MAA scan 06/27/2017, CT 01/04/2018. FINDINGS: Y - 90 microspheres therapy as above. First therapy the right hepatic lobe. Bremsstrahlung planar and SPECT imaging of the abdomen following intrahepatic arterial delivery of Y-40mcrosphere demonstrates radioactivity localized to the RIGHT hepatic lobe. No evidence of extrahepatic activity. IMPRESSION: Successful Y - 90 microsphere delivery for treatment of unresectable liver metastasis. First therapy to the RIGHT lobe. Bremssstrahlung scan demonstrates activity localized to RIGHT hepatic lobe with no extrahepatic activity identified. Electronically Signed   By: SSuzy BouchardM.D.   On: 07/09/2017 13:36   Ir Angiogram Visceral Selective  Result Date: 07/09/2017 INDICATION: History of progressive multifocal hepatocellular carcinoma. Please refer to formal consultation in the epic EMR dated 06/13/2017 for additional  details. Patient presents today for Y 90 radioembolization of the right lobe of the liver. EXAM: 1. CELIAC ARTERIOGRAM 2. SELECTIVE COMMON AND RIGHT HEPATIC ARTERIOGRAM 3. TRANSCATHETER YTTRIUM-90 RADIOEMBOLIZATION OF THE RIGHT LOBE OF THE LIVER 4. FLUOROSCOPIC GUIDED ADMINISTRATION OF SIR-SPHERES 5. ULTRASOUND GUIDED VASCULAR ACCESS OF THE RIGHT COMMON FEMORAL ARTERY COMPARISON:  Pre Y 90 mapping procedure - 06/27/2017; CTA of the abdomen pelvis - 06/17/2017; abdominal MRI - 04/26/2017; 01/11/2027 MEDICATIONS: Protonix 40 mg IV; Decadron 20 mg IV; Zofran 4 mg IV Zosyn 3.75 g IV; the antibiotics were administered within an appropriate time  frame prior to the initiation of the procedure. CONTRAST:  40 cc Isovue-300 ANESTHESIA/SEDATION: Moderate (conscious) sedation was employed during this procedure. A total of Versed 2.5 mg and Fentanyl 150 mcg was administered intravenously. Moderate Sedation Time: 46 minutes. The patient's level of consciousness and vital signs were monitored continuously by radiology nursing throughout the procedure under my direct supervision. FLUOROSCOPY TIME:  5 minutes 6 seconds (264 mGy) ACCESS: Right common femoral artery; hemostasis achieved with manual compression. COMPLICATIONS: None immediate. TECHNIQUE: Informed written consent was obtained from the patient after a discussion of the risks, benefits and alternatives to treatment. Questions regarding the procedure were encouraged and answered. A timeout was performed prior to the initiation of the procedure. The right groin was prepped and draped in the usual sterile fashion, and a sterile drape was applied covering the operative field. Maximum barrier sterile technique with sterile gowns and gloves were used for the procedure. A timeout was performed prior to the initiation of the procedure. Local anesthesia was provided with 1% lidocaine. The right femoral head was marked fluoroscopically. Under direct ultrasound guidance, the right  common femoral artery was accessed with a micropuncture kit allowing placement of a of 5-French vascular sheath. An ultrasound image was saved for documentation purposes. A limited arteriogram performed through the side arm of the sheath confirming appropriate access within the right common femoral artery. Over a Britta Mccreedy wire, a Mickelson catheter was advanced to the level of the inferior thoracic aorta where it was reformed, back bled and flushed. The Mickelson catheter was utilized to select the celiac artery and a celiac arteriogram was performed. With the use of a fathom 14 microwire, a high-flow Renegade microcatheter was advanced into the common hepatic artery and a selective common hepatic arteriogram was performed. The microcatheter was advanced to the level of the bifurcation of the right hepatic artery and a selective right hepatic arteriogram was performed. Radioembolization was then performed with Yttrium-90 SIR Spheres. Particles were administered via a microcatheter utilizing a completely enclosed system. Monitoring of antegrade flow was performed during and after the administration under fluoroscopy with use of contrast intermittently. After the administration of the particles, the microcatheter, outer catheter and administration system were then discarded into radiation safety receptacle. At this point, the procedure was terminated. The right common femoral approach vascular sheath was removed and hemostasis was achieved with manual compression. A dressing was placed. The patient tolerated procedure well without immediate postprocedural complication. All participants involved with the procedure were surveyed by the radiation safety officer prior to exiting the fluoroscopy suite. The patient was escorted to the nuclear medicine department for post-treatment imaging. FINDINGS: Unchanged celiac arteriogram with relatively conventional anatomy. Delayed imaging demonstrates abnormal malignant vascularity  involving the dome as well as the inferolateral aspect the right lobe of the liver compatible with known multifocal hepatocellular carcinoma demonstrated on preprocedural abdominal CT. The high-flow microcatheter was advanced to the level of the right hepatic artery's bifurcation at the location of the administered mapping dose of MAA. Intermittent contrast injections during and after the radioembolization confirming preserved antegrade flow without reflux. IMPRESSION: Technically successful radioembolization of the right lobe of the liver with Yttrium-90 microspheres. PLAN: - The patient will be seen in 4-5 weeks at the interventional radiology clinic following the acquisition of a postprocedural CMP. - Pending the patient's recovery from this procedure, we will consider radioembolization of the left lobe of the liver. Electronically Signed   By: Sandi Mariscal M.D.   On: 07/09/2017 13:44  Ir Angiogram Selective Each Additional Vessel  Result Date: 07/09/2017 INDICATION: History of progressive multifocal hepatocellular carcinoma. Please refer to formal consultation in the epic EMR dated 06/13/2017 for additional details. Patient presents today for Y 90 radioembolization of the right lobe of the liver. EXAM: 1. CELIAC ARTERIOGRAM 2. SELECTIVE COMMON AND RIGHT HEPATIC ARTERIOGRAM 3. TRANSCATHETER YTTRIUM-90 RADIOEMBOLIZATION OF THE RIGHT LOBE OF THE LIVER 4. FLUOROSCOPIC GUIDED ADMINISTRATION OF SIR-SPHERES 5. ULTRASOUND GUIDED VASCULAR ACCESS OF THE RIGHT COMMON FEMORAL ARTERY COMPARISON:  Pre Y 90 mapping procedure - 06/27/2017; CTA of the abdomen pelvis - 06/17/2017; abdominal MRI - 04/26/2017; 01/11/2027 MEDICATIONS: Protonix 40 mg IV; Decadron 20 mg IV; Zofran 4 mg IV Zosyn 3.75 g IV; the antibiotics were administered within an appropriate time frame prior to the initiation of the procedure. CONTRAST:  40 cc Isovue-300 ANESTHESIA/SEDATION: Moderate (conscious) sedation was employed during this procedure. A  total of Versed 2.5 mg and Fentanyl 150 mcg was administered intravenously. Moderate Sedation Time: 46 minutes. The patient's level of consciousness and vital signs were monitored continuously by radiology nursing throughout the procedure under my direct supervision. FLUOROSCOPY TIME:  5 minutes 6 seconds (264 mGy) ACCESS: Right common femoral artery; hemostasis achieved with manual compression. COMPLICATIONS: None immediate. TECHNIQUE: Informed written consent was obtained from the patient after a discussion of the risks, benefits and alternatives to treatment. Questions regarding the procedure were encouraged and answered. A timeout was performed prior to the initiation of the procedure. The right groin was prepped and draped in the usual sterile fashion, and a sterile drape was applied covering the operative field. Maximum barrier sterile technique with sterile gowns and gloves were used for the procedure. A timeout was performed prior to the initiation of the procedure. Local anesthesia was provided with 1% lidocaine. The right femoral head was marked fluoroscopically. Under direct ultrasound guidance, the right common femoral artery was accessed with a micropuncture kit allowing placement of a of 5-French vascular sheath. An ultrasound image was saved for documentation purposes. A limited arteriogram performed through the side arm of the sheath confirming appropriate access within the right common femoral artery. Over a Britta Mccreedy wire, a Mickelson catheter was advanced to the level of the inferior thoracic aorta where it was reformed, back bled and flushed. The Mickelson catheter was utilized to select the celiac artery and a celiac arteriogram was performed. With the use of a fathom 14 microwire, a high-flow Renegade microcatheter was advanced into the common hepatic artery and a selective common hepatic arteriogram was performed. The microcatheter was advanced to the level of the bifurcation of the right hepatic  artery and a selective right hepatic arteriogram was performed. Radioembolization was then performed with Yttrium-90 SIR Spheres. Particles were administered via a microcatheter utilizing a completely enclosed system. Monitoring of antegrade flow was performed during and after the administration under fluoroscopy with use of contrast intermittently. After the administration of the particles, the microcatheter, outer catheter and administration system were then discarded into radiation safety receptacle. At this point, the procedure was terminated. The right common femoral approach vascular sheath was removed and hemostasis was achieved with manual compression. A dressing was placed. The patient tolerated procedure well without immediate postprocedural complication. All participants involved with the procedure were surveyed by the radiation safety officer prior to exiting the fluoroscopy suite. The patient was escorted to the nuclear medicine department for post-treatment imaging. FINDINGS: Unchanged celiac arteriogram with relatively conventional anatomy. Delayed imaging demonstrates abnormal malignant vascularity involving the dome as well  as the inferolateral aspect the right lobe of the liver compatible with known multifocal hepatocellular carcinoma demonstrated on preprocedural abdominal CT. The high-flow microcatheter was advanced to the level of the right hepatic artery's bifurcation at the location of the administered mapping dose of MAA. Intermittent contrast injections during and after the radioembolization confirming preserved antegrade flow without reflux. IMPRESSION: Technically successful radioembolization of the right lobe of the liver with Yttrium-90 microspheres. PLAN: - The patient will be seen in 4-5 weeks at the interventional radiology clinic following the acquisition of a postprocedural CMP. - Pending the patient's recovery from this procedure, we will consider radioembolization of the left lobe  of the liver. Electronically Signed   By: Sandi Mariscal M.D.   On: 07/09/2017 13:44   Ir Angiogram Selective Each Additional Vessel  Result Date: 07/09/2017 INDICATION: History of progressive multifocal hepatocellular carcinoma. Please refer to formal consultation in the epic EMR dated 06/13/2017 for additional details. Patient presents today for Y 90 radioembolization of the right lobe of the liver. EXAM: 1. CELIAC ARTERIOGRAM 2. SELECTIVE COMMON AND RIGHT HEPATIC ARTERIOGRAM 3. TRANSCATHETER YTTRIUM-90 RADIOEMBOLIZATION OF THE RIGHT LOBE OF THE LIVER 4. FLUOROSCOPIC GUIDED ADMINISTRATION OF SIR-SPHERES 5. ULTRASOUND GUIDED VASCULAR ACCESS OF THE RIGHT COMMON FEMORAL ARTERY COMPARISON:  Pre Y 90 mapping procedure - 06/27/2017; CTA of the abdomen pelvis - 06/17/2017; abdominal MRI - 04/26/2017; 01/11/2027 MEDICATIONS: Protonix 40 mg IV; Decadron 20 mg IV; Zofran 4 mg IV Zosyn 3.75 g IV; the antibiotics were administered within an appropriate time frame prior to the initiation of the procedure. CONTRAST:  40 cc Isovue-300 ANESTHESIA/SEDATION: Moderate (conscious) sedation was employed during this procedure. A total of Versed 2.5 mg and Fentanyl 150 mcg was administered intravenously. Moderate Sedation Time: 46 minutes. The patient's level of consciousness and vital signs were monitored continuously by radiology nursing throughout the procedure under my direct supervision. FLUOROSCOPY TIME:  5 minutes 6 seconds (264 mGy) ACCESS: Right common femoral artery; hemostasis achieved with manual compression. COMPLICATIONS: None immediate. TECHNIQUE: Informed written consent was obtained from the patient after a discussion of the risks, benefits and alternatives to treatment. Questions regarding the procedure were encouraged and answered. A timeout was performed prior to the initiation of the procedure. The right groin was prepped and draped in the usual sterile fashion, and a sterile drape was applied covering the  operative field. Maximum barrier sterile technique with sterile gowns and gloves were used for the procedure. A timeout was performed prior to the initiation of the procedure. Local anesthesia was provided with 1% lidocaine. The right femoral head was marked fluoroscopically. Under direct ultrasound guidance, the right common femoral artery was accessed with a micropuncture kit allowing placement of a of 5-French vascular sheath. An ultrasound image was saved for documentation purposes. A limited arteriogram performed through the side arm of the sheath confirming appropriate access within the right common femoral artery. Over a Britta Mccreedy wire, a Mickelson catheter was advanced to the level of the inferior thoracic aorta where it was reformed, back bled and flushed. The Mickelson catheter was utilized to select the celiac artery and a celiac arteriogram was performed. With the use of a fathom 14 microwire, a high-flow Renegade microcatheter was advanced into the common hepatic artery and a selective common hepatic arteriogram was performed. The microcatheter was advanced to the level of the bifurcation of the right hepatic artery and a selective right hepatic arteriogram was performed. Radioembolization was then performed with Yttrium-90 SIR Spheres. Particles were administered via a  microcatheter utilizing a completely enclosed system. Monitoring of antegrade flow was performed during and after the administration under fluoroscopy with use of contrast intermittently. After the administration of the particles, the microcatheter, outer catheter and administration system were then discarded into radiation safety receptacle. At this point, the procedure was terminated. The right common femoral approach vascular sheath was removed and hemostasis was achieved with manual compression. A dressing was placed. The patient tolerated procedure well without immediate postprocedural complication. All participants involved with the  procedure were surveyed by the radiation safety officer prior to exiting the fluoroscopy suite. The patient was escorted to the nuclear medicine department for post-treatment imaging. FINDINGS: Unchanged celiac arteriogram with relatively conventional anatomy. Delayed imaging demonstrates abnormal malignant vascularity involving the dome as well as the inferolateral aspect the right lobe of the liver compatible with known multifocal hepatocellular carcinoma demonstrated on preprocedural abdominal CT. The high-flow microcatheter was advanced to the level of the right hepatic artery's bifurcation at the location of the administered mapping dose of MAA. Intermittent contrast injections during and after the radioembolization confirming preserved antegrade flow without reflux. IMPRESSION: Technically successful radioembolization of the right lobe of the liver with Yttrium-90 microspheres. PLAN: - The patient will be seen in 4-5 weeks at the interventional radiology clinic following the acquisition of a postprocedural CMP. - Pending the patient's recovery from this procedure, we will consider radioembolization of the left lobe of the liver. Electronically Signed   By: Sandi Mariscal M.D.   On: 07/09/2017 13:44   Nm Special Med Rad Physics Cons  Result Date: 07/09/2017 CLINICAL DATA:  Cirrhotic patient with multifocal hepatocellular carcinoma primarily within the RIGHT hepatic lobe. Patient status post ablation of 2 sites in the RIGHT hepatic lobe with recurrence noted on follow-up MRI. EXAM: NUCLEAR MEDICINE SPECIAL MED RAD PHYSICS CONS; NUCLEAR MEDICINE RADIO PHARM THERAPY INTRA ARTERIAL; NUCLEAR MEDICINE TREATMENT PROCEDURE; NUCLEAR MEDICINE LIVER SCAN TECHNIQUE: In conjunction with the interventional radiologist a Y- Microsphere dose was calculated utilizing body surface area formulation. Calculated dose equal 30.2 mCi. 12% dose reduction for cirrhosis. Pre therapy MAA liver SPECT scan and CTA were evaluated.  Utilizing a microcatheter system, the RIGHT hepatic artery was selected and Y-90 microspheres were delivered in fractionated aliquots. Radiopharmaceutical was delivered by the interventional radiologist and nuclear radiologist. The patient tolerated procedure well. No adverse effects were noted. Bremsstrahlung planar and SPECT imaging of the abdomen following intrahepatic arterial delivery of Y-90 microsphere was performed. RADIOPHARMACEUTICALS:  85.4 millicuries yttrium 90 microspheres COMPARISON:  MAA scan 06/27/2017, CT 01/04/2018. FINDINGS: Y - 90 microspheres therapy as above. First therapy the right hepatic lobe. Bremsstrahlung planar and SPECT imaging of the abdomen following intrahepatic arterial delivery of Y-83mcrosphere demonstrates radioactivity localized to the RIGHT hepatic lobe. No evidence of extrahepatic activity. IMPRESSION: Successful Y - 90 microsphere delivery for treatment of unresectable liver metastasis. First therapy to the RIGHT lobe. Bremssstrahlung scan demonstrates activity localized to RIGHT hepatic lobe with no extrahepatic activity identified. Electronically Signed   By: SSuzy BouchardM.D.   On: 07/09/2017 13:36   Nm Special Treatment Procedure  Result Date: 07/09/2017 CLINICAL DATA:  Cirrhotic patient with multifocal hepatocellular carcinoma primarily within the RIGHT hepatic lobe. Patient status post ablation of 2 sites in the RIGHT hepatic lobe with recurrence noted on follow-up MRI. EXAM: NUCLEAR MEDICINE SPECIAL MED RAD PHYSICS CONS; NUCLEAR MEDICINE RADIO PHARM THERAPY INTRA ARTERIAL; NUCLEAR MEDICINE TREATMENT PROCEDURE; NUCLEAR MEDICINE LIVER SCAN TECHNIQUE: In conjunction with the interventional radiologist a Y- Microsphere dose  was calculated utilizing body surface area formulation. Calculated dose equal 30.2 mCi. 12% dose reduction for cirrhosis. Pre therapy MAA liver SPECT scan and CTA were evaluated. Utilizing a microcatheter system, the RIGHT hepatic artery was  selected and Y-90 microspheres were delivered in fractionated aliquots. Radiopharmaceutical was delivered by the interventional radiologist and nuclear radiologist. The patient tolerated procedure well. No adverse effects were noted. Bremsstrahlung planar and SPECT imaging of the abdomen following intrahepatic arterial delivery of Y-90 microsphere was performed. RADIOPHARMACEUTICALS:  27.7 millicuries yttrium 90 microspheres COMPARISON:  MAA scan 06/27/2017, CT 01/04/2018. FINDINGS: Y - 90 microspheres therapy as above. First therapy the right hepatic lobe. Bremsstrahlung planar and SPECT imaging of the abdomen following intrahepatic arterial delivery of Y-55mcrosphere demonstrates radioactivity localized to the RIGHT hepatic lobe. No evidence of extrahepatic activity. IMPRESSION: Successful Y - 90 microsphere delivery for treatment of unresectable liver metastasis. First therapy to the RIGHT lobe. Bremssstrahlung scan demonstrates activity localized to RIGHT hepatic lobe with no extrahepatic activity identified. Electronically Signed   By: SSuzy BouchardM.D.   On: 07/09/2017 13:36   Ir [place]Guide Vasc Access Right  Result Date: 07/09/2017 INDICATION: History of progressive multifocal hepatocellular carcinoma. Please refer to formal consultation in the epic EMR dated 06/13/2017 for additional details. Patient presents today for Y 90 radioembolization of the right lobe of the liver. EXAM: 1. CELIAC ARTERIOGRAM 2. SELECTIVE COMMON AND RIGHT HEPATIC ARTERIOGRAM 3. TRANSCATHETER YTTRIUM-90 RADIOEMBOLIZATION OF THE RIGHT LOBE OF THE LIVER 4. FLUOROSCOPIC GUIDED ADMINISTRATION OF SIR-SPHERES 5. ULTRASOUND GUIDED VASCULAR ACCESS OF THE RIGHT COMMON FEMORAL ARTERY COMPARISON:  Pre Y 90 mapping procedure - 06/27/2017; CTA of the abdomen pelvis - 06/17/2017; abdominal MRI - 04/26/2017; 01/11/2027 MEDICATIONS: Protonix 40 mg IV; Decadron 20 mg IV; Zofran 4 mg IV Zosyn 3.75 g IV; the antibiotics were administered within  an appropriate time frame prior to the initiation of the procedure. CONTRAST:  40 cc Isovue-300 ANESTHESIA/SEDATION: Moderate (conscious) sedation was employed during this procedure. A total of Versed 2.5 mg and Fentanyl 150 mcg was administered intravenously. Moderate Sedation Time: 46 minutes. The patient's level of consciousness and vital signs were monitored continuously by radiology nursing throughout the procedure under my direct supervision. FLUOROSCOPY TIME:  5 minutes 6 seconds (264 mGy) ACCESS: Right common femoral artery; hemostasis achieved with manual compression. COMPLICATIONS: None immediate. TECHNIQUE: Informed written consent was obtained from the patient after a discussion of the risks, benefits and alternatives to treatment. Questions regarding the procedure were encouraged and answered. A timeout was performed prior to the initiation of the procedure. The right groin was prepped and draped in the usual sterile fashion, and a sterile drape was applied covering the operative field. Maximum barrier sterile technique with sterile gowns and gloves were used for the procedure. A timeout was performed prior to the initiation of the procedure. Local anesthesia was provided with 1% lidocaine. The right femoral head was marked fluoroscopically. Under direct ultrasound guidance, the right common femoral artery was accessed with a micropuncture kit allowing placement of a of 5-French vascular sheath. An ultrasound image was saved for documentation purposes. A limited arteriogram performed through the side arm of the sheath confirming appropriate access within the right common femoral artery. Over a BBritta Mccreedywire, a Mickelson catheter was advanced to the level of the inferior thoracic aorta where it was reformed, back bled and flushed. The Mickelson catheter was utilized to select the celiac artery and a celiac arteriogram was performed. With the use of a fathom 14  microwire, a high-flow Renegade microcatheter  was advanced into the common hepatic artery and a selective common hepatic arteriogram was performed. The microcatheter was advanced to the level of the bifurcation of the right hepatic artery and a selective right hepatic arteriogram was performed. Radioembolization was then performed with Yttrium-90 SIR Spheres. Particles were administered via a microcatheter utilizing a completely enclosed system. Monitoring of antegrade flow was performed during and after the administration under fluoroscopy with use of contrast intermittently. After the administration of the particles, the microcatheter, outer catheter and administration system were then discarded into radiation safety receptacle. At this point, the procedure was terminated. The right common femoral approach vascular sheath was removed and hemostasis was achieved with manual compression. A dressing was placed. The patient tolerated procedure well without immediate postprocedural complication. All participants involved with the procedure were surveyed by the radiation safety officer prior to exiting the fluoroscopy suite. The patient was escorted to the nuclear medicine department for post-treatment imaging. FINDINGS: Unchanged celiac arteriogram with relatively conventional anatomy. Delayed imaging demonstrates abnormal malignant vascularity involving the dome as well as the inferolateral aspect the right lobe of the liver compatible with known multifocal hepatocellular carcinoma demonstrated on preprocedural abdominal CT. The high-flow microcatheter was advanced to the level of the right hepatic artery's bifurcation at the location of the administered mapping dose of MAA. Intermittent contrast injections during and after the radioembolization confirming preserved antegrade flow without reflux. IMPRESSION: Technically successful radioembolization of the right lobe of the liver with Yttrium-90 microspheres. PLAN: - The patient will be seen in 4-5 weeks at the  interventional radiology clinic following the acquisition of a postprocedural CMP. - Pending the patient's recovery from this procedure, we will consider radioembolization of the left lobe of the liver. Electronically Signed   By: Sandi Mariscal M.D.   On: 07/09/2017 13:44   Ir Embo Tumor Organ Ischemia Infarct Inc Guide Roadmapping  Result Date: 07/09/2017 INDICATION: History of progressive multifocal hepatocellular carcinoma. Please refer to formal consultation in the epic EMR dated 06/13/2017 for additional details. Patient presents today for Y 90 radioembolization of the right lobe of the liver. EXAM: 1. CELIAC ARTERIOGRAM 2. SELECTIVE COMMON AND RIGHT HEPATIC ARTERIOGRAM 3. TRANSCATHETER YTTRIUM-90 RADIOEMBOLIZATION OF THE RIGHT LOBE OF THE LIVER 4. FLUOROSCOPIC GUIDED ADMINISTRATION OF SIR-SPHERES 5. ULTRASOUND GUIDED VASCULAR ACCESS OF THE RIGHT COMMON FEMORAL ARTERY COMPARISON:  Pre Y 90 mapping procedure - 06/27/2017; CTA of the abdomen pelvis - 06/17/2017; abdominal MRI - 04/26/2017; 01/11/2027 MEDICATIONS: Protonix 40 mg IV; Decadron 20 mg IV; Zofran 4 mg IV Zosyn 3.75 g IV; the antibiotics were administered within an appropriate time frame prior to the initiation of the procedure. CONTRAST:  40 cc Isovue-300 ANESTHESIA/SEDATION: Moderate (conscious) sedation was employed during this procedure. A total of Versed 2.5 mg and Fentanyl 150 mcg was administered intravenously. Moderate Sedation Time: 46 minutes. The patient's level of consciousness and vital signs were monitored continuously by radiology nursing throughout the procedure under my direct supervision. FLUOROSCOPY TIME:  5 minutes 6 seconds (264 mGy) ACCESS: Right common femoral artery; hemostasis achieved with manual compression. COMPLICATIONS: None immediate. TECHNIQUE: Informed written consent was obtained from the patient after a discussion of the risks, benefits and alternatives to treatment. Questions regarding the procedure were encouraged  and answered. A timeout was performed prior to the initiation of the procedure. The right groin was prepped and draped in the usual sterile fashion, and a sterile drape was applied covering the operative field. Maximum barrier sterile technique  with sterile gowns and gloves were used for the procedure. A timeout was performed prior to the initiation of the procedure. Local anesthesia was provided with 1% lidocaine. The right femoral head was marked fluoroscopically. Under direct ultrasound guidance, the right common femoral artery was accessed with a micropuncture kit allowing placement of a of 5-French vascular sheath. An ultrasound image was saved for documentation purposes. A limited arteriogram performed through the side arm of the sheath confirming appropriate access within the right common femoral artery. Over a Britta Mccreedy wire, a Mickelson catheter was advanced to the level of the inferior thoracic aorta where it was reformed, back bled and flushed. The Mickelson catheter was utilized to select the celiac artery and a celiac arteriogram was performed. With the use of a fathom 14 microwire, a high-flow Renegade microcatheter was advanced into the common hepatic artery and a selective common hepatic arteriogram was performed. The microcatheter was advanced to the level of the bifurcation of the right hepatic artery and a selective right hepatic arteriogram was performed. Radioembolization was then performed with Yttrium-90 SIR Spheres. Particles were administered via a microcatheter utilizing a completely enclosed system. Monitoring of antegrade flow was performed during and after the administration under fluoroscopy with use of contrast intermittently. After the administration of the particles, the microcatheter, outer catheter and administration system were then discarded into radiation safety receptacle. At this point, the procedure was terminated. The right common femoral approach vascular sheath was removed  and hemostasis was achieved with manual compression. A dressing was placed. The patient tolerated procedure well without immediate postprocedural complication. All participants involved with the procedure were surveyed by the radiation safety officer prior to exiting the fluoroscopy suite. The patient was escorted to the nuclear medicine department for post-treatment imaging. FINDINGS: Unchanged celiac arteriogram with relatively conventional anatomy. Delayed imaging demonstrates abnormal malignant vascularity involving the dome as well as the inferolateral aspect the right lobe of the liver compatible with known multifocal hepatocellular carcinoma demonstrated on preprocedural abdominal CT. The high-flow microcatheter was advanced to the level of the right hepatic artery's bifurcation at the location of the administered mapping dose of MAA. Intermittent contrast injections during and after the radioembolization confirming preserved antegrade flow without reflux. IMPRESSION: Technically successful radioembolization of the right lobe of the liver with Yttrium-90 microspheres. PLAN: - The patient will be seen in 4-5 weeks at the interventional radiology clinic following the acquisition of a postprocedural CMP. - Pending the patient's recovery from this procedure, we will consider radioembolization of the left lobe of the liver. Electronically Signed   By: Sandi Mariscal M.D.   On: 07/09/2017 13:44   Nm Radio Pharm Therapy Intraarterial  Result Date: 07/09/2017 CLINICAL DATA:  Cirrhotic patient with multifocal hepatocellular carcinoma primarily within the RIGHT hepatic lobe. Patient status post ablation of 2 sites in the RIGHT hepatic lobe with recurrence noted on follow-up MRI. EXAM: NUCLEAR MEDICINE SPECIAL MED RAD PHYSICS CONS; NUCLEAR MEDICINE RADIO PHARM THERAPY INTRA ARTERIAL; NUCLEAR MEDICINE TREATMENT PROCEDURE; NUCLEAR MEDICINE LIVER SCAN TECHNIQUE: In conjunction with the interventional radiologist a Y-  Microsphere dose was calculated utilizing body surface area formulation. Calculated dose equal 30.2 mCi. 12% dose reduction for cirrhosis. Pre therapy MAA liver SPECT scan and CTA were evaluated. Utilizing a microcatheter system, the RIGHT hepatic artery was selected and Y-90 microspheres were delivered in fractionated aliquots. Radiopharmaceutical was delivered by the interventional radiologist and nuclear radiologist. The patient tolerated procedure well. No adverse effects were noted. Bremsstrahlung planar and SPECT imaging of the  abdomen following intrahepatic arterial delivery of Y-90 microsphere was performed. RADIOPHARMACEUTICALS:  12.4 millicuries yttrium 90 microspheres COMPARISON:  MAA scan 06/27/2017, CT 01/04/2018. FINDINGS: Y - 90 microspheres therapy as above. First therapy the right hepatic lobe. Bremsstrahlung planar and SPECT imaging of the abdomen following intrahepatic arterial delivery of Y-64mcrosphere demonstrates radioactivity localized to the RIGHT hepatic lobe. No evidence of extrahepatic activity. IMPRESSION: Successful Y - 90 microsphere delivery for treatment of unresectable liver metastasis. First therapy to the RIGHT lobe. Bremssstrahlung scan demonstrates activity localized to RIGHT hepatic lobe with no extrahepatic activity identified. Electronically Signed   By: SSuzy BouchardM.D.   On: 07/09/2017 13:36    Labs:  CBC: Recent Labs    10/06/16 0435 01/04/17 1050 06/27/17 0745 07/09/17 0837  WBC 19.2* 10.3 10.2 10.7*  HGB 12.3* 12.7* 13.6 14.1  HCT 36.0* 38.2* 39.9 41.1  PLT 199 265 245 274    COAGS: Recent Labs    09/13/16 0942 10/04/16 1000 01/04/17 1050 06/27/17 0745 07/09/17 0837  INR 1.07 1.05 1.0 1.03 1.01  APTT 29  --   --   --   --     BMP: Recent Labs    05/16/17 1129 06/27/17 0745 07/09/17 0837 07/30/17 0000  NA 135 138 139 137  K 3.5 3.7 3.9 4.1  CL 98 104 104 101  CO2 '25 23 22 28  '$ GLUCOSE 111 117* 112* 86  BUN '7 10 13 '$ 6*    CALCIUM 9.3 8.8* 8.9 8.9  CREATININE 0.63* 0.59* 0.59* 0.48*  GFRNONAA 106 >60 >60 119  GFRAA 123 >60 >60 138    LIVER FUNCTION TESTS: Recent Labs    11/01/16 1034 01/04/17 1050 05/16/17 1129 06/27/17 0745 07/09/17 0837 07/30/17 0000  BILITOT 0.7 0.5 0.6 1.0 1.2 0.7  AST 58* 64* 84* 117* 114* 99*  ALT 31 25 34 44 44 34  ALKPHOS 239* 215*  --  244* 266*  --   PROT 7.3 7.3 7.2 7.8 8.0 7.2  ALBUMIN 3.4* 3.4*  --  3.5 3.6  --     TUMOR MARKERS: Recent Labs    09/24/16 0959 04/19/17 1436  AFPTM 5.2 8.7*    Assessment and Plan:  RKERT SHACKETTis a 62y.o. male with past history significant for hypertension, smoking, hepatitis C, alcoholic cirrhosis and multifocal hepatocellular carcinoma who underwent tentatively successful bland embolization to discrete hepatic lesions on 09/13/2016 followed by ultrasound and CT-guided microwave ablation on 10/05/2016 who was found to have progression of multicentric disease on six-month follow-up surveillance MRI performed 04/26/2017.  As such, the patient underwent Y-90 mapping procedure on 06/27/2017 and subsequent Y-90 embolization of the right lobe of the liver on 07/09/2017.   While patient's liver function tests remain stable, unfortunately, the patient appears to continue his recovery following the embolization of the right lobe the liver as he continues to complain of fatigue, decreased appetite and energy level.  Selected images from abdominal MRI performed 04/26/2017 and 01/10/2017 as well as Y 90 mapping procedure performed 06/27/2017 and Y 90 radioembolization of the right lobe of the liver performed 07/09/2017 were reviewed in detail with the patient and the patient's family.  Risks and benefits of proceeding with Y 90 embolization of the left lobe of the liver was discussed in detail with the patient and the patient's family. I explained that the majority of the patient's disease resides within the right lobe of the liver which has already  been embolized.  Following prolonged and detailed  conversation with the patient, the patient wishes to delay the Y 90 radioembolization of the left lobe of the liver and rather to pursue surveillance MRI at the three-month mark following the treatment of the right lobe.  Given the rapidity of disease progression between the August and December MRIs as well as the patient's slow recovery from the right sided treatment, I feel holding off the left sided treatment is very reasonable.    As such, the patient will be seen in consultation following the acquisition of surveillance MRI to be scheduled in late May, early June.   Patient was encouraged to call the interventional radiology clinic with any interval questions or concerns.  A copy of this report was sent to the requesting provider on this date.  Electronically Signed: Sandi Mariscal 08/01/2017, 10:43 AM   I spent a total of 40 Minutes in face to face in clinical consultation, greater than 50% of which was counseling/coordinating care for multifocal [facility]

## 2017-08-13 ENCOUNTER — Other Ambulatory Visit (HOSPITAL_COMMUNITY): Payer: BLUE CROSS/BLUE SHIELD

## 2017-08-13 ENCOUNTER — Ambulatory Visit (HOSPITAL_COMMUNITY): Payer: BLUE CROSS/BLUE SHIELD

## 2017-08-13 ENCOUNTER — Encounter (HOSPITAL_COMMUNITY): Payer: Self-pay

## 2017-08-23 ENCOUNTER — Telehealth: Payer: Self-pay | Admitting: Family Medicine

## 2017-08-23 NOTE — Telephone Encounter (Signed)
Ask Dr. Elease Hashimoto if he agreed to see pt/mom/dad too?

## 2017-08-24 ENCOUNTER — Other Ambulatory Visit: Payer: Self-pay | Admitting: Internal Medicine

## 2017-08-27 NOTE — Telephone Encounter (Signed)
Per Dr. Elease Hashimoto he will accept pateint, not sure if they need appointment or not?

## 2017-09-03 ENCOUNTER — Other Ambulatory Visit: Payer: Self-pay | Admitting: Nurse Practitioner

## 2017-09-03 DIAGNOSIS — K7469 Other cirrhosis of liver: Secondary | ICD-10-CM

## 2017-09-03 DIAGNOSIS — R188 Other ascites: Secondary | ICD-10-CM

## 2017-09-12 ENCOUNTER — Ambulatory Visit
Admission: RE | Admit: 2017-09-12 | Discharge: 2017-09-12 | Disposition: A | Discharge status: Home / Self Care | Payer: BLUE CROSS/BLUE SHIELD | Source: Ambulatory Visit | Attending: Nurse Practitioner | Admitting: Nurse Practitioner

## 2017-09-12 ENCOUNTER — Other Ambulatory Visit: Payer: Self-pay | Admitting: Nurse Practitioner

## 2017-09-12 DIAGNOSIS — R188 Other ascites: Secondary | ICD-10-CM

## 2017-09-12 DIAGNOSIS — K7469 Other cirrhosis of liver: Secondary | ICD-10-CM

## 2017-09-27 ENCOUNTER — Other Ambulatory Visit: Payer: Self-pay | Admitting: Internal Medicine

## 2017-09-30 ENCOUNTER — Other Ambulatory Visit: Payer: Self-pay | Admitting: Radiology

## 2017-09-30 ENCOUNTER — Other Ambulatory Visit (HOSPITAL_COMMUNITY): Payer: Self-pay | Admitting: Interventional Radiology

## 2017-09-30 DIAGNOSIS — C220 Liver cell carcinoma: Secondary | ICD-10-CM

## 2017-10-02 ENCOUNTER — Telehealth: Payer: Self-pay | Admitting: Hematology

## 2017-10-02 NOTE — Telephone Encounter (Signed)
Spoke w/ pt re sch change from 6/17 to 6/18 @ 9 AM.

## 2017-10-08 ENCOUNTER — Other Ambulatory Visit (HOSPITAL_COMMUNITY): Payer: Self-pay | Admitting: Nurse Practitioner

## 2017-10-08 DIAGNOSIS — R188 Other ascites: Secondary | ICD-10-CM

## 2017-10-10 ENCOUNTER — Ambulatory Visit (HOSPITAL_COMMUNITY)
Admission: RE | Admit: 2017-10-10 | Discharge: 2017-10-10 | Disposition: A | Discharge status: Home / Self Care | Payer: BLUE CROSS/BLUE SHIELD | Source: Ambulatory Visit | Attending: Nurse Practitioner | Admitting: Nurse Practitioner

## 2017-10-10 ENCOUNTER — Encounter (HOSPITAL_COMMUNITY): Payer: Self-pay | Admitting: Radiology

## 2017-10-10 DIAGNOSIS — K7460 Unspecified cirrhosis of liver: Secondary | ICD-10-CM | POA: Diagnosis not present

## 2017-10-10 DIAGNOSIS — R188 Other ascites: Secondary | ICD-10-CM | POA: Diagnosis not present

## 2017-10-10 HISTORY — PX: IR PARACENTESIS: IMG2679

## 2017-10-10 LAB — COMPLETE METABOLIC PANEL WITH GFR
AG Ratio: 0.7 (calc) — ABNORMAL LOW (ref 1.0–2.5)
ALBUMIN MSPROF: 3.1 g/dL — AB (ref 3.6–5.1)
ALT: 21 U/L (ref 9–46)
AST: 53 U/L — ABNORMAL HIGH (ref 10–35)
Alkaline phosphatase (APISO): 293 U/L — ABNORMAL HIGH (ref 40–115)
BUN / CREAT RATIO: 21 (calc) (ref 6–22)
BUN: 12 mg/dL (ref 7–25)
CHLORIDE: 92 mmol/L — AB (ref 98–110)
CO2: 27 mmol/L (ref 20–32)
Calcium: 8.8 mg/dL (ref 8.6–10.3)
Creat: 0.57 mg/dL — ABNORMAL LOW (ref 0.70–1.25)
GFR, EST AFRICAN AMERICAN: 128 mL/min/{1.73_m2} (ref >=60)
GFR, Est Non African American: 111 mL/min/{1.73_m2} (ref >=60)
GLUCOSE: 132 mg/dL (ref 65–139)
Globulin: 4.2 g/dL (calc) — ABNORMAL HIGH (ref 1.9–3.7)
Potassium: 3.7 mmol/L (ref 3.5–5.3)
Sodium: 128 mmol/L — ABNORMAL LOW (ref 135–146)
TOTAL PROTEIN: 7.3 g/dL (ref 6.1–8.1)
Total Bilirubin: 0.8 mg/dL (ref 0.2–1.2)

## 2017-10-10 LAB — PROTIME-INR
INR: 1
Prothrombin Time: 10.9 s (ref 9.0–11.5)

## 2017-10-10 LAB — CBC
HEMATOCRIT: 38.2 % — AB (ref 38.5–50.0)
Hemoglobin: 13.3 g/dL (ref 13.2–17.1)
MCH: 31.8 pg (ref 27.0–33.0)
MCHC: 34.8 g/dL (ref 32.0–36.0)
MCV: 91.4 fL (ref 80.0–100.0)
MPV: 9.9 fL (ref 7.5–12.5)
Platelets: 313 10*3/uL (ref 140–400)
RBC: 4.18 10*6/uL — ABNORMAL LOW (ref 4.20–5.80)
RDW: 13.6 % (ref 11.0–15.0)
WBC: 12.1 10*3/uL — ABNORMAL HIGH (ref 3.8–10.8)

## 2017-10-10 LAB — BODY FLUID CELL COUNT WITH DIFFERENTIAL
Eos, Fluid: 0 %
LYMPHS FL: 10 %
Monocyte-Macrophage-Serous Fluid: 85 % (ref 50–90)
Neutrophil Count, Fluid: 5 % (ref 0–25)
Total Nucleated Cell Count, Fluid: 574 cu mm (ref 0–1000)

## 2017-10-10 LAB — AFP TUMOR MARKER: AFP-Tumor Marker: 12.6 ng/mL — ABNORMAL HIGH (ref <6.1)

## 2017-10-10 MED ORDER — LIDOCAINE HCL (PF) 1 % IJ SOLN
INTRAMUSCULAR | Status: DC | PRN
  Administered 2017-10-10: 10 mL

## 2017-10-10 MED ORDER — LIDOCAINE HCL (PF) 2 % IJ SOLN
INTRAMUSCULAR | Status: AC
  Filled 2017-10-10: qty 20

## 2017-10-10 NOTE — Procedures (Signed)
PROCEDURE SUMMARY:  Successful US guided paracentesis from RLQ.  Yielded 5.7 L of clear yellow fluid.  No immediate complications.  Pt tolerated well.   Specimen was sent for labs.  Ascencion Dike PA-C 10/10/2017 11:13 AM

## 2017-10-11 ENCOUNTER — Other Ambulatory Visit (HOSPITAL_COMMUNITY): Payer: Self-pay | Admitting: Interventional Radiology

## 2017-10-11 LAB — PATHOLOGIST SMEAR REVIEW

## 2017-10-16 ENCOUNTER — Ambulatory Visit
Admission: RE | Admit: 2017-10-16 | Discharge: 2017-10-16 | Disposition: A | Discharge status: Home / Self Care | Payer: BLUE CROSS/BLUE SHIELD | Source: Ambulatory Visit | Attending: Interventional Radiology | Admitting: Interventional Radiology

## 2017-10-16 ENCOUNTER — Encounter: Payer: Self-pay | Admitting: Radiology

## 2017-10-16 ENCOUNTER — Ambulatory Visit (HOSPITAL_COMMUNITY)
Admission: RE | Admit: 2017-10-16 | Discharge: 2017-10-16 | Disposition: A | Discharge status: Home / Self Care | Payer: BLUE CROSS/BLUE SHIELD | Source: Ambulatory Visit | Attending: Interventional Radiology | Admitting: Interventional Radiology

## 2017-10-16 DIAGNOSIS — K8020 Calculus of gallbladder without cholecystitis without obstruction: Secondary | ICD-10-CM | POA: Diagnosis not present

## 2017-10-16 DIAGNOSIS — C220 Liver cell carcinoma: Secondary | ICD-10-CM | POA: Diagnosis not present

## 2017-10-16 DIAGNOSIS — K7460 Unspecified cirrhosis of liver: Secondary | ICD-10-CM | POA: Insufficient documentation

## 2017-10-16 HISTORY — PX: IR RADIOLOGIST EVAL & MGMT: IMG5224

## 2017-10-16 MED ORDER — GADOBENATE DIMEGLUMINE 529 MG/ML IV SOLN
15.0000 mL | Freq: Once | INTRAVENOUS | Status: AC | PRN
  Administered 2017-10-16: 14 mL via INTRAVENOUS

## 2017-10-16 NOTE — Progress Notes (Signed)
Patient ID: Joshua [name], male   DOB: Nov 03, 1955, 62 y.o.   MRN: [id number]        Chief Complaint: Multifocal hepatocellular carcinoma  Referring Physician(s): Burr Medico (Oncology) Burnice Logan (PCP) Drazek (Hepatobilliary)  History of Present Illness: Joshua [name] is a 62 y.o. male with past medical history significant for hypertension, smoking, hepatitis C, alcoholic cirrhosis and multifocal hepatocellular carcinoma post initial bland and subsequent image guided microwave ablation performed on 09/13/2016 and 10/05/2016 who was found to have progression of multicentric disease on six-month follow-up abdominal MRI for which patient ultimately underwent a successful radioembolization of the right lobe of the liver on 07/09/2017.  The decision was ultimately made NOT to pursue radioembolization of the left lobe of the liver secondary to patient's persistent complaint of fatigue, decreased appetite and energy level.  Patient presents today following the acquisition of 3 months post procedural abdominal MRI.  He is again accompanied by his mother though serves as his own historian.  Since last being seen in consultation, patient has been evaluated by Dr. Beverley Fiedler secondary to development of intra-abdominal ascites for which the patient underwent ultrasound-guided paracentesis on 10/10/2017 yielding approximately 5.7 L of peritoneal fluid.  Patient states he has been tolerating his diuretic medicine poorly, stating that have caused constipation, and as such has stopped taking them.  Patient states his energy level and appetite remains poor.  He also complains of left lower extremity pain and edema though states this is currently resolved.  No definitive complaints of encephalopathy though patient has baseline somewhat low level of understanding.  No hematemesis.  No bloody or melanotic stools.     Past Medical History:  Diagnosis Date  . Cancer ([place])   . Cataract   . Chronic alcoholism ([place])   .  Cirrhosis ([place])   . Depression   . Glucose intolerance (impaired glucose tolerance)   . Hepatitis C   . Hypertension   . Tobacco use   . Umbilical hernia     Past Surgical History:  Procedure Laterality Date  . CATARACT EXTRACTION    . COLONOSCOPY  12/09  . INGUINAL HERNIA REPAIR    . IR 3D INDEPENDENT WKST  09/13/2016  . IR [place] ADDITIONAL VESSEL  09/13/2016  . IR [place] ADDITIONAL VESSEL  09/13/2016  . IR [place] ADDITIONAL VESSEL  09/13/2016  . IR [place] ADDITIONAL VESSEL  09/13/2016  . IR [place] ADDITIONAL VESSEL  09/13/2016  . IR ANGIOGRAM SELECTIVE EACH ADDITIONAL VESSEL  06/27/2017  . IR ANGIOGRAM SELECTIVE EACH ADDITIONAL VESSEL  06/27/2017  . IR ANGIOGRAM SELECTIVE EACH ADDITIONAL VESSEL  06/27/2017  . IR ANGIOGRAM SELECTIVE EACH ADDITIONAL VESSEL  07/09/2017  . IR ANGIOGRAM SELECTIVE EACH ADDITIONAL VESSEL  07/09/2017  . IR ANGIOGRAM VISCERAL SELECTIVE  09/13/2016  . IR ANGIOGRAM VISCERAL SELECTIVE  06/27/2017  . IR ANGIOGRAM VISCERAL SELECTIVE  06/27/2017  . IR ANGIOGRAM VISCERAL SELECTIVE  07/09/2017  . IR EMBO TUMOR ORGAN ISCHEMIA INFARCT INC GUIDE ROADMAPPING  09/13/2016  . IR EMBO TUMOR ORGAN ISCHEMIA INFARCT INC GUIDE ROADMAPPING  07/09/2017  . IR PARACENTESIS  10/10/2017  . IR RADIOLOGIST EVAL & MGMT  08/21/2016  . IR RADIOLOGIST EVAL & MGMT  08/30/2016  . IR RADIOLOGIST EVAL & MGMT  09/26/2016  . IR RADIOLOGIST EVAL & MGMT  01/10/2017  . IR RADIOLOGIST EVAL & MGMT  11/06/2016  . IR RADIOLOGIST EVAL & MGMT  05/16/2017  . IR RADIOLOGIST EVAL &  MGMT  06/13/2017  . IR RADIOLOGIST EVAL & MGMT  08/01/2017  . IR RADIOLOGIST EVAL & MGMT  10/16/2017  . IR US GUIDE VASC ACCESS RIGHT  09/13/2016  . IR US GUIDE VASC ACCESS RIGHT  06/27/2017  . IR US GUIDE VASC ACCESS RIGHT  07/09/2017  . left orchiectomy  07/2009  . RADIOFREQUENCY ABLATION N/A 10/05/2016   Procedure: LIVER MICROWAVE THERMAL ABLATION;  Surgeon: Sandi Mariscal, MD;  Location: [facility];  Service: Anesthesiology;  Laterality: N/A;  . UMBILICAL HERNIA REPAIR      Allergies: Patient has no known allergies.  Medications: Prior to Admission medications   Medication Sig Start Date End Date Taking? Authorizing Provider  acetaminophen (TYLENOL) 325 MG tablet Take 650 mg by mouth every 6 (six) hours as needed (for headaches.).   Yes [provider]  b complex vitamins capsule Take 1 capsule by mouth daily.   Yes [provider]  lisinopril-hydrochlorothiazide (PRINZIDE,ZESTORETIC) 10-12.5 MG tablet Take 1 tablet by mouth daily. 02/28/17  Yes Marletta Lor, MD  Multiple Vitamin (MULTIVITAMIN WITH MINERALS) TABS tablet Take 1 tablet by mouth daily.   Yes [provider]  traZODone (DESYREL) 50 MG tablet TAKE 2 TABLETS BY MOUTH EVERY NIGHT AT BEDTIME 10/01/17  Yes Marletta Lor, MD  sertraline (ZOLOFT) 100 MG tablet TAKE 1 TABLET BY MOUTH EVERY DAY Patient not taking: Reported on 10/16/2017 04/30/17   Marletta Lor, MD     Family History  Problem Relation Age of Onset  . Healthy Mother   . Colon cancer Neg Hx   . Stomach cancer Neg Hx   . Esophageal cancer Neg Hx   . Rectal cancer Neg Hx     Social History   Socioeconomic History  . Marital status: Single    Spouse name: Not on file  . Number of children: Not on file  . Years of education: Not on file  . Highest education level: Not on file  Occupational History  . Not on file  Social Needs  . Financial resource strain: Not on file  . Food insecurity:    Worry: Not on file    Inability: Not on file  . Transportation needs:    Medical: Not on file    Non-medical: Not on file  Tobacco Use  . Smoking status: Current Every Day Smoker    Packs/day: 0.50    Years: 42.00    Pack years: 21.00    Types: Cigarettes    Start date: 05/21/1971  . Smokeless tobacco: Former Systems developer  . Tobacco comment: Has cut down to about 1/3 pack per day  Substance  and Sexual Activity  . Alcohol use: No    Alcohol/week: 16.8 oz    Types: 28 Cans of beer per week    Comment: hx of nothing current   . Drug use: No  . Sexual activity: Not on file  Lifestyle  . Physical activity:    Days per week: Not on file    Minutes per session: Not on file  . Stress: Not on file  Relationships  . Social connections:    Talks on phone: Not on file    Gets together: Not on file    Attends religious service: Not on file    Active member of club or organization: Not on file    Attends meetings of clubs or organizations: Not on file    Relationship status: Not on file  Other Topics Concern  . Not  on file  Social History Narrative  . Not on file    ECOG Status: 2 - Symptomatic, <50% confined to bed  Review of Systems: A 12 point ROS discussed and pertinent positives are indicated in the HPI above.  All other systems are negative.  Review of Systems  Constitutional: Positive for appetite change and fatigue. Negative for activity change and fever.  Respiratory: Negative.   Gastrointestinal: Positive for constipation and nausea. Negative for blood in stool.  Skin: Negative.   Psychiatric/Behavioral: Negative for confusion.    Vital Signs: BP 97/68   Pulse 82   Temp 97.8 F (36.6 C) (Oral)   Resp 14   Ht 5\' 11"  (1.803 m)   Wt 140 lb (63.5 kg)   SpO2 100%   BMI 19.53 kg/m   Physical Exam  Constitutional:  Appears older than his stated age.  Skin:  Bilateral upper extremities have a blue pigmentation  Psychiatric: He has a normal mood and affect. His behavior is normal.     Imaging:  Selected images from abdominal MRI performed 10/16/2017; 04/26/2018 and abdominal CT performed 06/17/2017 reviewed in detail.  Mr Abdomen Wwo Contrast  Result Date: 10/16/2017 CLINICAL DATA:  Multifocal hepatocellular carcinoma. Status post multiple therapies, most recently Y 90 embolization of the right hepatic lobe on 07/09/2017. EXAM: MRI ABDOMEN WITHOUT AND  WITH CONTRAST TECHNIQUE: Multiplanar multisequence MR imaging of the abdomen was performed both before and after the administration of intravenous contrast. CONTRAST:  25mL MULTIHANCE GADOBENATE DIMEGLUMINE 529 MG/ML IV SOLN COMPARISON:  CT scan 06/17/2017.  MRI 04/26/2017. FINDINGS: Lower chest: Unremarkable. Hepatobiliary: Cirrhotic liver morphology again noted with marked enlargement of the caudate lobe. Ablation defect near the dome of liver appears similar. An area of arterial hyperenhancement identified along the cranial margin of this ablation defect was previously measured at 2.8 cm. This hypervascular lesion is stable at 2.8 cm today (image 14 of the arterial phase T1 gradient sequence, series 1101). 2nd ablation zone identified inferior right liver (reportedly segment VI). Previous exam demonstrated no arterial phase enhancement anterior to this defect although on today's exam, there is clear hyperenhancement anterior to the defect on image 48/series 11/01. While a 2.2 cm hypervascular lesion contiguous medially with the cranial margin of this ablation defect is unchanged, there is a new 2.3 cm hypervascular lesion just medial (image 44/series 11/01). The multi lobular large area of arterial phase enhancement measured on the previous study at 7.2 cm is similar today at 7.1 cm (36/1101). The relatively diffuse multifocal disease in the posterior right liver maintains a generalized nodular configuration and on more delayed postcontrast imaging shows washout to a greater degree than background liver and retains areas of capsular enhancement. Tiny foci of new hyperenhancement are identified in the left liver (lateral segment image 31/1101, medial segment image 22/1101, medial segment image 28/1101). Given the marked background nodularity of the liver parenchyma, assessment of washout in these tiny subcentimeter lesions is limited, but new foci hepatocellular carcinoma a distinct concern. Gallstone again  identified. No intra or extrahepatic biliary duct dilatation. Pancreas: No focal mass lesion. No dilatation of the main duct. No intraparenchymal cyst. No peripancreatic edema. Spleen:  No splenomegaly. No focal mass lesion. Adrenals/Urinary Tract: No adrenal nodule or mass. Kidneys are unremarkable. Stomach/Bowel: Stomach is nondistended. No dilated small bowel or colon within the visualized upper abdomen. Vascular/Lymphatic: No abdominal aortic aneurysm. Portal vein and superior mesenteric vein are patent. Splenic vein is patent. Recanalization of the paraumbilical vein is compatible with  portal venous hypertension. 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 There is no gastrohepatic or hepatoduodenal ligament lymphadenopathy. No intraperitoneal or retroperitoneal lymphadenopathy. Other:  Moderate volume ascites. Musculoskeletal: No abnormal marrow enhancement within the visualized bony anatomy. IMPRESSION: 1. Mild progression of multifocal hepatocellular carcinoma in the cirrhotic liver. Disease is predominantly in the right hepatic lobe and appears relatively stable although new foci of hypervascular lesions are noted medial and cranial to the segment VI ablation defect with washout characteristics compatible with HCC. Tiny new foci of early hyperenhancement in the left liver also concerning for additional sites of new disease. 2. Cholelithiasis. Electronically Signed   By: Misty Stanley M.D.   On: 10/16/2017 11:20   Ir Radiologist Eval & Mgmt  Result Date: 10/16/2017 Please refer to notes tab for details about interventional procedure. (Op Note)  Ir Paracentesis  Result Date: 10/10/2017 INDICATION: Cirrhosis. Abdominal distention. Ascites. Request diagnostic and therapeutic paracentesis. EXAM: ULTRASOUND GUIDED RIGHT LOWER QUADRANT PARACENTESIS MEDICATIONS: None. COMPLICATIONS: None immediate. PROCEDURE: Informed written consent was obtained from the patient after a discussion of the risks,  benefits and alternatives to treatment. A timeout was performed prior to the initiation of the procedure. Initial ultrasound scanning demonstrates a large amount of ascites within the right lower abdominal quadrant. The right lower abdomen was prepped and draped in the usual sterile fashion. 1% lidocaine with epinephrine was used for local anesthesia. Following this, a 19 gauge, 10-cm, Yueh catheter was introduced. An ultrasound image was saved for documentation purposes. The paracentesis was performed. The catheter was removed and a dressing was applied. The patient tolerated the procedure well without immediate post procedural complication. FINDINGS: A total of approximately 5.7 L of clear yellow fluid was removed. Samples were sent to the laboratory as requested by the clinical team. IMPRESSION: Successful ultrasound-guided paracentesis yielding 5.7 liters of peritoneal fluid. Read by: Ascencion Dike PA-C Electronically Signed   By: Jacqulynn Cadet M.D.   On: 10/10/2017 11:24    Labs:  CBC: Recent Labs    01/04/17 1050 06/27/17 0745 07/09/17 0837 10/09/17 1159  WBC 10.3 10.2 10.7* 12.1*  HGB 12.7* 13.6 14.1 13.3  HCT 38.2* 39.9 41.1 38.2*  PLT 265 245 274 313    COAGS: Recent Labs    01/04/17 1050 06/27/17 0745 07/09/17 0837 10/09/17 1159  INR 1.0 1.03 1.01 1.0    BMP: Recent Labs    06/27/17 0745 07/09/17 0837 07/30/17 0000 10/09/17 1159  NA 138 139 137 128*  K 3.7 3.9 4.1 3.7  CL 104 104 101 92*  CO2 23 22 28 27   GLUCOSE 117* 112* 86 132  BUN 10 13 6* 12  CALCIUM 8.8* 8.9 8.9 8.8  CREATININE 0.59* 0.59* 0.48* 0.57*  GFRNONAA >60 >60 119 111  GFRAA >60 >60 138 128    LIVER FUNCTION TESTS: Recent Labs    11/01/16 1034 01/04/17 1050  06/27/17 0745 07/09/17 0837 07/30/17 0000 10/09/17 1159  BILITOT 0.7 0.5   < > 1.0 1.2 0.7 0.8  AST 58* 64*   < > 117* 114* 99* 53*  ALT 31 25   < > 44 44 34 21  ALKPHOS 239* 215*  --  244* 266*  --   --   PROT 7.3 7.3   < >  7.8 8.0 7.2 7.3  ALBUMIN 3.4* 3.4*  --  3.5 3.6  --   --    < > = values in this interval not displayed.    TUMOR MARKERS: Recent Labs  04/19/17 1436 10/09/17 1159  AFPTM 8.7* 12.6*    Assessment and Plan:  ESIAS MORY is a 62 y.o. male with past medical history significant for hypertension, smoking, hepatitis C, alcoholic cirrhosis and multifocal hepatocellular carcinoma post initial bland and subsequent image guided microwave ablation performed on 09/13/2016 and 10/05/2016 who was found to have progression of multicentric disease on six-month follow-up abdominal MRI for which patient ultimately underwent a successful radioembolization of the right lobe of the liver on 07/09/2017.    Selected images from abdominal MRI performed 10/16/2017; 04/26/2018 and abdominal CT performed 06/17/2017 reviewed in detail.  Unfortunately, thought not surprisingly, surveillance abdominal MRI performed earlier today demonstrates continued slight progression of multifocal hepatocellular carcinoma asymmetrically involving the right lobe of the liver, along with the presence of recurrent small to moderate volume intra-abdominal ascites.  Despite the persistent normalization of the patient's bilirubin level, the presence of ascites precludes continued hepatic directed therapy and his overall prognosis remains poor.  This was discussed at great length with the patient and the patient's mother who demonstrated good understanding.  As requested by the family, I filled out the necessary paperwork for the patient to apply for medical disability.  The patient was encouraged to maintain follow-up appointments with both [facility] as well as Dr. Burr Medico however may otherwise return to the interventional radiology clinic on a as needed basis.  A copy of this report was sent to the requesting provider on this date.  Electronically Signed: Sandi Mariscal 10/16/2017, 12:46 PM   I spent a total of 15 Minutes in face to face  in clinical consultation, greater than 50% of which was counseling/coordinating care for multifocal hepatocellular carcinoma.

## 2017-10-23 ENCOUNTER — Other Ambulatory Visit (HOSPITAL_COMMUNITY): Payer: Self-pay | Admitting: Nurse Practitioner

## 2017-10-23 DIAGNOSIS — R188 Other ascites: Secondary | ICD-10-CM

## 2017-10-25 NOTE — Progress Notes (Signed)
[facility]  [phone] [phone]  Clinic Follow Up Note   Patient Care Team: Marletta Lor, MD as PCP - General Danis, Kirke Corin, MD as Consulting Physician (Gastroenterology) Sandi Mariscal, MD as Consulting Physician (Interventional Radiology) Truitt Merle, MD as Consulting Physician (Hematology)   Date of Service:  10/29/2017   CHIEF COMPLAINTS:  Follow up hepatocellular carcinoma  Oncology History   Cancer Staging Hepatocellular carcinoma [facility]) Staging form: Liver, AJCC 8th Edition - Clinical stage from 08/01/2016: Stage II (cT2(m), cN0, cM0) - Signed by Truitt Merle, MD on 08/24/2016       Hepatocellular carcinoma ([place])   06/28/2016 Tumor Marker    AFP 5.3      07/12/2016 Imaging    US Abdomen 07/12/16 IMPRESSION: 1. There is shadowing gallstone within gallbladder measures 1.6 cm. No sonographic Murphy's sign. No thickening of gallbladder wall. Normal CBD. 2. Again noted heterogeneous increased echogenicity of the liver with nodular contour consistent with cirrhosis. There is hypoechoic lesion in right hepatic lobe measures 1.7 x 2.4 cm. There is a second hypoechoic lesion in inferior aspect of the right hepatic lobe measures 4.6 x 3.6 cm. This is ill defined. Further evaluation with enhanced MRI is recommended to exclude evolving hepatic masses. 3. No hydronephrosis or renal calculi. 4. No aortic aneurysm.      07/16/2016 Imaging    MRI Abdomen w wo Contrast 07/16/16 IMPRESSION: 1. Two enhancing lesions in the RIGHT hepatic lobe on the background of hepatitis-C and liver cirrhosis are consistent with hepatocellular carcinoma. Recommend multidisciplinary GI, surgical and oncological consultation. 2. Morphologic changes of cirrhosis. 3. No ascites.  Patent portal veins.      07/16/2016 Initial Diagnosis    Hepatocellular carcinoma ([place])      09/13/2016 Procedure    Bland liver embolization before microwave ablation, Dr. Pascal Lux         10/05/2016 Surgery    LIVER MICROWAVE THERMAL ABLATION by Dr. Pascal Lux  10/05/16      01/10/2017 Imaging    MRI Abdomen W WO Contrast 01/10/17 IMPRESSION: 1. Expected postprocedural changes from ablation of 2 hepatic masses. No complicating features are demonstrated. No findings suspicious for residual or recurrent tumor. 2. 10 mm early arterial phase enhancing nodule in the right hepatic lobe posteriorly could be a dysplastic nodule or early [place]. Recommend continued surveillance. 3. Severe cirrhotic changes involving the liver with areas of confluent hepatic fibrosis and markedly enlarged caudate lobe. Stable portal venous collaterals, portal venous hypertension and esophageal varices. No splenomegaly or ascites. 4. Stable cholelithiasis.       04/26/2017 Imaging    MRI Abdomen W Wo Contrast 04/26/17 IMPRESSION: 1. Although the ablation zones along the dome of liver in segment 6 of the right lobe demonstrate interval decrease in size from previous exam there are multiple new arterial phase enhancing lesions involving both lobes of liver worrisome for recurrent multicentric HCC. 2.  Aortic Atherosclerosis (ICD10-I70.0).      04/29/2017 Procedure    Upper Endoscopy by Dr. Pincus Sanes 04/29/17 IMPRESSION - Normal larynx. - Normal esophagus. - Portal hypertensive gastropathy. Biopsied. - Normal examined duodenum.      05/30/2017 Imaging    CT Chest 05/30/17 IMPRESSION: 1. Slow enlargement of RIGHT upper lobe ground-glass nodule. Recommend follow-up CT without contrast 12 months. 2. Mild increase in prominence of 4 mm solid and semi-solid nodule in the RIGHT lower lobe. Recommend follow-up in 12 months as above. Pulmonary nodules not favored metastatic but primary bronchogenic  adenocarcinoma cannot be excluded. Recommendations for the Management of Subsolid Pulmonary Nodules Detected at CT: A Statement from the [facility] 2013; 266:1, [phone]. 3. Nodular  cirrhotic liver treatment sites noted Aortic Atherosclerosis (ICD10-I70.0).      06/2017 Procedure    He underwent Y90 treatment on 06/27/17 and 07/09/17 by Dr. Pascal Lux      10/16/2017 Imaging    MRI Abdomen 10/16/17 IMPRESSION: 1. Mild progression of multifocal hepatocellular carcinoma in the cirrhotic liver. Disease is predominantly in the right hepatic lobe and appears relatively stable although new foci of hypervascular lesions are noted medial and cranial to the segment VI ablation defect with washout characteristics compatible with HCC. Tiny new foci of early hyperenhancement in the left liver also concerning for additional sites of new disease. 2. Cholelithiasis.       Chemotherapy    PENDING Lenvatinib 4mg  week one, 8mg  week 2 and 3 and 12mg  week 4       HISTORY OF PRESENTING ILLNESS (08/24/2016):  Joshua [name] 62 y.o. male is here because of a new diagnosis of hepatocellular carcinoma.  The patient had an US of the abdomen on 07/12/16 for a personal history of chronic hepatitis-C and alcoholism complicated by cirrhosis. This a showed a gallstone in the gallbladder measuring 1.6 cm, heterogeneous increased echogenicity of the liver with nodular contour consistent with cirrhosis, a hypoechoic lesion in right hepatic lobe measuring 1.7 x 2.4 cm, and a second ill-defined hypoechoic lesion in inferior aspect of the right hepatic lobe measuring  4.6 x 3.6 cm.  MRI of the abdomen on 07/16/16 showed two enhancing lesions in the right hepatic lobe (1.7 cm and 1.9 cm)  in the background of hepatitis-C and liver cirrhosis consistent with hepatocellular carcinoma. These findings are associated with a mildly elevated AFT level of 5.3 obtained on 06/28/16.  The patient was referred to Dr. Pascal Lux of IR on 08/21/16 to discuss treatment options. He explained that the goal standard is surgical resection and he may benefit from input from Dr. Barry Dienes regarding operative candidacy. If he is not an operative  candidate,conversations were held with the patient regarding potential percutaneous treatment options; such as microwave ablation and transcatheter embolization. Dr. Pascal Lux recommended cirrhotic protocol CTA of the abd/pelvis to better delinate the patient's hepatic arterial supply and determine the true size of the ill-defined lesion in the caudal aspect of the right hepatic lobe. He will return afterwards to Dr. Pascal Lux to further discuss his treatment plan. The patient's case was also discussed with [name], NP who wished the patient to undergo definitive hepatocellular carcinoma treatment prior to the hepatitis-C treatment.  The patient presents today with his mother and Dr. Elta Guadeloupe, his step-father, to discuss possible systemic treatments for the management of his disease. He denies nausea, bloating, changes in bowel habits, or abdominal pain. He used Trazodone to sleep. He denies hematemesis or hematochezia. The patient states his hep-C was an incidental finding. He reports easily bruising.   CURRENT THERAPY: PENDING Lenvatinib 4mg  week one, 8mg  week 2 and 3 and 12mg  week 4   INTERVAL HISTORY  Joshua [name] is here for a follow up post Y90 treatment. He was last seen by me 5 months ago. He had completed Y90 treatment in 06/2017. He saw Dr. Pascal Lux on 10/16/17 to review his MRI which showed disease progression and ascites. He underwent paracentesis on May 30th and yesterday. He presents to the clinic today accompanied by his family member.   He notes [employer] started him on two medications lasix and spironolactone. He notes he took BP medication, HCTZ and Lisinopril late today and his BP dropped to 85/60. He plans to see Dawn on 11/27/17.   On review of symptoms, pt notes his BMs have improved. He notes his left anterior lower leg hurts significantly from cramps and occasional swelling. He currently denies N&V and has no abdominal pain currently.     MEDICAL HISTORY:  Past Medical History:    Diagnosis Date  . Cancer ([place])   . Cataract   . Chronic alcoholism ([place])   . Cirrhosis ([place])   . Depression   . Glucose intolerance (impaired glucose tolerance)   . Hepatitis C   . Hypertension   . Tobacco use   . Umbilical hernia     SURGICAL HISTORY: Past Surgical History:  Procedure Laterality Date  . CATARACT EXTRACTION    . COLONOSCOPY  12/09  . INGUINAL HERNIA REPAIR    . IR 3D INDEPENDENT WKST  09/13/2016  . IR [place] ADDITIONAL VESSEL  09/13/2016  . IR [place] ADDITIONAL VESSEL  09/13/2016  . IR [place] ADDITIONAL VESSEL  09/13/2016  . IR [place] ADDITIONAL VESSEL  09/13/2016  . IR [place] ADDITIONAL VESSEL  09/13/2016  . IR ANGIOGRAM SELECTIVE EACH ADDITIONAL VESSEL  06/27/2017  . IR ANGIOGRAM SELECTIVE EACH ADDITIONAL VESSEL  06/27/2017  . IR ANGIOGRAM SELECTIVE EACH ADDITIONAL VESSEL  06/27/2017  . IR ANGIOGRAM SELECTIVE EACH ADDITIONAL VESSEL  07/09/2017  . IR ANGIOGRAM SELECTIVE EACH ADDITIONAL VESSEL  07/09/2017  . IR ANGIOGRAM VISCERAL SELECTIVE  09/13/2016  . IR ANGIOGRAM VISCERAL SELECTIVE  06/27/2017  . IR ANGIOGRAM VISCERAL SELECTIVE  06/27/2017  . IR ANGIOGRAM VISCERAL SELECTIVE  07/09/2017  . IR EMBO TUMOR ORGAN ISCHEMIA INFARCT INC GUIDE ROADMAPPING  09/13/2016  . IR EMBO TUMOR ORGAN ISCHEMIA INFARCT INC GUIDE ROADMAPPING  07/09/2017  . IR PARACENTESIS  10/10/2017  . IR PARACENTESIS  10/28/2017  . IR RADIOLOGIST EVAL & MGMT  08/21/2016  . IR RADIOLOGIST EVAL & MGMT  08/30/2016  . IR RADIOLOGIST EVAL & MGMT  09/26/2016  . IR RADIOLOGIST EVAL & MGMT  01/10/2017  . IR RADIOLOGIST EVAL & MGMT  11/06/2016  . IR RADIOLOGIST EVAL & MGMT  05/16/2017  . IR RADIOLOGIST EVAL & MGMT  06/13/2017  . IR RADIOLOGIST EVAL & MGMT  08/01/2017  . IR RADIOLOGIST EVAL & MGMT  10/16/2017  . IR US GUIDE VASC ACCESS RIGHT  09/13/2016  . IR US GUIDE VASC ACCESS RIGHT  06/27/2017  . IR US GUIDE VASC ACCESS RIGHT  07/09/2017  .  left orchiectomy  07/2009  . RADIOFREQUENCY ABLATION N/A 10/05/2016   Procedure: LIVER MICROWAVE THERMAL ABLATION;  Surgeon: Sandi Mariscal, MD;  Location: [facility];  Service: Anesthesiology;  Laterality: N/A;  . UMBILICAL HERNIA REPAIR      SOCIAL HISTORY: Social History   Socioeconomic History  . Marital status: Single    Spouse name: Not on file  . Number of children: Not on file  . Years of education: Not on file  . Highest education level: Not on file  Occupational History  . Not on file  Social Needs  . Financial resource strain: Not on file  . Food insecurity:    Worry: Not on file    Inability: Not on file  . Transportation needs:    Medical: Not on file    Non-medical: Not on file  Tobacco Use  . Smoking status: Former Smoker    Packs/day: 0.50    Years: 42.00    Pack years: 21.00    Types: Cigarettes    Start date: 05/21/1971    Last attempt to quit: 07/28/2017    Years since quitting: 0.2  . Smokeless tobacco: Former Systems developer  . Tobacco comment: Has cut down to about 1/3 pack per day  Substance and Sexual Activity  . Alcohol use: No    Alcohol/week: 16.8 oz    Types: 28 Cans of beer per week    Comment: hx of nothing current   . Drug use: No  . Sexual activity: Not on file  Lifestyle  . Physical activity:    Days per week: Not on file    Minutes per session: Not on file  . Stress: Not on file  Relationships  . Social connections:    Talks on phone: Not on file    Gets together: Not on file    Attends religious service: Not on file    Active member of club or organization: Not on file    Attends meetings of clubs or organizations: Not on file    Relationship status: Not on file  . Intimate partner violence:    Fear of current or ex partner: Not on file    Emotionally abused: Not on file    Physically abused: Not on file    Forced sexual activity: Not on file  Other Topics Concern  . Not on file  Social History Narrative  . Not on file   He works as a  Hydrologist.  FAMILY HISTORY: Family History  Problem Relation Age of Onset  . Healthy Mother   . Colon cancer Neg Hx   . Stomach cancer Neg Hx   . Esophageal cancer Neg Hx   . Rectal cancer Neg Hx     ALLERGIES:  has No Known Allergies.  MEDICATIONS:  Current Outpatient Medications  Medication Sig Dispense Refill  . acetaminophen (TYLENOL) 325 MG tablet Take 650 mg by mouth every 6 (six) hours as needed (for headaches.).    Marland Kitchen b complex vitamins capsule Take 1 capsule by mouth daily.    Marland Kitchen lisinopril-hydrochlorothiazide (PRINZIDE,ZESTORETIC) 10-12.5 MG tablet Take 1 tablet by mouth daily. 90 tablet 0  . Multiple Vitamin (MULTIVITAMIN WITH MINERALS) TABS tablet Take 1 tablet by mouth daily.    . sertraline (ZOLOFT) 100 MG tablet TAKE 1 TABLET BY MOUTH EVERY DAY 90 tablet 0  . traZODone (DESYREL) 50 MG tablet TAKE 2 TABLETS BY MOUTH EVERY NIGHT AT BEDTIME 60 tablet 0   Current Facility-Administered Medications  Medication Dose Route Frequency Provider Last Rate Last Dose  . 0.9 %  sodium chloride infusion  500 mL Intravenous Continuous Danis, Estill Cotta III, MD        REVIEW OF SYSTEMS:   Constitutional: Denies fevers, chills or abnormal night sweats Eyes: Denies blurriness of vision, double vision or watery eyes Ears, nose, mouth, throat, and face: Denies mucositis or sore throat Respiratory: Denies cough, dyspnea or wheezes Cardiovascular: Denies palpitation, chest discomfort or (+) occasional LL leg swelling with cramping pain Gastrointestinal:  Denies nausea, heartburn or change in bowel habits Skin: Denies abnormal skin rashes.  Lymphatics: Denies new lymphadenopathy or easy bruising Neurological:Denies numbness, tingling or new weaknesses Behavioral/Psych: Mood is stable, no new changes  All other systems were reviewed with the patient and are negative.  PHYSICAL EXAMINATION: ECOG PERFORMANCE STATUS: 1  Vitals:  10/29/17 0907 10/29/17 0945  BP: (!) 85/60 (!) 89/68    Pulse: 63   Resp: 17   Temp: 97.7 F (36.5 C)   SpO2: 99% (!) 82%   Filed Weights   10/29/17 0907  Weight: 142 lb 14.4 oz (64.8 kg)   GENERAL:alert, no distress and comfortable SKIN: skin color, texture, turgor are normal, no rashes or significant lesions  EYES: normal, conjunctiva are pink and non-injected, sclera clear OROPHARYNX:no exudate, no erythema and lips, buccal mucosa, and tongue normal  NECK: supple, thyroid normal size, non-tender, without nodularity LYMPH:  no palpable lymphadenopathy in the cervical, axillary or inguinal LUNGS: clear to auscultation and percussion with normal breathing effort HEART: regular rate. (+) systolic murmur in the metrial valve area. no lower extremity edema ABDOMEN:abdomen soft, normal bowel sounds (+) Liver enlarged about 6cm below ribcage, non-tender. Musculoskeletal:no cyanosis of digits and no clubbing  PSYCH: alert & oriented x 3 with fluent speech NEURO: no focal motor/sensory deficits  LABORATORY DATA:  I have reviewed the data as listed CBC Latest Ref Rng & Units 10/09/2017 07/09/2017 06/27/2017  WBC 3.8 - 10.8 Thousand/uL 12.1(H) 10.7(H) 10.2  Hemoglobin 13.2 - 17.1 g/dL 13.3 14.1 13.6  Hematocrit 38.5 - 50.0 % 38.2(L) 41.1 39.9  Platelets 140 - 400 Thousand/uL 313 274 245   CMP Latest Ref Rng & Units 10/09/2017 07/30/2017 07/09/2017  Glucose 65 - 139 mg/dL 132 86 112(H)  BUN 7 - 25 mg/dL 12 6(L) 13  Creatinine 0.70 - 1.25 mg/dL 0.57(L) 0.48(L) 0.59(L)  Sodium 135 - 146 mmol/L 128(L) 137 139  Potassium 3.5 - 5.3 mmol/L 3.7 4.1 3.9  Chloride 98 - 110 mmol/L 92(L) 101 104  CO2 20 - 32 mmol/L 27 28 22   Calcium 8.6 - 10.3 mg/dL 8.8 8.9 8.9  Total Protein 6.1 - 8.1 g/dL 7.3 7.2 8.0  Total Bilirubin 0.2 - 1.2 mg/dL 0.8 0.7 1.2  Alkaline Phos 38 - 126 U/L - - 266(H)  AST 10 - 35 U/L 53(H) 99(H) 114(H)  ALT 9 - 46 U/L 21 34 44   AFP (<6.1ng/ml): 06/28/2017: 5.3 04/19/2017: 8.7 10/09/2017: 12.6   PROCEDURES  Upper Endoscopy by  Dr. Pincus Sanes 04/29/17 IMPRESSION - Normal larynx. - Normal esophagus. - Portal hypertensive gastropathy. Biopsied. - Normal examined duodenum.   RADIOGRAPHIC STUDIES: I have personally reviewed the radiological images as listed and agreed with the findings in the report.    MRI Abdomen W Wo Contrast 10/17/2015 IMPRESSION: 1. Mild progression of multifocal hepatocellular carcinoma in the cirrhotic liver. Disease is predominantly in the right hepatic lobe and appears relatively stable although new foci of hypervascular lesions are noted medial and cranial to the segment VI ablation defect with washout characteristics compatible with HCC. Tiny new foci of early hyperenhancement in the left liver also concerning for additional sites of new disease. 2. Cholelithiasis.    ASSESSMENT & PLAN: 62 y.o. Caucasian male with a personal history of alcohol abuse, smoking, cirrhosis of the liver, and chronic hepatitis-C.  1. Hepatocellular Carcinoma, multifocal (2) in right lobe, stage II, progressed in liver 04/2017  - Giving his typial MRI image findings, which is consistent with hepatocellular carcinoma, and underline liver cirrhosis, his diagnosis is quite certain. This was the consensus from our GI tumor board and tissue biopsy was not felt to be necessary.  -Due to his significant liver cirrhosis and multifocal disease, surgeon Dr. Barry Dienes did not feel he is a candidate for surgical candidate  -Due to his alcohol abuse, he  was not a candidate for liver transplant. However he has stopped drinking alcohol, he has been to the liver clinic to see NP [name] to discuss liver transplant.   -The patient saw Dr. Pascal Lux in March 2018 and underwent Liver embolization and ablation by Dr. Pascal Lux in 09/2016.  -MRI Abdomen from 01/10/17 showed the treated lesions were smaller.  -MRI abdomen in 04/26/17 which showed multiple new lesions in the liver, suspicious for recurrence. This was reviewed with pt and he  subsequently underwent Y 90 treatment in February 2019. -I reviewed his recent restaging abdominal MRI from 10/16/2017, which unfortunately showed further disease progression in the liver.  His AFP has slightly increased also lately, consistent with disease progression. -We again discussed systemic therapy options today, including first line sorafenib or Lenvatinib, immunotherapy with Nivolumab or Keytruda, and chemotherapy etc. -I recommend Lenvatinib. I discussed the side effects with him in detail such as fatigue, low appetite, skin rash, diarrhea, and high blood pressure, abnormal liver functions, risk of bleeding and thrombosis, perforation etc. I advised him to titrate up to full dose 12mg .  -Goal of therapy is not likely curative but palliative to control his disease for as long as he can tolerate or disease progresses. I do not plan to have him on chemotherapy long term. I briefly discussed the option of Immunotherapy Nivolumab in the future. -I will discuss his case in GI Tumor Board to see if there are other treatment options available.   -I advised him to call our office about his updated medication list and encouraged him to bring his medications with him at next visit.  -F/u in 4 weeks   2. Multiple Lung Nodules  -His CT Chest from 05/30/17 showed multiple lung nodules that do not favor metastasis. Will monitor.  -repeat CT chest in a few weeks   3. Alcohol and Smoking Cessation -The patient has gradually lessened the amount he drank over time. -He has completely stopped drinking alcohol in March 2018. -He has stopped smoking as of 10/29/17 -I encouraged the patient to continue abstaining from alcohol and smoking consumption.  4. Hepatitis-C, untreated -The patient has a history of chronic hepatitis-C. -he will follow up with [name], NP, and possible receive hep C treatment after his cancer treatment.  5. 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  He is on lactulose, Lasix and Aldactone now. -follow up with Dr. Loletha Carrow and [facility]   6. HTN/hypotension -On Lisinopril and HCTZ -He was recently started on Lasix and Aldactone by [name] due to ascites and edema.  -His BP was low at 85/60 today (10/29/17). I encouraged him to get BP meter to check his level at home daily. I discussed if his Systolic BP drops below 622 he should hold his BP medication, Lasix and spironolactone. -I discussed with chemotherapy his BP may increase, we will have to monitor his BP closely and change his medication as needed.  -Rechecked his BP in clinic today and was 84/68 (10/29/17), he was not symptomatic  -will inform Dawn also    PLAN -Ct chest wo contrast in 1-2 weeks -Lab and f/u in 4 weeks  -Lenvatinib to start 4mg  for week 1, 8mg  for week 2 and 3 and 12mg  for week 4.  -hold lisinopril/HCTZ and will discuss with Dawn about his Lasix and  Aldactone dose, I encourage him to see her in 1-2 weeks  -GI tumor board discussion next week  Orders Placed This Encounter  Procedures  . CT Chest Wo Contrast    Standing Status:   Future    Standing Expiration Date:   10/29/2018    Order Specific Question:   Preferred imaging location?    Answer:   [facility]    Order Specific Question:   Radiology Contrast Protocol - do NOT remove file path    Answer:   [email or web address]    All questions were answered. The patient knows to call the clinic with any problems, questions or concerns. I spent 20 minutes counseling the patient face to face. The total time spent in the appointment was 30 minutes and more than 50% was on counseling.     Truitt Merle, MD 10/29/2017   I, Joslyn Devon, am acting as scribe for Truitt Merle, MD.   I have reviewed the above documentation  for accuracy and completeness, and I agree with the above.

## 2017-10-28 ENCOUNTER — Ambulatory Visit: Payer: BLUE CROSS/BLUE SHIELD | Admitting: Hematology

## 2017-10-28 ENCOUNTER — Ambulatory Visit: Payer: BLUE CROSS/BLUE SHIELD | Admitting: Family Medicine

## 2017-10-28 ENCOUNTER — Encounter (HOSPITAL_COMMUNITY): Payer: Self-pay | Admitting: Student

## 2017-10-28 ENCOUNTER — Ambulatory Visit (HOSPITAL_COMMUNITY)
Admission: RE | Admit: 2017-10-28 | Discharge: 2017-10-28 | Disposition: A | Discharge status: Home / Self Care | Payer: BLUE CROSS/BLUE SHIELD | Source: Ambulatory Visit | Attending: Nurse Practitioner | Admitting: Nurse Practitioner

## 2017-10-28 ENCOUNTER — Other Ambulatory Visit: Payer: BLUE CROSS/BLUE SHIELD

## 2017-10-28 VITALS — BP 118/62 | HR 94 | Temp 98.2°F | Wt 136.2 lb

## 2017-10-28 DIAGNOSIS — I10 Essential (primary) hypertension: Secondary | ICD-10-CM | POA: Diagnosis not present

## 2017-10-28 DIAGNOSIS — R188 Other ascites: Secondary | ICD-10-CM | POA: Insufficient documentation

## 2017-10-28 DIAGNOSIS — C220 Liver cell carcinoma: Secondary | ICD-10-CM

## 2017-10-28 DIAGNOSIS — B182 Chronic viral hepatitis C: Secondary | ICD-10-CM

## 2017-10-28 DIAGNOSIS — Z23 Encounter for immunization: Secondary | ICD-10-CM

## 2017-10-28 DIAGNOSIS — K7030 Alcoholic cirrhosis of liver without ascites: Secondary | ICD-10-CM | POA: Diagnosis not present

## 2017-10-28 DIAGNOSIS — F341 Dysthymic disorder: Secondary | ICD-10-CM | POA: Diagnosis not present

## 2017-10-28 HISTORY — PX: IR PARACENTESIS: IMG2679

## 2017-10-28 LAB — BODY FLUID CELL COUNT WITH DIFFERENTIAL
Eos, Fluid: NONE SEEN %
Lymphs, Fluid: 17 %
Monocyte-Macrophage-Serous Fluid: 76 % (ref 50–90)
NEUTROPHIL FLUID: 7 % (ref 0–25)
WBC FLUID: 345 uL (ref 0–1000)

## 2017-10-28 LAB — GRAM STAIN

## 2017-10-28 MED ORDER — LIDOCAINE HCL (PF) 2 % IJ SOLN
INTRAMUSCULAR | Status: AC
  Filled 2017-10-28: qty 20

## 2017-10-28 MED ORDER — LIDOCAINE HCL (PF) 2 % IJ SOLN
INTRAMUSCULAR | Status: DC | PRN
  Administered 2017-10-28: 10 mL

## 2017-10-28 MED ORDER — ALBUMIN HUMAN 25 % IV SOLN
12.5000 g | Freq: Once | INTRAVENOUS | Status: DC
  Filled 2017-10-28: qty 50

## 2017-10-28 NOTE — Procedures (Signed)
PROCEDURE SUMMARY:  Successful US guided paracentesis from right lateral abdomen.  Yielded 7.0 liters of clear, yellow fluid.  No immediate complications.  Pt tolerated well.   Specimen was sent for labs.  Docia Barrier PA-C 10/28/2017 11:11 AM

## 2017-10-28 NOTE — Progress Notes (Signed)
Subjective:     Patient ID: Joshua [name], male   DOB: June 17, 1955, 62 y.o.   MRN: [id number]  HPI Patient is here to establish care with me.  Has seen Dr Raliegh Ip- who is retiring soon.  He has history of alcohol abuse, hypertension, GERD, alcoholic cirrhosis, chronic hepatitis C, hepatocellular carcinoma, history of anxiety and depression. He is followed by oncology as well as liver clinic. He states he had paracentesis earlier today  Long history of alcohol abuse. Quit smoking recently back in March.  No history of known Pneumovax. Medications reviewed. Compliant with all. He apparently was started recently on what sounds like lactulose and some other type of diuretic-? Aldactone. We have no records and he will call back with those  Past Medical History:  Diagnosis Date  . Cancer ([place])   . Cataract   . Chronic alcoholism ([place])   . Cirrhosis ([place])   . Depression   . Glucose intolerance (impaired glucose tolerance)   . Hepatitis C   . Hypertension   . Tobacco use   . Umbilical hernia    Past Surgical History:  Procedure Laterality Date  . CATARACT EXTRACTION    . COLONOSCOPY  12/09  . INGUINAL HERNIA REPAIR    . IR 3D INDEPENDENT WKST  09/13/2016  . IR [place] ADDITIONAL VESSEL  09/13/2016  . IR [place] ADDITIONAL VESSEL  09/13/2016  . IR [place] ADDITIONAL VESSEL  09/13/2016  . IR [place] ADDITIONAL VESSEL  09/13/2016  . IR [place] ADDITIONAL VESSEL  09/13/2016  . IR ANGIOGRAM SELECTIVE EACH ADDITIONAL VESSEL  06/27/2017  . IR ANGIOGRAM SELECTIVE EACH ADDITIONAL VESSEL  06/27/2017  . IR ANGIOGRAM SELECTIVE EACH ADDITIONAL VESSEL  06/27/2017  . IR ANGIOGRAM SELECTIVE EACH ADDITIONAL VESSEL  07/09/2017  . IR ANGIOGRAM SELECTIVE EACH ADDITIONAL VESSEL  07/09/2017  . IR ANGIOGRAM VISCERAL SELECTIVE  09/13/2016  . IR ANGIOGRAM VISCERAL SELECTIVE  06/27/2017  . IR ANGIOGRAM VISCERAL SELECTIVE  06/27/2017  . IR ANGIOGRAM  VISCERAL SELECTIVE  07/09/2017  . IR EMBO TUMOR ORGAN ISCHEMIA INFARCT INC GUIDE ROADMAPPING  09/13/2016  . IR EMBO TUMOR ORGAN ISCHEMIA INFARCT INC GUIDE ROADMAPPING  07/09/2017  . IR PARACENTESIS  10/10/2017  . IR PARACENTESIS  10/28/2017  . IR RADIOLOGIST EVAL & MGMT  08/21/2016  . IR RADIOLOGIST EVAL & MGMT  08/30/2016  . IR RADIOLOGIST EVAL & MGMT  09/26/2016  . IR RADIOLOGIST EVAL & MGMT  01/10/2017  . IR RADIOLOGIST EVAL & MGMT  11/06/2016  . IR RADIOLOGIST EVAL & MGMT  05/16/2017  . IR RADIOLOGIST EVAL & MGMT  06/13/2017  . IR RADIOLOGIST EVAL & MGMT  08/01/2017  . IR RADIOLOGIST EVAL & MGMT  10/16/2017  . IR US GUIDE VASC ACCESS RIGHT  09/13/2016  . IR US GUIDE VASC ACCESS RIGHT  06/27/2017  . IR US GUIDE VASC ACCESS RIGHT  07/09/2017  . left orchiectomy  07/2009  . RADIOFREQUENCY ABLATION N/A 10/05/2016   Procedure: LIVER MICROWAVE THERMAL ABLATION;  Surgeon: Sandi Mariscal, MD;  Location: [facility];  Service: Anesthesiology;  Laterality: N/A;  . UMBILICAL HERNIA REPAIR      reports that he quit smoking about 3 months ago. His smoking use included cigarettes. He started smoking about 46 years ago. He has a 21.00 pack-year smoking history. He has quit using smokeless tobacco. He reports that he does not drink alcohol or use drugs. family history includes Healthy in his mother.  No Known Allergies   Review of Systems  Constitutional: Positive for fatigue. Negative for chills and fever.  Eyes: Negative for visual disturbance.  Respiratory: Negative for cough, chest tightness and shortness of breath.   Cardiovascular: Negative for chest pain, palpitations and leg swelling.  Neurological: Negative for dizziness, syncope, weakness, light-headedness and headaches.       Objective:   Physical Exam  Constitutional: He is oriented to person, place, and time.  Alert and cooperative 62 year old male in no distress  Cardiovascular: Normal rate and regular rhythm.  Pulmonary/Chest: Effort normal and breath  sounds normal.  Musculoskeletal: He exhibits no edema.  Neurological: He is alert and oriented to person, place, and time.       Assessment:     #1 history of hypertension stable  #2 long history of alcohol abuse  #3 alcoholic cirrhosis  #4 chronic hepatitis C    Plan:     -Continue close follow-up with liver care clinic and oncology -Pneumovax given -Continue lisinopril HCTZ -Reminder for annual flu vaccine in the Fall.  [name] MD [facility]

## 2017-10-29 ENCOUNTER — Other Ambulatory Visit: Payer: Self-pay

## 2017-10-29 ENCOUNTER — Encounter: Payer: Self-pay | Admitting: Hematology

## 2017-10-29 ENCOUNTER — Inpatient Hospital Stay: Payer: BLUE CROSS/BLUE SHIELD

## 2017-10-29 ENCOUNTER — Telehealth: Payer: Self-pay | Admitting: Hematology

## 2017-10-29 ENCOUNTER — Inpatient Hospital Stay: Payer: BLUE CROSS/BLUE SHIELD | Attending: Hematology | Admitting: Hematology

## 2017-10-29 ENCOUNTER — Telehealth: Payer: Self-pay

## 2017-10-29 VITALS — BP 89/68 | HR 63 | Temp 97.7°F | Resp 17 | Ht 71.0 in | Wt 142.9 lb

## 2017-10-29 DIAGNOSIS — K3189 Other diseases of stomach and duodenum: Secondary | ICD-10-CM | POA: Insufficient documentation

## 2017-10-29 DIAGNOSIS — Z87891 Personal history of nicotine dependence: Secondary | ICD-10-CM | POA: Insufficient documentation

## 2017-10-29 DIAGNOSIS — K766 Portal hypertension: Secondary | ICD-10-CM | POA: Diagnosis not present

## 2017-10-29 DIAGNOSIS — I700 Atherosclerosis of aorta: Secondary | ICD-10-CM | POA: Diagnosis not present

## 2017-10-29 DIAGNOSIS — R600 Localized edema: Secondary | ICD-10-CM | POA: Diagnosis not present

## 2017-10-29 DIAGNOSIS — I10 Essential (primary) hypertension: Secondary | ICD-10-CM | POA: Insufficient documentation

## 2017-10-29 DIAGNOSIS — F1011 Alcohol abuse, in remission: Secondary | ICD-10-CM | POA: Insufficient documentation

## 2017-10-29 DIAGNOSIS — R918 Other nonspecific abnormal finding of lung field: Secondary | ICD-10-CM | POA: Insufficient documentation

## 2017-10-29 DIAGNOSIS — K740 Hepatic fibrosis: Secondary | ICD-10-CM | POA: Diagnosis not present

## 2017-10-29 DIAGNOSIS — B182 Chronic viral hepatitis C: Secondary | ICD-10-CM | POA: Diagnosis not present

## 2017-10-29 DIAGNOSIS — C220 Liver cell carcinoma: Secondary | ICD-10-CM | POA: Diagnosis not present

## 2017-10-29 DIAGNOSIS — K7031 Alcoholic cirrhosis of liver with ascites: Secondary | ICD-10-CM | POA: Insufficient documentation

## 2017-10-29 DIAGNOSIS — R252 Cramp and spasm: Secondary | ICD-10-CM | POA: Insufficient documentation

## 2017-10-29 DIAGNOSIS — Z79899 Other long term (current) drug therapy: Secondary | ICD-10-CM | POA: Diagnosis not present

## 2017-10-29 DIAGNOSIS — K7030 Alcoholic cirrhosis of liver without ascites: Secondary | ICD-10-CM

## 2017-10-29 MED ORDER — LENVATINIB (12 MG DAILY DOSE) 3 X 4 MG PO CPPK: 12.0000 mg | ORAL_CAPSULE | Freq: Every day | ORAL | 0 refills | Status: DC

## 2017-10-29 NOTE — Telephone Encounter (Signed)
Appointments scheduled AVS/Calendar printed per 6/18 los °

## 2017-10-29 NOTE — Telephone Encounter (Signed)
Patient calls back with the following medications that are not on our list:  Lactulose 10 mg daily Spironolactone 100 mg daily Furosemide 40 mg daily

## 2017-10-29 NOTE — Telephone Encounter (Signed)
Called pt re appt being cancelled per 6/15 sch msg - left vm with appts

## 2017-10-30 ENCOUNTER — Other Ambulatory Visit: Payer: Self-pay

## 2017-10-30 ENCOUNTER — Telehealth: Payer: Self-pay

## 2017-10-30 LAB — PATHOLOGIST SMEAR REVIEW

## 2017-10-30 NOTE — Telephone Encounter (Signed)
Script for [place] faxed to [facility] at [phone]

## 2017-11-02 LAB — CULTURE, BODY FLUID-BOTTLE: CULTURE: NO GROWTH

## 2017-11-02 LAB — CULTURE, BODY FLUID W GRAM STAIN -BOTTLE

## 2017-11-05 ENCOUNTER — Telehealth: Payer: Self-pay | Admitting: Pharmacist

## 2017-11-05 DIAGNOSIS — C220 Liver cell carcinoma: Secondary | ICD-10-CM

## 2017-11-05 MED ORDER — LENVATINIB (12 MG DAILY DOSE) 3 X 4 MG PO CPPK: ORAL_CAPSULE | ORAL | 0 refills | Status: DC

## 2017-11-05 NOTE — Telephone Encounter (Signed)
Oral Oncology Pharmacist Encounter  Received new prescription for Lenvima (lenvantinib) for the treatment of hepatocellular carcinoma, planned duration until disease progression or unacceptable toxicity. Original diagnosis in Feb 2018, stage II Patient underwent liver emobolization and microwave ablation in May 2018 He subsequently experienced progression and received Y90 in Feb 2019 Progression is again noted and patient is being considered to start treatment with Lenvima.  Patient will be initiated in dose titration schedule Pt wt = 64.8kg Target dose of Lenvima for [place] based on body weight with '12mg'$  total daily dose being target for patients >/= 60kg  Labs from [facility] assessed Noted increased Alk Phos and AST, bilirubin remains WNL BPs have been low recently, patient just instructed to discontinue use of antihypertensive No other cardiac history noted TSH is monitored yearly, will continue to be monitored No baseline urine protein, this will be monitored periodically  Current medication list in Epic reviewed, category C DDI with Lenvima and sertraline identified due to both agents ability to prolong QTc interval. No baseline assessment of QTc Electrolytes will be closely monitored, no change to current therapy indicated at this time.  Prescription has been e-scribed to the [facility] for benefits analysis and approval.  Oral Oncology Clinic will continue to follow for insurance authorization, copayment issues, initial counseling and start date.  [name], PharmD, BCPS, BCOP  11/05/2017 2:56 PM Oral Oncology Clinic [phone]

## 2017-11-06 ENCOUNTER — Telehealth: Payer: Self-pay

## 2017-11-06 NOTE — Telephone Encounter (Addendum)
Oral Chemotherapy Pharmacy Student Encounter   I spoke with Mr. Pringle for overview of: Lenvima (lenvatinib) for the treatment of hepatocellular carcinoma, planned duration until disease progression or unacceptable toxicity.   Counseled patient on administration, dosing, side effects, monitoring, drug-food interactions, safe handling, storage, and disposal.  Patient will be initiated on dose titration schedule with Lenvima:  Week 1: Lenvima 4mg  capsules, 1 capsule (4mg ) by mouth once daily, with or without food, at approximately the same time each day.  Week 2/3:  Lenvima 4 mg, 2 capsules (8mg ) by mouth once daily, with or without food, at approximately the same time each day. Week 4+: Lenvima 4 mg, 3 capsules (12 mg) by mouth once daily, with or without food, at approximately the same time each day. Patient knows to call if side effects become intolerable at any point during titration/maintenance therapy.   Lenvima start date: pending insurance authorization  Adverse effects include but are not limited to: hypertension, hand-foot syndrome, diarrhea, joint pain, fatigue, headache, decreased calcium, proteinuria, increased risk of blood clots, and cardiac conduction issues.   Patient will obtain anti diarrheal and alert the office of 4 or more loose stools above baseline.  Patient instructed to notify office of any upcoming invasive procedures.  Michel Santee will be held for 6 days prior to scheduled surgery, restart based on healing and clinical judgement.   Reviewed with patient importance of keeping a medication schedule and plan for any missed doses.  Patient was made aware that labs will be closely monitored for DDIs between [facility] and sertraline due to their slight risk of QTc prolongation.  Mr. Neisen voiced understanding and appreciation.   All questions answered. Medication reconciliation performed and medication/allergy list updated.  Pt reported that he is holding lisinopril-HCTZ per  Dr. Burr Medico. Pt reported he was still taking furosemide and spironolactone daily despite systolic blood pressures being below 100. Recommended to Pt to hold these medications on days his blood pressure drops below 100.  Pt reported taking lactulose prn when he experiences excess bloating. Pt states needing lactulose 2-3 times per week.   Will follow up with patient regarding insurance and pharmacy.   Patient knows to call the office with questions or concerns. Oral Oncology Clinic will continue to follow.  Thank you,  Willia Craze, Pharmacy Student 11/06/2017   2:47 PM Oral Oncology Clinic [phone]  Addendum: I have independently reviewed patient and agree with above. [name], PharmD, BCPS, BCOP

## 2017-11-07 ENCOUNTER — Other Ambulatory Visit (HOSPITAL_COMMUNITY): Payer: Self-pay | Admitting: Nurse Practitioner

## 2017-11-07 ENCOUNTER — Telehealth: Payer: Self-pay | Admitting: Pharmacy Technician

## 2017-11-07 DIAGNOSIS — R188 Other ascites: Secondary | ICD-10-CM

## 2017-11-07 MED ORDER — LENVATINIB (12 MG DAILY DOSE) 3 X 4 MG PO CPPK: ORAL_CAPSULE | ORAL | 0 refills | Status: DC

## 2017-11-07 NOTE — Telephone Encounter (Signed)
Oral Oncology Pharmacist Encounter  Received notification that insurance authorization of Joshua [name] has been approved. Lenvima prescription has been E scribed to [facility] [phone]) for dispensing as it is within network of patient's prescription insurance coverage.  I called and updated patient about approved insurance authorization and dispensing pharmacy. Patient provided contact information to dispensing pharmacy.  Patient instructed to contact [facility] on Monday (11/11/2017) if he had not yet heard from them to schedule delivery of his first fill of Lenvima. Patient informed that dispensing pharmacy will share co-pay information with him at the time of initial onboarding call.  Patient informed that if co-pay is not affordable that dispensing pharmacy has co-pay assistance team that he should ask to get him assistance. Patient instructed to also call me back at the office if he is unable to afford his co-pay so that we can work on this side.  Patient states that he understands titration schedule of Lenvima. Confirmed office visits on 11/25/2017 with patient. Patient instructed to contact the office with any additional questions or concerns between now and then.  Patient expressed understanding and appreciation.  Oral oncology clinic will continue to follow.  [name], PharmD, BCPS, BCOP  11/07/2017 1:45 PM Oral Oncology Clinic [phone]

## 2017-11-07 NOTE — Telephone Encounter (Signed)
Oral Oncology Patient Advocate Encounter  Prior Authorization for Michel Santee has been approved.    PA# none Effective dates: 11/05/17 through 11/05/18  Oral Oncology Clinic will continue to follow.   [place] Patient [place] Phone [phone] Fax [phone] 11/07/2017 2:02 PM

## 2017-11-07 NOTE — Telephone Encounter (Signed)
Oral Oncology Patient Advocate Encounter  Received notification from [facility] that prior authorization for Joshua [name] is required.  PA submitted on CoverMyMeds Key [id number] Date 11/05/17  Status is pending  Oral Oncology Clinic will continue to follow.  [name] Phone [phone] Fax [phone]

## 2017-11-11 ENCOUNTER — Ambulatory Visit (HOSPITAL_COMMUNITY)
Admission: RE | Admit: 2017-11-11 | Discharge: 2017-11-11 | Disposition: A | Discharge status: Home / Self Care | Payer: BLUE CROSS/BLUE SHIELD | Source: Ambulatory Visit | Attending: Nurse Practitioner | Admitting: Nurse Practitioner

## 2017-11-11 ENCOUNTER — Telehealth: Payer: Self-pay | Admitting: Pharmacist

## 2017-11-11 ENCOUNTER — Encounter (HOSPITAL_COMMUNITY): Payer: Self-pay | Admitting: Interventional Radiology

## 2017-11-11 DIAGNOSIS — R188 Other ascites: Secondary | ICD-10-CM | POA: Diagnosis not present

## 2017-11-11 DIAGNOSIS — C220 Liver cell carcinoma: Secondary | ICD-10-CM

## 2017-11-11 HISTORY — PX: IR PARACENTESIS: IMG2679

## 2017-11-11 MED ORDER — LENVATINIB (12 MG DAILY DOSE) 3 X 4 MG PO CPPK: ORAL_CAPSULE | ORAL | 0 refills | Status: DC

## 2017-11-11 MED ORDER — LIDOCAINE HCL (PF) 2 % IJ SOLN
INTRAMUSCULAR | Status: AC
  Filled 2017-11-11: qty 20

## 2017-11-11 MED ORDER — LIDOCAINE HCL (PF) 1 % IJ SOLN
INTRAMUSCULAR | Status: DC | PRN
  Administered 2017-11-11: 8 mL

## 2017-11-11 NOTE — Procedures (Signed)
   US guided LLQ paracentesis  5.2 L yellow fluid'  Tolerated well

## 2017-11-11 NOTE — Telephone Encounter (Signed)
Oral Oncology Pharmacist Encounter  Received notification from [facility] that they do not have access to dispense Lenvima.  Prescriptions for Radene Gunning initiation has been e-scribed to [facility] in [place], [place] (ph: [phone])  I called and LVM with above information for patient.  [name], PharmD, BCPS, BCOP  11/11/2017 10:08 AM Oral Oncology Clinic [phone]

## 2017-11-12 ENCOUNTER — Ambulatory Visit (HOSPITAL_COMMUNITY)
Admission: RE | Admit: 2017-11-12 | Discharge: 2017-11-12 | Disposition: A | Discharge status: Home / Self Care | Payer: BLUE CROSS/BLUE SHIELD | Source: Ambulatory Visit | Attending: Hematology | Admitting: Hematology

## 2017-11-12 DIAGNOSIS — C220 Liver cell carcinoma: Secondary | ICD-10-CM | POA: Insufficient documentation

## 2017-11-12 DIAGNOSIS — I700 Atherosclerosis of aorta: Secondary | ICD-10-CM | POA: Insufficient documentation

## 2017-11-12 DIAGNOSIS — J439 Emphysema, unspecified: Secondary | ICD-10-CM | POA: Diagnosis not present

## 2017-11-12 DIAGNOSIS — R188 Other ascites: Secondary | ICD-10-CM | POA: Diagnosis not present

## 2017-11-15 NOTE — Telephone Encounter (Signed)
Oral Oncology Pharmacist Encounter  I called [facility] specialty pharmacy at [phone] to follow-up on patient's first shipment of Lenvima. Per representative, prescription is in final stages of pharmacist verification. That is the last step in their process prior to being able to reach out to patient to schedule shipment. Representative stated she believes they will be reaching out to patient by the end of business today (11/15/2017).  [name], PharmD, BCPS, BCOP  11/15/2017 2:16 PM Oral Oncology Clinic [phone]

## 2017-11-18 ENCOUNTER — Telehealth: Payer: Self-pay

## 2017-11-18 ENCOUNTER — Telehealth: Payer: Self-pay | Admitting: Pharmacist

## 2017-11-18 NOTE — Telephone Encounter (Signed)
Oral Oncology Pharmacist Encounter  Received call from patient with questions about Lenvima acquisition. Patient states he had not yet heard from a dispensing pharmacy to schedule the first shipment of his [place]. Patient states he did not receive a message from this office on 11/11/2017 with update about change in dispensing pharmacy from [facility] to [facility], which also included phone number to [facility].  Patient informed that I have spoken with [facility] this past Friday (11/15/2017), and was informed in the early afternoon that his [place] prescription was in the final stages of pharmacist verification.  Representative had stated that they would be reaching out to patient to schedule shipment by the end of that business day. Patient states he has not yet heard from [facility].  I gave [facility] contact information to the patient (ph: [phone]). Patient instructed to follow-up with [facility] for status of initial shipment. Patient instructed to give me a call back in the office if he runs into any issues obtaining his first fill of Lenvima.  Patient expressed understanding and appreciation. Patient knows to call the office with any additional questions or concerns.  [name], PharmD, BCPS, BCOP  11/18/2017 1:47 PM Oral Oncology Clinic [phone]

## 2017-11-18 NOTE — Telephone Encounter (Signed)
Opened in error

## 2017-11-20 ENCOUNTER — Other Ambulatory Visit (HOSPITAL_COMMUNITY): Payer: Self-pay | Admitting: Nurse Practitioner

## 2017-11-20 DIAGNOSIS — R188 Other ascites: Secondary | ICD-10-CM

## 2017-11-20 NOTE — Telephone Encounter (Addendum)
Oral Oncology Pharmacist Encounter  Called [facility] this morning to check on status of Lenvima Rx. Per representative, Lenvima shipped on 7/9 for delivery to patient's home today (11/20/17). Copay $0 on insurance  [name], PharmD, [place], BCOP  11/20/2017 9:18 AM Oral Oncology Clinic [phone]

## 2017-11-21 ENCOUNTER — Encounter (HOSPITAL_COMMUNITY): Payer: Self-pay | Admitting: Physician Assistant

## 2017-11-21 ENCOUNTER — Ambulatory Visit (HOSPITAL_COMMUNITY)
Admission: RE | Admit: 2017-11-21 | Discharge: 2017-11-21 | Disposition: A | Discharge status: Home / Self Care | Payer: BLUE CROSS/BLUE SHIELD | Source: Ambulatory Visit | Attending: Nurse Practitioner | Admitting: Nurse Practitioner

## 2017-11-21 DIAGNOSIS — R188 Other ascites: Secondary | ICD-10-CM | POA: Diagnosis present

## 2017-11-21 HISTORY — PX: IR PARACENTESIS: IMG2679

## 2017-11-21 MED ORDER — LIDOCAINE HCL (PF) 2 % IJ SOLN
INTRAMUSCULAR | Status: DC | PRN
  Administered 2017-11-21: 10 mL

## 2017-11-21 MED ORDER — LIDOCAINE HCL (PF) 2 % IJ SOLN
INTRAMUSCULAR | Status: AC
  Filled 2017-11-21: qty 20

## 2017-11-21 NOTE — Procedures (Signed)
PROCEDURE SUMMARY:  Successful image-guided paracentesis from the left abdomen.  Yielded 2 liters of clear yellow fluid.  No immediate complications.  Patient tolerated well.   Specimen was sent for labs.  Joaquim Nam PA-C 11/21/2017 11:25 AM

## 2017-11-24 NOTE — Progress Notes (Signed)
[place]  [phone] [phone]  Clinic Follow up Note   Patient Care Team: Marletta Lor, MD as PCP - General Danis, Kirke Corin, MD as Consulting Physician (Gastroenterology) Sandi Mariscal, MD as Consulting Physician (Interventional Radiology) Truitt Merle, MD as Consulting Physician (Hematology) 11/25/2017  SUMMARY OF ONCOLOGIC HISTORY: Oncology History   Cancer Staging Hepatocellular carcinoma [facility]) Staging form: Liver, AJCC 8th Edition - Clinical stage from 08/01/2016: Stage II (cT2(m), cN0, cM0) - Signed by Truitt Merle, MD on 08/24/2016       Hepatocellular carcinoma ([place])   06/28/2016 Tumor Marker    AFP 5.3      07/12/2016 Imaging    US Abdomen 07/12/16 IMPRESSION: 1. There is shadowing gallstone within gallbladder measures 1.6 cm. No sonographic Murphy's sign. No thickening of gallbladder wall. Normal CBD. 2. Again noted heterogeneous increased echogenicity of the liver with nodular contour consistent with cirrhosis. There is hypoechoic lesion in right hepatic lobe measures 1.7 x 2.4 cm. There is a second hypoechoic lesion in inferior aspect of the right hepatic lobe measures 4.6 x 3.6 cm. This is ill defined. Further evaluation with enhanced MRI is recommended to exclude evolving hepatic masses. 3. No hydronephrosis or renal calculi. 4. No aortic aneurysm.      07/16/2016 Imaging    MRI Abdomen w wo Contrast 07/16/16 IMPRESSION: 1. Two enhancing lesions in the RIGHT hepatic lobe on the background of hepatitis-C and liver cirrhosis are consistent with hepatocellular carcinoma. Recommend multidisciplinary GI, surgical and oncological consultation. 2. Morphologic changes of cirrhosis. 3. No ascites.  Patent portal veins.      07/16/2016 Initial Diagnosis    Hepatocellular carcinoma ([place])      09/13/2016 Procedure    Bland liver embolization before microwave ablation, Dr. Pascal Lux       10/05/2016 Surgery    LIVER MICROWAVE  THERMAL ABLATION by Dr. Pascal Lux  10/05/16      01/10/2017 Imaging    MRI Abdomen W WO Contrast 01/10/17 IMPRESSION: 1. Expected postprocedural changes from ablation of 2 hepatic masses. No complicating features are demonstrated. No findings suspicious for residual or recurrent tumor. 2. 10 mm early arterial phase enhancing nodule in the right hepatic lobe posteriorly could be a dysplastic nodule or early Peck. Recommend continued surveillance. 3. Severe cirrhotic changes involving the liver with areas of confluent hepatic fibrosis and markedly enlarged caudate lobe. Stable portal venous collaterals, portal venous hypertension and esophageal varices. No splenomegaly or ascites. 4. Stable cholelithiasis.       04/26/2017 Imaging    MRI Abdomen W Wo Contrast 04/26/17 IMPRESSION: 1. Although the ablation zones along the dome of liver in segment 6 of the right lobe demonstrate interval decrease in size from previous exam there are multiple new arterial phase enhancing lesions involving both lobes of liver worrisome for recurrent multicentric HCC. 2.  Aortic Atherosclerosis (ICD10-I70.0).      04/29/2017 Procedure    Upper Endoscopy by Dr. Pincus Sanes 04/29/17 IMPRESSION - Normal larynx. - Normal esophagus. - Portal hypertensive gastropathy. Biopsied. - Normal examined duodenum.      05/30/2017 Imaging    CT Chest 05/30/17 IMPRESSION: 1. Slow enlargement of RIGHT upper lobe ground-glass nodule. Recommend follow-up CT without contrast 12 months. 2. Mild increase in prominence of 4 mm solid and semi-solid nodule in the RIGHT lower lobe. Recommend follow-up in 12 months as above. Pulmonary nodules not favored metastatic but primary bronchogenic adenocarcinoma cannot be excluded. Recommendations for the Management of Subsolid Pulmonary Nodules  Detected at CT: A Statement from the [place] [facility] 2013; 266:1, [phone]. 3. Nodular cirrhotic liver treatment sites noted Aortic  Atherosclerosis (ICD10-I70.0).      06/2017 Procedure    He underwent Y90 treatment on 06/27/17 and 07/09/17 by Dr. Pascal Lux      10/16/2017 Imaging    MRI Abdomen 10/16/17 IMPRESSION: 1. Mild progression of multifocal hepatocellular carcinoma in the cirrhotic liver. Disease is predominantly in the right hepatic lobe and appears relatively stable although new foci of hypervascular lesions are noted medial and cranial to the segment VI ablation defect with washout characteristics compatible with HCC. Tiny new foci of early hyperenhancement in the left liver also concerning for additional sites of new disease. 2. Cholelithiasis.       Chemotherapy    PENDING Lenvatinib 4mg  week one, 8mg  week 2 and 3 and 12mg  week 4      11/12/2017 Imaging    IMPRESSION: 1. Stable CT chest exam. No substantial change in the right lung nodules. Continued attention on follow-up suggested. 2. Cirrhotic morphology of the liver with interval increase in associated ascites. 3.  Aortic Atherosclerois (ICD10-170.0) 4.  Emphysema. (SAY30-Z60.9)     CURRENT THERAPY: Lenvatinib 4mg  week one, 8mg  week 2 and 3 and 12mg  week 4, began 11/24/17    INTERVAL HISTORY: Mr. Albus returns for follow up as scheduled. He was last seen by Dr. Burr Medico 1 months ago. Began lenvatinib on 11/24/17, he has not noticed any side effects thus far. There was a delay with delivery. S/p paracentesis on 11/21/17, ordered with 2 liter maximum draw. He does not feel relief from the procedure, still with abdominal tightness and discomfort. He is not able to eat much at a time. Also reports bilateral lower extremity edema. Feet feel tight. Denies dyspnea, cough, or chest pain. Denies fever. He will see [name], NP later this week.   REVIEW OF SYSTEMS:   Constitutional: Denies fevers, chills or abnormal weight loss (+) weight fluctuates  Eyes: Denies blurriness of vision Ears, nose, mouth, throat, and face: Denies mucositis or sore  throat Respiratory: Denies cough, dyspnea or wheezes Cardiovascular: Denies palpitation, chest discomfort (+) lower extremity swelling Gastrointestinal:  Denies nausea, vomiting, constipation, diarrhea, heartburn or change in bowel habits (+) abdominal tightness (+) abd discomfort  Skin: Denies abnormal skin rashes Lymphatics: Denies new lymphadenopathy or easy bruising Neurological:Denies numbness, tingling or new weaknesses Behavioral/Psych: Mood is stable, no new changes  All other systems were reviewed with the patient and are negative.  MEDICAL HISTORY:  Past Medical History:  Diagnosis Date  . Cancer ([place])   . Cataract   . Chronic alcoholism ([place])   . Cirrhosis ([place])   . Depression   . Glucose intolerance (impaired glucose tolerance)   . Hepatitis C   . Hypertension   . Tobacco use   . Umbilical hernia     SURGICAL HISTORY: Past Surgical History:  Procedure Laterality Date  . CATARACT EXTRACTION    . COLONOSCOPY  12/09  . INGUINAL HERNIA REPAIR    . IR 3D INDEPENDENT WKST  09/13/2016  . IR [place] ADDITIONAL VESSEL  09/13/2016  . IR [place] ADDITIONAL VESSEL  09/13/2016  . IR [place] ADDITIONAL VESSEL  09/13/2016  . IR [place] ADDITIONAL VESSEL  09/13/2016  . IR [place] ADDITIONAL VESSEL  09/13/2016  . IR ANGIOGRAM SELECTIVE EACH ADDITIONAL VESSEL  06/27/2017  . IR ANGIOGRAM SELECTIVE EACH ADDITIONAL VESSEL  06/27/2017  .  IR ANGIOGRAM SELECTIVE EACH ADDITIONAL VESSEL  06/27/2017  . IR ANGIOGRAM SELECTIVE EACH ADDITIONAL VESSEL  07/09/2017  . IR ANGIOGRAM SELECTIVE EACH ADDITIONAL VESSEL  07/09/2017  . IR ANGIOGRAM VISCERAL SELECTIVE  09/13/2016  . IR ANGIOGRAM VISCERAL SELECTIVE  06/27/2017  . IR ANGIOGRAM VISCERAL SELECTIVE  06/27/2017  . IR ANGIOGRAM VISCERAL SELECTIVE  07/09/2017  . IR EMBO TUMOR ORGAN ISCHEMIA INFARCT INC GUIDE ROADMAPPING  09/13/2016  . IR EMBO TUMOR ORGAN ISCHEMIA INFARCT INC GUIDE  ROADMAPPING  07/09/2017  . IR PARACENTESIS  10/10/2017  . IR PARACENTESIS  10/28/2017  . IR PARACENTESIS  11/11/2017  . IR PARACENTESIS  11/21/2017  . IR RADIOLOGIST EVAL & MGMT  08/21/2016  . IR RADIOLOGIST EVAL & MGMT  08/30/2016  . IR RADIOLOGIST EVAL & MGMT  09/26/2016  . IR RADIOLOGIST EVAL & MGMT  01/10/2017  . IR RADIOLOGIST EVAL & MGMT  11/06/2016  . IR RADIOLOGIST EVAL & MGMT  05/16/2017  . IR RADIOLOGIST EVAL & MGMT  06/13/2017  . IR RADIOLOGIST EVAL & MGMT  08/01/2017  . IR RADIOLOGIST EVAL & MGMT  10/16/2017  . IR US GUIDE VASC ACCESS RIGHT  09/13/2016  . IR US GUIDE VASC ACCESS RIGHT  06/27/2017  . IR US GUIDE VASC ACCESS RIGHT  07/09/2017  . left orchiectomy  07/2009  . RADIOFREQUENCY ABLATION N/A 10/05/2016   Procedure: LIVER MICROWAVE THERMAL ABLATION;  Surgeon: Sandi Mariscal, MD;  Location: [facility];  Service: Anesthesiology;  Laterality: N/A;  . UMBILICAL HERNIA REPAIR      I have reviewed the social history and family history with the patient and they are unchanged from previous note.  ALLERGIES:  has No Known Allergies.  MEDICATIONS:  Current Outpatient Medications  Medication Sig Dispense Refill  . acetaminophen (TYLENOL) 325 MG tablet Take 650 mg by mouth every 6 (six) hours as needed (for headaches.).    Marland Kitchen b complex vitamins capsule Take 1 capsule by mouth daily.    . furosemide (LASIX) 40 MG tablet Take 40 mg by mouth.    . lactulose (CEPHULAC) 10 g packet Take 10 g by mouth 3 (three) times daily.    . Lenvatinib 12 mg daily dose (LENVIMA) 4 (3) MG capsule Week1: Take 1 tab (4mg ) by mouth daily. Weeks 2&3: Take 2 tabs (8mg ) daily. Week 4 & onward: Take 3 tabs (12mg ) daily. 62 capsule 0  . lisinopril-hydrochlorothiazide (PRINZIDE,ZESTORETIC) 10-12.5 MG tablet Take 1 tablet by mouth daily. 90 tablet 0  . Multiple Vitamin (MULTIVITAMIN WITH MINERALS) TABS tablet Take 1 tablet by mouth daily.    . sertraline (ZOLOFT) 100 MG tablet TAKE 1 TABLET BY MOUTH EVERY DAY 90 tablet 0  .  spironolactone (ALDACTONE) 100 MG tablet Take 100 mg by mouth daily.    . traZODone (DESYREL) 50 MG tablet TAKE 2 TABLETS BY MOUTH EVERY NIGHT AT BEDTIME 60 tablet 0   Current Facility-Administered Medications  Medication Dose Route Frequency Provider Last Rate Last Dose  . 0.9 %  sodium chloride infusion  500 mL Intravenous Continuous Danis, Estill Cotta III, MD        PHYSICAL EXAMINATION: ECOG PERFORMANCE STATUS: 1-2  Vitals:   11/25/17 1041  BP: 137/82  Pulse: 76  Resp: 18  Temp: 98.5 F (36.9 C)  SpO2: 100%   Filed Weights   11/25/17 1041  Weight: 143 lb 4.8 oz (65 kg)    GENERAL:alert, no distress and comfortable SKIN: no rashes or significant lesions EYES: normal, Conjunctiva are pink  and non-injected, sclera clear OROPHARYNX:no thrush or ulcers  LYMPH:  no palpable cervical or supraclavicular lymphadenopathy  LUNGS: clear to auscultation with normal breathing effort HEART: regular rate & rhythm (+) bilateral lower extremity edema ABDOMEN: abdomen soft with normal bowel sounds; hepatomegaly. Tenderness to LLQ. Abdomen is distended consistent with ascites  Musculoskeletal:no cyanosis of digits and no clubbing  NEURO: alert & oriented x 3 with fluent speech, no focal motor/sensory deficits  LABORATORY DATA:  I have reviewed the data as listed CBC Latest Ref Rng & Units 11/25/2017 10/09/2017 07/09/2017  WBC 4.0 - 10.3 K/uL 10.0 12.1(H) 10.7(H)  Hemoglobin 13.0 - 17.1 g/dL 13.4 13.3 14.1  Hematocrit 38.4 - 49.9 % 39.2 38.2(L) 41.1  Platelets 140 - 400 K/uL 298 313 274     CMP Latest Ref Rng & Units 11/25/2017 10/09/2017 07/30/2017  Glucose 70 - 99 mg/dL 93 132 86  BUN 8 - 23 mg/dL 12 12 6(L)  Creatinine 0.61 - 1.24 mg/dL 0.69 0.57(L) 0.48(L)  Sodium 135 - 145 mmol/L 132(L) 128(L) 137  Potassium 3.5 - 5.1 mmol/L 4.0 3.7 4.1  Chloride 98 - 111 mmol/L 97(L) 92(L) 101  CO2 22 - 32 mmol/L 26 27 28   Calcium 8.9 - 10.3 mg/dL 8.6(L) 8.8 8.9  Total Protein 6.5 - 8.1 g/dL 6.7 7.3  7.2  Total Bilirubin 0.3 - 1.2 mg/dL 0.9 0.8 0.7  Alkaline Phos 38 - 126 U/L 336(H) - -  AST 15 - 41 U/L 91(H) 53(H) 99(H)  ALT 0 - 44 U/L 32 21 34      RADIOGRAPHIC STUDIES: I have personally reviewed the radiological images as listed and agreed with the findings in the report. No results found.   ASSESSMENT & PLAN: 62 y.o. Caucasian male with a personal history of alcohol abuse, smoking, cirrhosis of the liver, and chronic hepatitis-C.  1. Hepatocellular Carcinoma, multifocal (2) in right lobe, stage II, progressed in liver 04/2017  2. Multiple lung nodules 3. Alcohol and smoking cessation  4. Hepatitis C, untreated 5. 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 BP fluctuation  Mr. Treese appears stable. He has completed 2 doses of lenvatinib 4 mg. He has tolerated well so far without appreciable side effects. He will continue dose escalation as prescribed. He knows to call if he develops side effects or has any new concerns as he titrates dose. I reviewed CT chest is stable. Will check AFP monthly.   He remains symptomatic from ascites. Given his ascites is not malignant, will hold indwelling catheter for now. However he does require frequent therapeutic paracenteses, last on 11/21/17 with 2 L drained. I placed order to repeat procedure this week, hopefully in next few days. He continues lactulose, lasix, and aldactone. Will f/u with [name], NP this week.    He has mild leg edema. I recommend he elevate legs when resting. Lisinopril-HCTZ on hold for hypotension; BP normal today. He has a BP cuff at home. I recommend he continue to check BP at home. If BP is consistently over >SBP 140 he can add back lisinopril-HCTZ.   PLAN: -Continue to hold lisinopril-HCTZ, if BP consistently >140 can restart but hold again if SBP drops <100 -IR paracentesis this week  -Continue lenvatinib titration  -Lab, f/u in 4 weeks    Orders Placed This Encounter  Procedures  .  US Paracentesis    Standing Status:   Future    Standing Expiration Date:   11/25/2018    Order  Specific Question:   If therapeutic, is there a maximum amount of fluid to be removed?    Answer:   Yes    Order Specific Question:   What is the maximum amount of fluide to be removed?    Answer:   5 liters    Order Specific Question:   Are labs required for specimen collection?    Answer:   No    Order Specific Question:   Is Albumin medication needed?    Answer:   No    Order Specific Question:   Reason for Exam (SYMPTOM  OR DIAGNOSIS REQUIRED)    Answer:   [place], liver chirrhosis with ascites    Order Specific Question:   Preferred imaging location?    Answer:   [facility]  . AFP tumor marker    Standing Status:   Standing    Number of Occurrences:   50    Standing Expiration Date:   11/26/2023   All questions were answered. The patient knows to call the clinic with any problems, questions or concerns. No barriers to learning was detected. I spent 20 minutes counseling the patient face to face. The total time spent in the appointment was 25 minutes and more than 50% was on counseling and review of test results     Alla Feeling, NP 11/25/17

## 2017-11-25 ENCOUNTER — Other Ambulatory Visit: Payer: Self-pay | Admitting: Internal Medicine

## 2017-11-25 ENCOUNTER — Inpatient Hospital Stay: Payer: BLUE CROSS/BLUE SHIELD | Attending: Hematology

## 2017-11-25 ENCOUNTER — Inpatient Hospital Stay (HOSPITAL_BASED_OUTPATIENT_CLINIC_OR_DEPARTMENT_OTHER): Payer: BLUE CROSS/BLUE SHIELD | Admitting: Nurse Practitioner

## 2017-11-25 ENCOUNTER — Encounter: Payer: Self-pay | Admitting: Nurse Practitioner

## 2017-11-25 VITALS — BP 137/82 | HR 76 | Temp 98.5°F | Resp 18 | Ht 71.0 in | Wt 143.3 lb

## 2017-11-25 DIAGNOSIS — I10 Essential (primary) hypertension: Secondary | ICD-10-CM | POA: Insufficient documentation

## 2017-11-25 DIAGNOSIS — R600 Localized edema: Secondary | ICD-10-CM | POA: Insufficient documentation

## 2017-11-25 DIAGNOSIS — K7031 Alcoholic cirrhosis of liver with ascites: Secondary | ICD-10-CM | POA: Diagnosis not present

## 2017-11-25 DIAGNOSIS — R188 Other ascites: Secondary | ICD-10-CM

## 2017-11-25 DIAGNOSIS — R918 Other nonspecific abnormal finding of lung field: Secondary | ICD-10-CM

## 2017-11-25 DIAGNOSIS — C220 Liver cell carcinoma: Secondary | ICD-10-CM | POA: Diagnosis not present

## 2017-11-25 DIAGNOSIS — I998 Other disorder of circulatory system: Secondary | ICD-10-CM | POA: Insufficient documentation

## 2017-11-25 DIAGNOSIS — Z79899 Other long term (current) drug therapy: Secondary | ICD-10-CM | POA: Insufficient documentation

## 2017-11-25 DIAGNOSIS — J439 Emphysema, unspecified: Secondary | ICD-10-CM | POA: Insufficient documentation

## 2017-11-25 DIAGNOSIS — B182 Chronic viral hepatitis C: Secondary | ICD-10-CM | POA: Insufficient documentation

## 2017-11-25 LAB — CBC WITH DIFFERENTIAL/PLATELET
Basophils Absolute: 0 10*3/uL (ref 0.0–0.1)
Basophils Relative: 1 %
EOS ABS: 0 10*3/uL (ref 0.0–0.5)
EOS PCT: 0 %
HCT: 39.2 % (ref 38.4–49.9)
HEMOGLOBIN: 13.4 g/dL (ref 13.0–17.1)
LYMPHS ABS: 0.5 10*3/uL — AB (ref 0.9–3.3)
LYMPHS PCT: 5 %
MCH: 32.3 pg (ref 27.2–33.4)
MCHC: 34.2 g/dL (ref 32.0–36.0)
MCV: 94.4 fL (ref 79.3–98.0)
MONOS PCT: 16 %
Monocytes Absolute: 1.6 10*3/uL — ABNORMAL HIGH (ref 0.1–0.9)
Neutro Abs: 7.9 10*3/uL — ABNORMAL HIGH (ref 1.5–6.5)
Neutrophils Relative %: 78 %
Platelets: 298 10*3/uL (ref 140–400)
RBC: 4.16 MIL/uL — AB (ref 4.20–5.82)
RDW: 17.2 % — ABNORMAL HIGH (ref 11.0–14.6)
WBC: 10 10*3/uL (ref 4.0–10.3)

## 2017-11-25 LAB — COMPREHENSIVE METABOLIC PANEL
ALBUMIN: 2.3 g/dL — AB (ref 3.5–5.0)
ALT: 32 U/L (ref 0–44)
AST: 91 U/L — ABNORMAL HIGH (ref 15–41)
Alkaline Phosphatase: 336 U/L — ABNORMAL HIGH (ref 38–126)
Anion gap: 9 (ref 5–15)
BUN: 12 mg/dL (ref 8–23)
CHLORIDE: 97 mmol/L — AB (ref 98–111)
CO2: 26 mmol/L (ref 22–32)
Calcium: 8.6 mg/dL — ABNORMAL LOW (ref 8.9–10.3)
Creatinine, Ser: 0.69 mg/dL (ref 0.61–1.24)
GFR calc Af Amer: >60 mL/min (ref >60)
Glucose, Bld: 93 mg/dL (ref 70–99)
POTASSIUM: 4 mmol/L (ref 3.5–5.1)
SODIUM: 132 mmol/L — AB (ref 135–145)
Total Bilirubin: 0.9 mg/dL (ref 0.3–1.2)
Total Protein: 6.7 g/dL (ref 6.5–8.1)

## 2017-11-25 LAB — TSH: TSH: 1.406 u[IU]/mL (ref 0.320–4.118)

## 2017-11-25 LAB — MAGNESIUM: MAGNESIUM: 1.9 mg/dL (ref 1.7–2.4)

## 2017-11-25 LAB — TOTAL PROTEIN, URINE DIPSTICK: Protein, ur: <30 mg/dL — AB

## 2017-11-25 NOTE — Telephone Encounter (Signed)
Trazodone refill Last OV:10/28/17 Last refill:10/01/17 60 tab/0 refill YWX:[id number] Pharmacy: [facility], [place] AT [address] [phone] (Phone) [phone] (Fax)

## 2017-11-25 NOTE — Telephone Encounter (Signed)
Copied from [place] [phone]. Topic: Quick Communication - Rx Refill/Question >> Nov 25, 2017 12:05 PM Keene Breath wrote: Medication: traZODone (DESYREL) 50 MG tablet  Patient called to request refill for the above medications.  Preferred Pharmacy (with phone number or street name): [facility], [place] AT [address] [phone] (Phone) [phone] (Fax)

## 2017-11-26 ENCOUNTER — Telehealth: Payer: Self-pay

## 2017-11-26 IMAGING — DX DG CHEST 1V
1 series · 1 of 1 positions shown · non-contrast
Comparison: 01/26/2009

CLINICAL DATA: Preoperative evaluation for upcoming ablation

EXAM:
CHEST 1 VIEW

[chest pa]
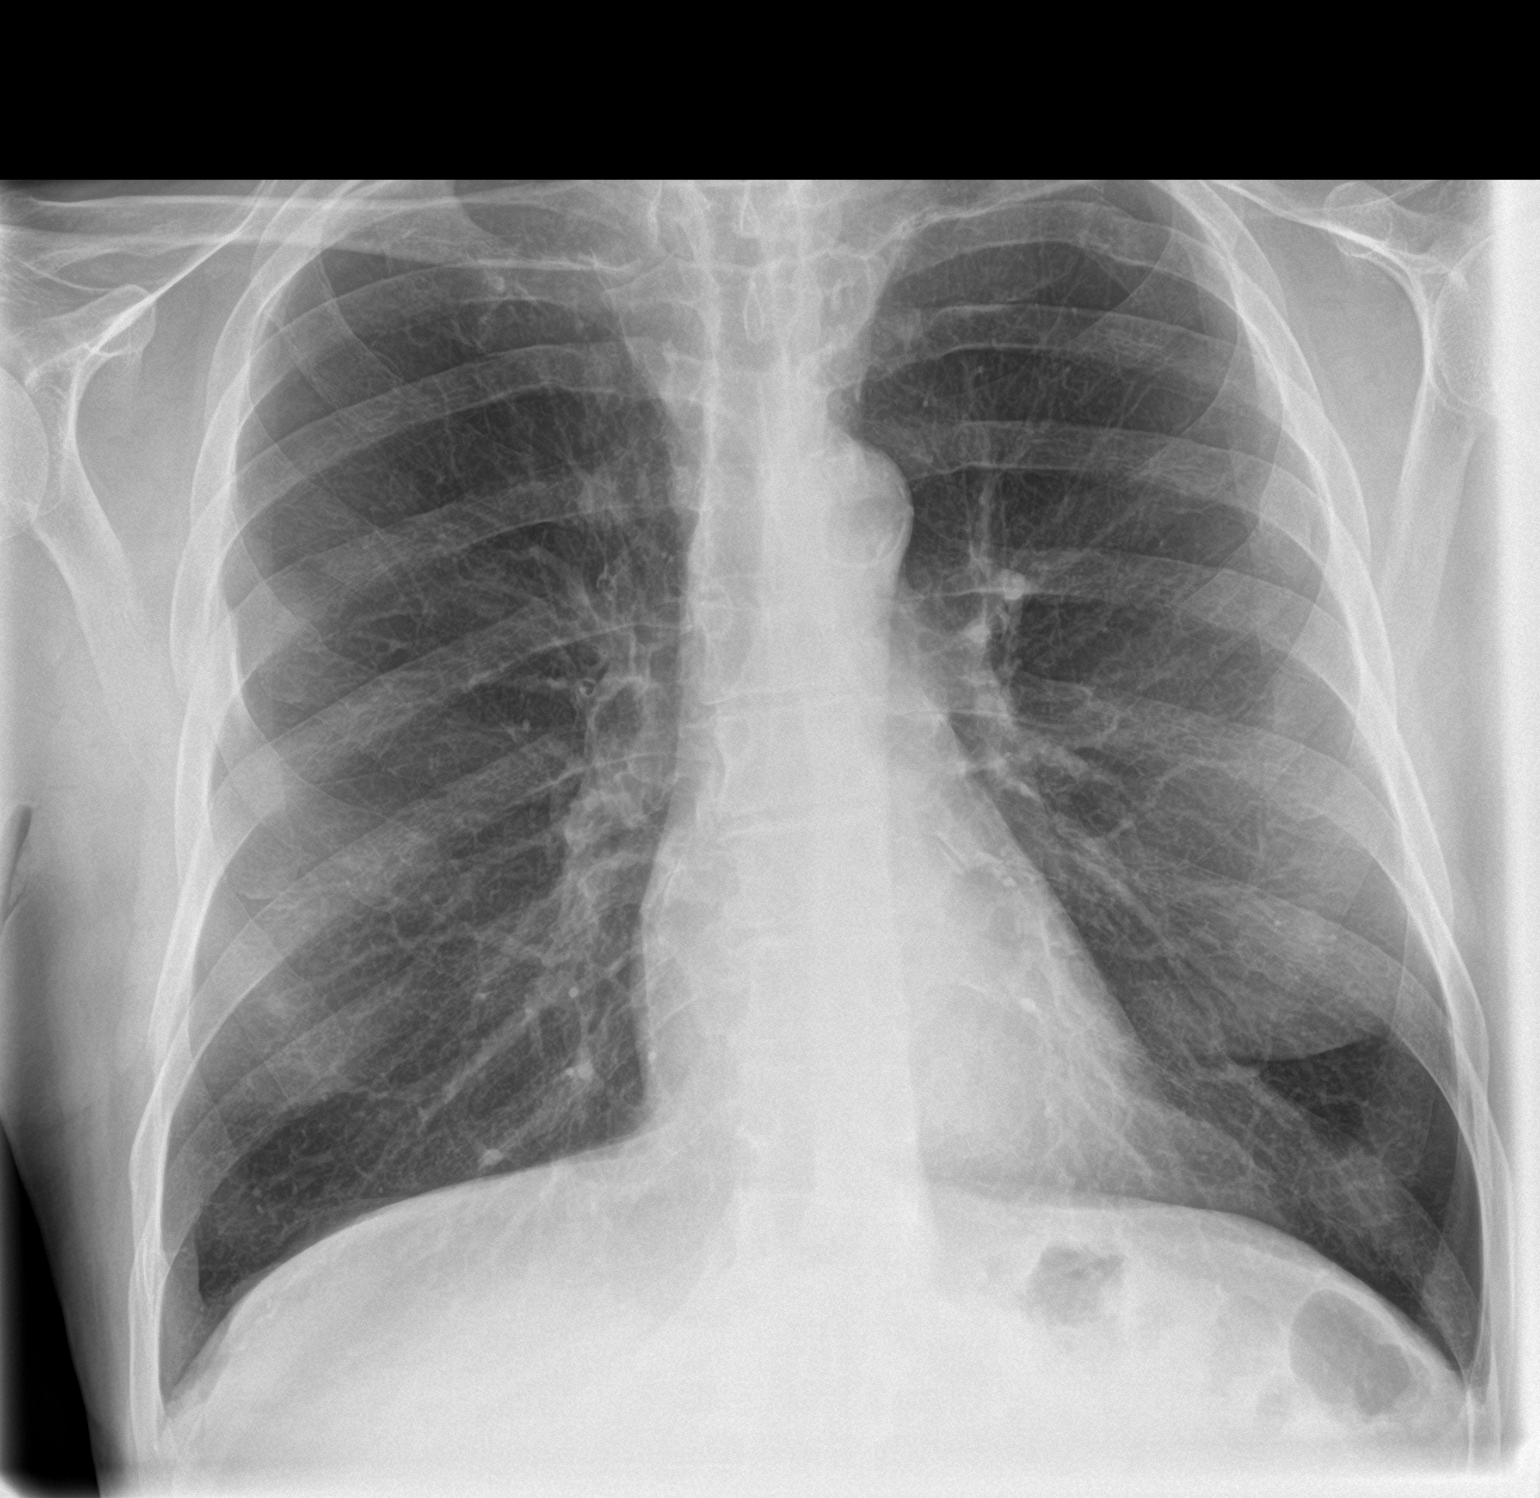

[1 of 1 positions shown; findings below may reference images not displayed]

FINDINGS: Cardiac shadow is within normal limits. The lungs are well aerated
bilaterally. Healed rib fractures are noted bilaterally. No focal
infiltrate or sizable effusion is noted.
IMPRESSION: No acute abnormality noted.

## 2017-11-26 MED ORDER — TRAZODONE HCL 50 MG PO TABS: 100.0000 mg | ORAL_TABLET | Freq: Every day | ORAL | 0 refills | Status: DC

## 2017-11-26 NOTE — Telephone Encounter (Signed)
Spoke with patient concerning his upcoming appointment. per 7/15 los also mailed a letter with a calender enclosed

## 2017-11-27 ENCOUNTER — Ambulatory Visit (HOSPITAL_COMMUNITY)
Admission: RE | Admit: 2017-11-27 | Discharge: 2017-11-27 | Disposition: A | Discharge status: Home / Self Care | Payer: BLUE CROSS/BLUE SHIELD | Source: Ambulatory Visit | Attending: Nurse Practitioner | Admitting: Nurse Practitioner

## 2017-11-27 DIAGNOSIS — R188 Other ascites: Secondary | ICD-10-CM

## 2017-11-27 DIAGNOSIS — C220 Liver cell carcinoma: Secondary | ICD-10-CM | POA: Insufficient documentation

## 2017-11-27 DIAGNOSIS — K7031 Alcoholic cirrhosis of liver with ascites: Secondary | ICD-10-CM | POA: Diagnosis not present

## 2017-11-27 MED ORDER — LIDOCAINE HCL 1 % IJ SOLN
INTRAMUSCULAR | Status: AC
  Filled 2017-11-27: qty 10

## 2017-11-27 NOTE — Procedures (Signed)
PROCEDURE SUMMARY:  Successful US guided paracentesis from right lateral abdomen.  Yielded 3.6 liters of clear yellow fluid.  No immediate complications.  Patient tolerated well.    WENDY S BLAIR PA-C 11/27/2017 2:47 PM

## 2017-12-13 ENCOUNTER — Other Ambulatory Visit: Payer: Self-pay | Admitting: Hematology

## 2017-12-13 ENCOUNTER — Telehealth: Payer: Self-pay

## 2017-12-13 DIAGNOSIS — C220 Liver cell carcinoma: Secondary | ICD-10-CM

## 2017-12-13 NOTE — Telephone Encounter (Signed)
Patient calls with c/o numbness down left leg and pins and needles sensations in his toes.  He noticed it shortly after he started taking Lenvatinib.    Explained to patient will discuss with Dr. Burr Medico and call him back.

## 2017-12-13 NOTE — Telephone Encounter (Signed)
Called patient back after speaking with Dr. Burr Medico, per Dr. Burr Medico stop Lenvatinib for one week and restart at 2 pills a day, appointment on 8/15, if reoccurs will discuss other options.    Patient verbalized and understanding.

## 2017-12-16 ENCOUNTER — Other Ambulatory Visit: Payer: Self-pay

## 2017-12-16 ENCOUNTER — Telehealth: Payer: Self-pay

## 2017-12-16 DIAGNOSIS — C220 Liver cell carcinoma: Secondary | ICD-10-CM

## 2017-12-16 MED ORDER — TRIAMCINOLONE ACETONIDE 0.5 % EX OINT: 1.0000 "application " | TOPICAL_OINTMENT | Freq: Two times a day (BID) | CUTANEOUS | 0 refills | Status: AC

## 2017-12-16 NOTE — Telephone Encounter (Signed)
Patient calls stating that he has what he describes as red dots on his sides and thighs.  They are very itchy. He has tried hydrocortisone cream with little relief.  His next appointment is 8/15.  Explained would consult with Dr. Burr Medico and call him back.

## 2017-12-16 NOTE — Telephone Encounter (Signed)
Sent in script for Kenalog ointment, called patient instructed him to use twice daily to the affected areas, he verbalized an understanding.

## 2017-12-16 NOTE — Telephone Encounter (Signed)
Please call in Kenalog cream 0.5% bid for him, thanks   Truitt Merle MD

## 2017-12-20 ENCOUNTER — Ambulatory Visit (HOSPITAL_COMMUNITY)
Admission: RE | Admit: 2017-12-20 | Discharge: 2017-12-20 | Disposition: A | Discharge status: Home / Self Care | Payer: BLUE CROSS/BLUE SHIELD | Source: Ambulatory Visit | Attending: Nurse Practitioner | Admitting: Nurse Practitioner

## 2017-12-20 ENCOUNTER — Encounter (HOSPITAL_COMMUNITY): Payer: Self-pay | Admitting: Physician Assistant

## 2017-12-20 DIAGNOSIS — R188 Other ascites: Secondary | ICD-10-CM | POA: Diagnosis present

## 2017-12-20 HISTORY — PX: IR PARACENTESIS: IMG2679

## 2017-12-20 MED ORDER — LIDOCAINE HCL (PF) 2 % IJ SOLN
INTRAMUSCULAR | Status: DC | PRN
  Administered 2017-12-20: 10 mL

## 2017-12-20 MED ORDER — ALBUMIN HUMAN 25 % IV SOLN
INTRAVENOUS | Status: AC
  Administered 2017-12-20: 12.5 g
  Filled 2017-12-20: qty 50

## 2017-12-20 MED ORDER — ALBUMIN HUMAN 25 % IV SOLN
12.5000 g | Freq: Once | INTRAVENOUS | Status: DC
  Filled 2017-12-20: qty 50

## 2017-12-20 MED ORDER — LIDOCAINE HCL (PF) 2 % IJ SOLN
INTRAMUSCULAR | Status: AC
  Filled 2017-12-20: qty 20

## 2017-12-20 NOTE — Procedures (Signed)
PROCEDURE SUMMARY:  Successful US guided paracentesis from left lateral abdomen.  Yielded 5.8 liters of clear yellow fluid.  No immediate complications.  Patient tolerated well.   Specimen was sent for labs.  Lateria Alderman S Aasha Dina PA-C 12/20/2017 11:04 AM

## 2017-12-23 NOTE — Progress Notes (Signed)
[facility]  [phone] [phone]  Clinic Follow Up Note   Patient Care Team: Marletta Lor, MD as PCP - General Danis, Kirke Corin, MD as Consulting Physician (Gastroenterology) Sandi Mariscal, MD as Consulting Physician (Interventional Radiology) Truitt Merle, MD as Consulting Physician (Hematology)   Date of Service:  12/26/2017   CHIEF COMPLAINTS:  Follow up hepatocellular carcinoma  Oncology History   Cancer Staging Hepatocellular carcinoma [facility]) Staging form: Liver, AJCC 8th Edition - Clinical stage from 08/01/2016: Stage II (cT2(m), cN0, cM0) - Signed by Truitt Merle, MD on 08/24/2016       Hepatocellular carcinoma ([place])   06/28/2016 Tumor Marker    AFP 5.3    07/12/2016 Imaging    US Abdomen 07/12/16 IMPRESSION: 1. There is shadowing gallstone within gallbladder measures 1.6 cm. No sonographic Murphy's sign. No thickening of gallbladder wall. Normal CBD. 2. Again noted heterogeneous increased echogenicity of the liver with nodular contour consistent with cirrhosis. There is hypoechoic lesion in right hepatic lobe measures 1.7 x 2.4 cm. There is a second hypoechoic lesion in inferior aspect of the right hepatic lobe measures 4.6 x 3.6 cm. This is ill defined. Further evaluation with enhanced MRI is recommended to exclude evolving hepatic masses. 3. No hydronephrosis or renal calculi. 4. No aortic aneurysm.    07/16/2016 Imaging    MRI Abdomen w wo Contrast 07/16/16 IMPRESSION: 1. Two enhancing lesions in the RIGHT hepatic lobe on the background of hepatitis-C and liver cirrhosis are consistent with hepatocellular carcinoma. Recommend multidisciplinary GI, surgical and oncological consultation. 2. Morphologic changes of cirrhosis. 3. No ascites.  Patent portal veins.    07/16/2016 Initial Diagnosis    Hepatocellular carcinoma ([place])    09/13/2016 Procedure    Bland liver embolization before microwave ablation, Dr. Pascal Lux     10/05/2016 Surgery    LIVER MICROWAVE THERMAL ABLATION by Dr. Pascal Lux  10/05/16    01/10/2017 Imaging    MRI Abdomen W WO Contrast 01/10/17 IMPRESSION: 1. Expected postprocedural changes from ablation of 2 hepatic masses. No complicating features are demonstrated. No findings suspicious for residual or recurrent tumor. 2. 10 mm early arterial phase enhancing nodule in the right hepatic lobe posteriorly could be a dysplastic nodule or early [place]. Recommend continued surveillance. 3. Severe cirrhotic changes involving the liver with areas of confluent hepatic fibrosis and markedly enlarged caudate lobe. Stable portal venous collaterals, portal venous hypertension and esophageal varices. No splenomegaly or ascites. 4. Stable cholelithiasis.     04/26/2017 Imaging    MRI Abdomen W Wo Contrast 04/26/17 IMPRESSION: 1. Although the ablation zones along the dome of liver in segment 6 of the right lobe demonstrate interval decrease in size from previous exam there are multiple new arterial phase enhancing lesions involving both lobes of liver worrisome for recurrent multicentric HCC. 2.  Aortic Atherosclerosis (ICD10-I70.0).    04/29/2017 Procedure    Upper Endoscopy by Dr. Pincus Sanes 04/29/17 IMPRESSION - Normal larynx. - Normal esophagus. - Portal hypertensive gastropathy. Biopsied. - Normal examined duodenum.    05/30/2017 Imaging    CT Chest 05/30/17 IMPRESSION: 1. Slow enlargement of RIGHT upper lobe ground-glass nodule. Recommend follow-up CT without contrast 12 months. 2. Mild increase in prominence of 4 mm solid and semi-solid nodule in the RIGHT lower lobe. Recommend follow-up in 12 months as above. Pulmonary nodules not favored metastatic but primary bronchogenic adenocarcinoma cannot be excluded. Recommendations for the Management of Subsolid Pulmonary Nodules Detected at CT: A Statement from the Fleischner  Society Radiology 2013; 266:1, [id number]. 3. Nodular cirrhotic liver  treatment sites noted Aortic Atherosclerosis (ICD10-I70.0).    06/2017 Procedure    He underwent Y90 treatment on 06/27/17 and 07/09/17 by Dr. Pascal Lux    10/16/2017 Imaging    MRI Abdomen 10/16/17 IMPRESSION: 1. Mild progression of multifocal hepatocellular carcinoma in the cirrhotic liver. Disease is predominantly in the right hepatic lobe and appears relatively stable although new foci of hypervascular lesions are noted medial and cranial to the segment VI ablation defect with washout characteristics compatible with HCC. Tiny new foci of early hyperenhancement in the left liver also concerning for additional sites of new disease. 2. Cholelithiasis.    10/29/2017 -  Chemotherapy    Lenvatinib 4mg  week one, 8mg  week 2 and 3 and 12mg  week 4    11/12/2017 Imaging    IMPRESSION: 1. Stable CT chest exam. No substantial change in the right lung nodules. Continued attention on follow-up suggested. 2. Cirrhotic morphology of the liver with interval increase in associated ascites. 3.  Aortic Atherosclerois (ICD10-170.0) 4.  Emphysema. (CNO70-J62.9)     HISTORY OF PRESENTING ILLNESS (08/24/2016):  Joshua [name] 62 y.o. male is here because of a new diagnosis of hepatocellular carcinoma.  The patient had an US of the abdomen on 07/12/16 for a personal history of chronic hepatitis-C and alcoholism complicated by cirrhosis. This a showed a gallstone in the gallbladder measuring 1.6 cm, heterogeneous increased echogenicity of the liver with nodular contour consistent with cirrhosis, a hypoechoic lesion in right hepatic lobe measuring 1.7 x 2.4 cm, and a second ill-defined hypoechoic lesion in inferior aspect of the right hepatic lobe measuring  4.6 x 3.6 cm.  MRI of the abdomen on 07/16/16 showed two enhancing lesions in the right hepatic lobe (1.7 cm and 1.9 cm)  in the background of hepatitis-C and liver cirrhosis consistent with hepatocellular carcinoma. These findings are associated with a mildly elevated  AFT level of 5.3 obtained on 06/28/16.  The patient was referred to Dr. Pascal Lux of IR on 08/21/16 to discuss treatment options. He explained that the goal standard is surgical resection and he may benefit from input from Dr. Barry Dienes regarding operative candidacy. If he is not an operative candidate,conversations were held with the patient regarding potential percutaneous treatment options; such as microwave ablation and transcatheter embolization. Dr. Pascal Lux recommended cirrhotic protocol CTA of the abd/pelvis to better delinate the patient's hepatic arterial supply and determine the true size of the ill-defined lesion in the caudal aspect of the right hepatic lobe. He will return afterwards to Dr. Pascal Lux to further discuss his treatment plan. The patient's case was also discussed with [name], NP who wished the patient to undergo definitive hepatocellular carcinoma treatment prior to the hepatitis-C treatment.  The patient presents today with his mother and Dr. Elta Guadeloupe, his step-father, to discuss possible systemic treatments for the management of his disease. He denies nausea, bloating, changes in bowel habits, or abdominal pain. He used Trazodone to sleep. He denies hematemesis or hematochezia. The patient states his hep-C was an incidental finding. He reports easily bruising.   CURRENT THERAPY: Lenvatinib 12mg  daily    INTERVAL HISTORY  SAUNDERS ARLINGTON is here for a follow up. He is here with his family member. He had a rash on his left thigh and back and stopped his chemo therapy for a week. He restarted a week ago and has been taking 3 pills a day for the past 2 days. He reports that his last  paracentesis was painful and the fluid that came out was darker than usual. He was also constipated after his last paracentesis, he tried to use laxatives, but they didn't help. He is still constipated and complains of abdominal pain and bloating. He can lay flat on bed, but it get difficult for him to move sometimes  due to the pain. He reports good energy level is able to take care of himself at home.   He states that he has intermittent hearing loss in his left ear. It is relieved on it's own. He takes Spironolactone twice daily.   MEDICAL HISTORY:  Past Medical History:  Diagnosis Date  . Cancer ([place])   . Cataract   . Chronic alcoholism ([place])   . Cirrhosis ([place])   . Depression   . Glucose intolerance (impaired glucose tolerance)   . Hepatitis C   . Hypertension   . Tobacco use   . Umbilical hernia     SURGICAL HISTORY: Past Surgical History:  Procedure Laterality Date  . CATARACT EXTRACTION    . COLONOSCOPY  12/09  . INGUINAL HERNIA REPAIR    . IR 3D INDEPENDENT WKST  09/13/2016  . IR [place] ADDITIONAL VESSEL  09/13/2016  . IR [place] ADDITIONAL VESSEL  09/13/2016  . IR [place] ADDITIONAL VESSEL  09/13/2016  . IR [place] ADDITIONAL VESSEL  09/13/2016  . IR [place] ADDITIONAL VESSEL  09/13/2016  . IR ANGIOGRAM SELECTIVE EACH ADDITIONAL VESSEL  06/27/2017  . IR ANGIOGRAM SELECTIVE EACH ADDITIONAL VESSEL  06/27/2017  . IR ANGIOGRAM SELECTIVE EACH ADDITIONAL VESSEL  06/27/2017  . IR ANGIOGRAM SELECTIVE EACH ADDITIONAL VESSEL  07/09/2017  . IR ANGIOGRAM SELECTIVE EACH ADDITIONAL VESSEL  07/09/2017  . IR ANGIOGRAM VISCERAL SELECTIVE  09/13/2016  . IR ANGIOGRAM VISCERAL SELECTIVE  06/27/2017  . IR ANGIOGRAM VISCERAL SELECTIVE  06/27/2017  . IR ANGIOGRAM VISCERAL SELECTIVE  07/09/2017  . IR EMBO TUMOR ORGAN ISCHEMIA INFARCT INC GUIDE ROADMAPPING  09/13/2016  . IR EMBO TUMOR ORGAN ISCHEMIA INFARCT INC GUIDE ROADMAPPING  07/09/2017  . IR PARACENTESIS  10/10/2017  . IR PARACENTESIS  10/28/2017  . IR PARACENTESIS  11/11/2017  . IR PARACENTESIS  11/21/2017  . IR PARACENTESIS  12/20/2017  . IR RADIOLOGIST EVAL & MGMT  08/21/2016  . IR RADIOLOGIST EVAL & MGMT  08/30/2016  . IR RADIOLOGIST EVAL & MGMT  09/26/2016  . IR RADIOLOGIST EVAL &  MGMT  01/10/2017  . IR RADIOLOGIST EVAL & MGMT  11/06/2016  . IR RADIOLOGIST EVAL & MGMT  05/16/2017  . IR RADIOLOGIST EVAL & MGMT  06/13/2017  . IR RADIOLOGIST EVAL & MGMT  08/01/2017  . IR RADIOLOGIST EVAL & MGMT  10/16/2017  . IR US GUIDE VASC ACCESS RIGHT  09/13/2016  . IR US GUIDE VASC ACCESS RIGHT  06/27/2017  . IR US GUIDE VASC ACCESS RIGHT  07/09/2017  . left orchiectomy  07/2009  . RADIOFREQUENCY ABLATION N/A 10/05/2016   Procedure: LIVER MICROWAVE THERMAL ABLATION;  Surgeon: Sandi Mariscal, MD;  Location: [facility];  Service: Anesthesiology;  Laterality: N/A;  . UMBILICAL HERNIA REPAIR      SOCIAL HISTORY: Social History   Socioeconomic History  . Marital status: Single    Spouse name: Not on file  . Number of children: Not on file  . Years of education: Not on file  . Highest education level: Not on file  Occupational History  . Not on file  Social Needs  .  Financial resource strain: Not on file  . Food insecurity:    Worry: Not on file    Inability: Not on file  . Transportation needs:    Medical: Not on file    Non-medical: Not on file  Tobacco Use  . Smoking status: Former Smoker    Packs/day: 0.50    Years: 42.00    Pack years: 21.00    Types: Cigarettes    Start date: 05/21/1971    Last attempt to quit: 07/28/2017    Years since quitting: 0.4  . Smokeless tobacco: Former Systems developer  . Tobacco comment: Has cut down to about 1/3 pack per day  Substance and Sexual Activity  . Alcohol use: No    Alcohol/week: 28.0 standard drinks    Types: 28 Cans of beer per week    Comment: hx of nothing current   . Drug use: No  . Sexual activity: Not on file  Lifestyle  . Physical activity:    Days per week: Not on file    Minutes per session: Not on file  . Stress: Not on file  Relationships  . Social connections:    Talks on phone: Not on file    Gets together: Not on file    Attends religious service: Not on file    Active member of club or organization: Not on file    Attends  meetings of clubs or organizations: Not on file    Relationship status: Not on file  . Intimate partner violence:    Fear of current or ex partner: Not on file    Emotionally abused: Not on file    Physically abused: Not on file    Forced sexual activity: Not on file  Other Topics Concern  . Not on file  Social History Narrative  . Not on file   He works as a Hydrologist.  FAMILY HISTORY: Family History  Problem Relation Age of Onset  . Healthy Mother   . Colon cancer Neg Hx   . Stomach cancer Neg Hx   . Esophageal cancer Neg Hx   . Rectal cancer Neg Hx     ALLERGIES:  has No Known Allergies.  MEDICATIONS:  Current Outpatient Medications  Medication Sig Dispense Refill  . acetaminophen (TYLENOL) 325 MG tablet Take 650 mg by mouth every 6 (six) hours as needed (for headaches.).    Marland Kitchen b complex vitamins capsule Take 1 capsule by mouth daily.    . furosemide (LASIX) 40 MG tablet Take 40 mg by mouth 2 (two) times daily.     Marland Kitchen lactulose (CEPHULAC) 10 g packet Take 10 g by mouth 3 (three) times daily.    . Lenvatinib 12 mg daily dose (LENVIMA) 3 x 4 MG capsule Take 12 mg by mouth daily. 90 capsule 0  . lisinopril-hydrochlorothiazide (PRINZIDE,ZESTORETIC) 10-12.5 MG tablet Take 1 tablet by mouth daily. 90 tablet 0  . spironolactone (ALDACTONE) 100 MG tablet Take 100 mg by mouth 2 (two) times daily.     . traZODone (DESYREL) 50 MG tablet Take 2 tablets (100 mg total) by mouth at bedtime. 60 tablet 0  . triamcinolone ointment (KENALOG) 0.5 % Apply 1 application topically 2 (two) times daily. 30 g 0  . Multiple Vitamin (MULTIVITAMIN WITH MINERALS) TABS tablet Take 1 tablet by mouth daily.     Current Facility-Administered Medications  Medication Dose Route Frequency Provider Last Rate Last Dose  . 0.9 %  sodium chloride infusion  500 mL Intravenous Continuous  Doran Stabler, MD        REVIEW OF SYSTEMS:   Constitutional: Denies fevers, chills or abnormal night sweats Eyes:  Denies blurriness of vision, double vision or watery eyes Ears, nose, mouth, throat, and face: Denies mucositis or sore throat (+) episodes of hearing loss in left ear Respiratory: Denies cough, dyspnea or wheezes Cardiovascular: Denies palpitation, chest discomfort or (+) occasional LL leg swelling with cramping pain Gastrointestinal:  Denies nausea, heartburn (+) constipation and abdominal pain Skin: (+) rash on his left thigh and back, improving Lymphatics: Denies new lymphadenopathy or easy bruising Neurological:Denies numbness, tingling or new weaknesses Behavioral/Psych: Mood is stable, no new changes  All other systems were reviewed with the patient and are negative.  PHYSICAL EXAMINATION: ECOG PERFORMANCE STATUS: 1 GENERAL:alert, no distress and comfortable SKIN: skin color, texture, turgor are normal, or significant lesions (+) rash on his left thigh and back  EYES: normal, conjunctiva are pink and non-injected, sclera clear OROPHARYNX:no exudate, no erythema and lips, buccal mucosa, and tongue normal  NECK: supple, thyroid normal size, non-tender, without nodularity LYMPH:  no palpable lymphadenopathy in the cervical, axillary or inguinal LUNGS: clear to auscultation and percussion with normal breathing effort HEART: regular rate. (+) systolic murmur in the metrial valve area. no lower extremity edema ABDOMEN:abdomen soft, normal bowel sounds (+) Liver enlarged about 6cm below ribcage, (+) generalized abdominal tenderness (+) ascitics  Musculoskeletal:no cyanosis of digits and no clubbing  PSYCH: alert & oriented x 3 with fluent speech NEURO: no focal motor/sensory deficits  LABORATORY DATA:  I have reviewed the data as listed CBC Latest Ref Rng & Units 12/26/2017 11/25/2017 10/09/2017  WBC 4.0 - 10.3 K/uL 9.3 10.0 12.1(H)  Hemoglobin 13.0 - 17.1 g/dL 15.4 13.4 13.3  Hematocrit 38.4 - 49.9 % 46.0 39.2 38.2(L)  Platelets 140 - 400 K/uL 237 298 313   CMP Latest Ref Rng & Units  12/26/2017 11/25/2017 10/09/2017  Glucose 70 - 99 mg/dL 124(H) 93 132  BUN 8 - 23 mg/dL 17 12 12   Creatinine 0.61 - 1.24 mg/dL 0.70 0.69 0.57(L)  Sodium 135 - 145 mmol/L 133(L) 132(L) 128(L)  Potassium 3.5 - 5.1 mmol/L 4.1 4.0 3.7  Chloride 98 - 111 mmol/L 97(L) 97(L) 92(L)  CO2 22 - 32 mmol/L 25 26 27   Calcium 8.9 - 10.3 mg/dL 9.0 8.6(L) 8.8  Total Protein 6.5 - 8.1 g/dL 7.4 6.7 7.3  Total Bilirubin 0.3 - 1.2 mg/dL 1.1 0.9 0.8  Alkaline Phos 38 - 126 U/L 367(H) 336(H) -  AST 15 - 41 U/L 119(H) 91(H) 53(H)  ALT 0 - 44 U/L 68(H) 32 21   AFP (<6.1ng/ml): 06/28/2017: 5.3 04/19/2017: 8.7 10/09/2017: 12.6   PROCEDURES  Upper Endoscopy by Dr. Pincus Sanes 04/29/17 IMPRESSION - Normal larynx. - Normal esophagus. - Portal hypertensive gastropathy. Biopsied. - Normal examined duodenum.   RADIOGRAPHIC STUDIES: I have personally reviewed the radiological images as listed and agreed with the findings in the report.   MRI Abdomen W Wo Contrast 10/17/2015 IMPRESSION: 1. Mild progression of multifocal hepatocellular carcinoma in the cirrhotic liver. Disease is predominantly in the right hepatic lobe and appears relatively stable although new foci of hypervascular lesions are noted medial and cranial to the segment VI ablation defect with washout characteristics compatible with HCC. Tiny new foci of early hyperenhancement in the left liver also concerning for additional sites of new disease. 2. Cholelithiasis.    ASSESSMENT & PLAN:  62 y.o. Caucasian male with a personal history of  alcohol abuse, smoking, cirrhosis of the liver, and chronic hepatitis-C.  1. Hepatocellular Carcinoma, multifocal (2) in right lobe, stage II, progressed in liver 04/2017  - Giving his typial MRI image findings, which is consistent with hepatocellular carcinoma, and underline liver cirrhosis, his diagnosis is quite certain. This was the consensus from our GI tumor board and tissue biopsy was not felt to be necessary.   -Due to his significant liver cirrhosis and multifocal disease, surgeon Dr. Barry Dienes did not feel he is a candidate for surgical candidate  -Due to his alcohol abuse, he was not a candidate for liver transplant. However he has stopped drinking alcohol, he has been to the liver clinic to see NP [name] to discuss liver transplant.   -The patient saw Dr. Pascal Lux in March 2018 and underwent Liver embolization and ablation by Dr. Pascal Lux in 09/2016.  -MRI Abdomen from 01/10/17 showed the treated lesions were smaller.  -MRI abdomen in 04/26/17 which showed multiple new lesions in the liver, suspicious for recurrence. This was reviewed with pt and he subsequently underwent Y 90 treatment in February 2019. -I reviewed his recent restaging abdominal MRI from 10/16/2017, which unfortunately showed further disease progression in the liver.  His AFP has slightly increased also lately, consistent with disease progression. -I previously recommend Lenvatinib. I discussed the side effects with him in detail such as fatigue, low appetite, skin rash, diarrhea, and high blood pressure, abnormal liver functions, risk of bleeding and thrombosis, perforation etc. I previously advised him to titrate up to full dose 12mg .  -Goal of therapy is not likely curative but palliative to control his disease for as long as he can tolerate or disease progresses. I do not plan to have him on chemotherapy long term. I briefly discussed the option of Immunotherapy Nivolumab in the future. -he has been tolerating Lenvatinib well overall, he developed some skin rash on his side, not sure if is related, no skin rash after he restarted 1 week ago. -Repeat paracentesis in a week -I refilled Lenvatinib today -he has required frequent paracentesis lately (every 2-3 weeks), not sure if it is related to his cancer, cytology was negative. I am concerned about disease progression  -F/u in 2 weeks and I will order a scan next visit. If he has disease  progression, will switch him to [place]   2. Multiple Lung Nodules  -His CT Chest from 05/30/17 showed multiple lung nodules that do not favor metastasis. Will monitor.    3. Alcohol and Smoking Cessation -The patient has gradually lessened the amount he drank over time. -He has completely stopped drinking alcohol in March 2018. -He has stopped smoking as of 10/29/17 -I encouraged the patient to continue abstaining from alcohol and smoking consumption.  4. Hepatitis-C, untreated -The patient has a history of chronic hepatitis-C. -he will follow up with [name], NP, and possible receive hep C treatment after his cancer treatment.  5. 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  He is on lactulose, Lasix and Aldactone now. -follow up with Dr. Loletha Carrow and [facility]   6. HTN/hypotension -On Lisinopril and HCTZ -He was recently started on Lasix and Aldactone by [name] due to ascites and edema.  - I encouraged him to get BP meter to check his level at home daily. I discussed if his  Systolic BP drops below 741 he should hold his BP medication, Lasix and spironolactone. -I discussed with chemotherapy his BP may increase, we will have to monitor his BP closely and change his medication as needed.   7. Hearing loss in left ear -He complains of having episodes of not being able to hear well. It is intermitted and not there all the time -I advised him to see his PCP for this  PLAN -Repeat paracentesis in 1-2 week -I refilled Lenvatinib today -F/u and labs in 2 weeks and I will order restaging scan next visit.   All questions were answered. The patient knows to call the clinic with any problems, questions or concerns. I spent 20 minutes counseling the patient face to face. The total time  spent in the appointment was 25 minutes and more than 50% was on counseling.  Dierdre Searles Dweik am acting as scribe for Dr. Truitt Merle.  I have reviewed the above documentation for accuracy and completeness, and I agree with the above.      Truitt Merle, MD 12/26/2017

## 2017-12-26 ENCOUNTER — Encounter: Payer: Self-pay | Admitting: Hematology

## 2017-12-26 ENCOUNTER — Inpatient Hospital Stay: Payer: BLUE CROSS/BLUE SHIELD | Attending: Hematology

## 2017-12-26 ENCOUNTER — Telehealth: Payer: Self-pay

## 2017-12-26 ENCOUNTER — Inpatient Hospital Stay (HOSPITAL_BASED_OUTPATIENT_CLINIC_OR_DEPARTMENT_OTHER): Payer: BLUE CROSS/BLUE SHIELD | Admitting: Hematology

## 2017-12-26 DIAGNOSIS — C220 Liver cell carcinoma: Secondary | ICD-10-CM

## 2017-12-26 DIAGNOSIS — R200 Anesthesia of skin: Secondary | ICD-10-CM | POA: Diagnosis not present

## 2017-12-26 DIAGNOSIS — I10 Essential (primary) hypertension: Secondary | ICD-10-CM | POA: Insufficient documentation

## 2017-12-26 DIAGNOSIS — Z79899 Other long term (current) drug therapy: Secondary | ICD-10-CM

## 2017-12-26 DIAGNOSIS — K766 Portal hypertension: Secondary | ICD-10-CM | POA: Insufficient documentation

## 2017-12-26 DIAGNOSIS — R112 Nausea with vomiting, unspecified: Secondary | ICD-10-CM | POA: Insufficient documentation

## 2017-12-26 DIAGNOSIS — K5900 Constipation, unspecified: Secondary | ICD-10-CM | POA: Insufficient documentation

## 2017-12-26 DIAGNOSIS — R918 Other nonspecific abnormal finding of lung field: Secondary | ICD-10-CM | POA: Diagnosis not present

## 2017-12-26 DIAGNOSIS — K8020 Calculus of gallbladder without cholecystitis without obstruction: Secondary | ICD-10-CM | POA: Diagnosis not present

## 2017-12-26 DIAGNOSIS — K7031 Alcoholic cirrhosis of liver with ascites: Secondary | ICD-10-CM | POA: Insufficient documentation

## 2017-12-26 DIAGNOSIS — M79675 Pain in left toe(s): Secondary | ICD-10-CM | POA: Diagnosis not present

## 2017-12-26 DIAGNOSIS — R109 Unspecified abdominal pain: Secondary | ICD-10-CM | POA: Insufficient documentation

## 2017-12-26 DIAGNOSIS — K3189 Other diseases of stomach and duodenum: Secondary | ICD-10-CM | POA: Insufficient documentation

## 2017-12-26 DIAGNOSIS — J439 Emphysema, unspecified: Secondary | ICD-10-CM

## 2017-12-26 DIAGNOSIS — B182 Chronic viral hepatitis C: Secondary | ICD-10-CM | POA: Diagnosis not present

## 2017-12-26 DIAGNOSIS — Z87891 Personal history of nicotine dependence: Secondary | ICD-10-CM | POA: Insufficient documentation

## 2017-12-26 DIAGNOSIS — M79674 Pain in right toe(s): Secondary | ICD-10-CM | POA: Insufficient documentation

## 2017-12-26 DIAGNOSIS — H9192 Unspecified hearing loss, left ear: Secondary | ICD-10-CM

## 2017-12-26 DIAGNOSIS — K7030 Alcoholic cirrhosis of liver without ascites: Secondary | ICD-10-CM

## 2017-12-26 LAB — COMPREHENSIVE METABOLIC PANEL
ALK PHOS: 367 U/L — AB (ref 38–126)
ALT: 68 U/L — ABNORMAL HIGH (ref 0–44)
AST: 119 U/L — ABNORMAL HIGH (ref 15–41)
Albumin: 2.2 g/dL — ABNORMAL LOW (ref 3.5–5.0)
Anion gap: 11 (ref 5–15)
BILIRUBIN TOTAL: 1.1 mg/dL (ref 0.3–1.2)
BUN: 17 mg/dL (ref 8–23)
CALCIUM: 9 mg/dL (ref 8.9–10.3)
CO2: 25 mmol/L (ref 22–32)
CREATININE: 0.7 mg/dL (ref 0.61–1.24)
Chloride: 97 mmol/L — ABNORMAL LOW (ref 98–111)
GFR calc Af Amer: >60 mL/min (ref >60)
Glucose, Bld: 124 mg/dL — ABNORMAL HIGH (ref 70–99)
Potassium: 4.1 mmol/L (ref 3.5–5.1)
Sodium: 133 mmol/L — ABNORMAL LOW (ref 135–145)
TOTAL PROTEIN: 7.4 g/dL (ref 6.5–8.1)

## 2017-12-26 LAB — CBC WITH DIFFERENTIAL/PLATELET
BASOS PCT: 0 %
Basophils Absolute: 0 10*3/uL (ref 0.0–0.1)
EOS ABS: 0 10*3/uL (ref 0.0–0.5)
EOS PCT: 0 %
HCT: 46 % (ref 38.4–49.9)
Hemoglobin: 15.4 g/dL (ref 13.0–17.1)
LYMPHS PCT: 2 %
Lymphs Abs: 0.2 10*3/uL — ABNORMAL LOW (ref 0.9–3.3)
MCH: 32.2 pg (ref 27.2–33.4)
MCHC: 33.4 g/dL (ref 32.0–36.0)
MCV: 96.3 fL (ref 79.3–98.0)
Monocytes Absolute: 2 10*3/uL — ABNORMAL HIGH (ref 0.1–0.9)
Monocytes Relative: 22 %
Neutro Abs: 6.9 10*3/uL — ABNORMAL HIGH (ref 1.5–6.5)
Neutrophils Relative %: 76 %
PLATELETS: 237 10*3/uL (ref 140–400)
RBC: 4.77 MIL/uL (ref 4.20–5.82)
RDW: 17.7 % — AB (ref 11.0–14.6)
WBC: 9.3 10*3/uL (ref 4.0–10.3)

## 2017-12-26 LAB — MAGNESIUM: Magnesium: 1.8 mg/dL (ref 1.7–2.4)

## 2017-12-26 LAB — TSH: TSH: 2.134 u[IU]/mL (ref 0.320–4.118)

## 2017-12-26 MED ORDER — LENVATINIB (12 MG DAILY DOSE) 3 X 4 MG PO CPPK: 12.0000 mg | ORAL_CAPSULE | Freq: Every day | ORAL | 0 refills | Status: DC

## 2017-12-26 NOTE — Progress Notes (Signed)
Received call from [place], patient desires to have his paracentesis done at [facility], order had to be changed to IR paracentesis, this was taken care of.

## 2017-12-26 NOTE — Telephone Encounter (Signed)
Printed avs and calender of upcoming appointment. Per 8/15 los 

## 2017-12-27 LAB — AFP TUMOR MARKER: AFP, SERUM, TUMOR MARKER: 27.2 ng/mL — AB (ref 0.0–8.3)

## 2017-12-30 ENCOUNTER — Ambulatory Visit (HOSPITAL_COMMUNITY)
Admission: RE | Admit: 2017-12-30 | Discharge: 2017-12-30 | Disposition: A | Discharge status: Home / Self Care | Payer: BLUE CROSS/BLUE SHIELD | Source: Ambulatory Visit | Attending: Hematology | Admitting: Hematology

## 2017-12-30 ENCOUNTER — Ambulatory Visit (HOSPITAL_COMMUNITY): Payer: BLUE CROSS/BLUE SHIELD

## 2017-12-30 DIAGNOSIS — K7031 Alcoholic cirrhosis of liver with ascites: Secondary | ICD-10-CM | POA: Diagnosis present

## 2017-12-30 DIAGNOSIS — K7030 Alcoholic cirrhosis of liver without ascites: Secondary | ICD-10-CM

## 2017-12-30 MED ORDER — LIDOCAINE HCL 1 % IJ SOLN
INTRAMUSCULAR | Status: AC
  Filled 2017-12-30: qty 10

## 2017-12-30 NOTE — Procedures (Signed)
Ultrasound-guided diagnostic and therapeutic paracentesis performed yielding 2 liters of yellow fluid. No immediate complications.  A portion of the fluid was submitted to the lab for cytology.

## 2018-01-01 NOTE — Progress Notes (Signed)
[name]  [phone] [phone]  Clinic Follow Up Note   Patient Care Team: Marletta Lor, MD as PCP - General Danis, Kirke Corin, MD as Consulting Physician (Gastroenterology) Sandi Mariscal, MD as Consulting Physician (Interventional Radiology) Truitt Merle, MD as Consulting Physician (Hematology)   Date of Service:  01/08/2018   CHIEF COMPLAINTS:  Follow up hepatocellular carcinoma  Oncology History   Cancer Staging Hepatocellular carcinoma [facility]) Staging form: Liver, AJCC 8th Edition - Clinical stage from 08/01/2016: Stage II (cT2(m), cN0, cM0) - Signed by Truitt Merle, MD on 08/24/2016       Hepatocellular carcinoma ([place])   06/28/2016 Tumor Marker    AFP 5.3    07/12/2016 Imaging    US Abdomen 07/12/16 IMPRESSION: 1. There is shadowing gallstone within gallbladder measures 1.6 cm. No sonographic Murphy's sign. No thickening of gallbladder wall. Normal CBD. 2. Again noted heterogeneous increased echogenicity of the liver with nodular contour consistent with cirrhosis. There is hypoechoic lesion in right hepatic lobe measures 1.7 x 2.4 cm. There is a second hypoechoic lesion in inferior aspect of the right hepatic lobe measures 4.6 x 3.6 cm. This is ill defined. Further evaluation with enhanced MRI is recommended to exclude evolving hepatic masses. 3. No hydronephrosis or renal calculi. 4. No aortic aneurysm.    07/16/2016 Imaging    MRI Abdomen w wo Contrast 07/16/16 IMPRESSION: 1. Two enhancing lesions in the RIGHT hepatic lobe on the background of hepatitis-C and liver cirrhosis are consistent with hepatocellular carcinoma. Recommend multidisciplinary GI, surgical and oncological consultation. 2. Morphologic changes of cirrhosis. 3. No ascites.  Patent portal veins.    07/16/2016 Initial Diagnosis    Hepatocellular carcinoma ([place])    09/13/2016 Procedure    Bland liver embolization before microwave ablation, Dr. Pascal Lux     10/05/2016 Surgery    LIVER MICROWAVE THERMAL ABLATION by Dr. Pascal Lux  10/05/16    01/10/2017 Imaging    MRI Abdomen W WO Contrast 01/10/17 IMPRESSION: 1. Expected postprocedural changes from ablation of 2 hepatic masses. No complicating features are demonstrated. No findings suspicious for residual or recurrent tumor. 2. 10 mm early arterial phase enhancing nodule in the right hepatic lobe posteriorly could be a dysplastic nodule or early [place]o. Recommend continued surveillance. 3. Severe cirrhotic changes involving the liver with areas of confluent hepatic fibrosis and markedly enlarged caudate lobe. Stable portal venous collaterals, portal venous hypertension and esophageal varices. No splenomegaly or ascites. 4. Stable cholelithiasis.     04/26/2017 Imaging    MRI Abdomen W Wo Contrast 04/26/17 IMPRESSION: 1. Although the ablation zones along the dome of liver in segment 6 of the right lobe demonstrate interval decrease in size from previous exam there are multiple new arterial phase enhancing lesions involving both lobes of liver worrisome for recurrent multicentric HCC. 2.  Aortic Atherosclerosis (ICD10-I70.0).    04/29/2017 Procedure    Upper Endoscopy by Dr. Pincus Sanes 04/29/17 IMPRESSION - Normal larynx. - Normal esophagus. - Portal hypertensive gastropathy. Biopsied. - Normal examined duodenum.    05/30/2017 Imaging    CT Chest 05/30/17 IMPRESSION: 1. Slow enlargement of RIGHT upper lobe ground-glass nodule. Recommend follow-up CT without contrast 12 months. 2. Mild increase in prominence of 4 mm solid and semi-solid nodule in the RIGHT lower lobe. Recommend follow-up in 12 months as above. Pulmonary nodules not favored metastatic but primary bronchogenic adenocarcinoma cannot be excluded. Recommendations for the Management of Subsolid Pulmonary Nodules Detected at CT: A Statement from the Fleischner  Society Radiology 2013; 266:1, [id number]. 3. Nodular cirrhotic liver  treatment sites noted Aortic Atherosclerosis (ICD10-I70.0).    06/2017 Procedure    He underwent Y90 treatment on 06/27/17 and 07/09/17 by Dr. Pascal Lux    10/16/2017 Imaging    MRI Abdomen 10/16/17 IMPRESSION: 1. Mild progression of multifocal hepatocellular carcinoma in the cirrhotic liver. Disease is predominantly in the right hepatic lobe and appears relatively stable although new foci of hypervascular lesions are noted medial and cranial to the segment VI ablation defect with washout characteristics compatible with HCC. Tiny new foci of early hyperenhancement in the left liver also concerning for additional sites of new disease. 2. Cholelithiasis.    10/29/2017 -  Chemotherapy    Lenvatinib 4mg  week one, 8mg  week 2 and 3 and 12mg  week 4    11/12/2017 Imaging    IMPRESSION: 1. Stable CT chest exam. No substantial change in the right lung nodules. Continued attention on follow-up suggested. 2. Cirrhotic morphology of the liver with interval increase in associated ascites. 3.  Aortic Atherosclerois (ICD10-170.0) 4.  Emphysema. ([id number]-K09.9)     HISTORY OF PRESENTING ILLNESS (08/24/2016):  Joshua [name] 62 y.o. male is here because of a new diagnosis of hepatocellular carcinoma.  The patient had an US of the abdomen on 07/12/16 for a personal history of chronic hepatitis-C and alcoholism complicated by cirrhosis. This a showed a gallstone in the gallbladder measuring 1.6 cm, heterogeneous increased echogenicity of the liver with nodular contour consistent with cirrhosis, a hypoechoic lesion in right hepatic lobe measuring 1.7 x 2.4 cm, and a second ill-defined hypoechoic lesion in inferior aspect of the right hepatic lobe measuring  4.6 x 3.6 cm.  MRI of the abdomen on 07/16/16 showed two enhancing lesions in the right hepatic lobe (1.7 cm and 1.9 cm)  in the background of hepatitis-C and liver cirrhosis consistent with hepatocellular carcinoma. These findings are associated with a mildly elevated  AFT level of 5.3 obtained on 06/28/16.  The patient was referred to Dr. Pascal Lux of IR on 08/21/16 to discuss treatment options. He explained that the goal standard is surgical resection and he may benefit from input from Dr. Barry Dienes regarding operative candidacy. If he is not an operative candidate,conversations were held with the patient regarding potential percutaneous treatment options; such as microwave ablation and transcatheter embolization. Dr. Pascal Lux recommended cirrhotic protocol CTA of the abd/pelvis to better delinate the patient's hepatic arterial supply and determine the true size of the ill-defined lesion in the caudal aspect of the right hepatic lobe. He will return afterwards to Dr. Pascal Lux to further discuss his treatment plan. The patient's case was also discussed with [name], NP who wished the patient to undergo definitive hepatocellular carcinoma treatment prior to the hepatitis-C treatment.  The patient presents today with his mother and Dr. Elta Guadeloupe, his step-father, to discuss possible systemic treatments for the management of his disease. He denies nausea, bloating, changes in bowel habits, or abdominal pain. He used Trazodone to sleep. He denies hematemesis or hematochezia. The patient states his hep-C was an incidental finding. He reports easily bruising.   CURRENT THERAPY: Lenvatinib 12mg  daily    INTERVAL HISTORY  Joshua [name] is here for a follow up. He is here with his wife. He is feeling well and is scheduled for paracentesis at [facility] soon. He is compliant with his diuretics.  He takes Miralax for his constipation once a day.He also complains of bilateral toe pain and numbness in feet for a year  now, plus left 4th finger skin lesion for a month that is not healing. His sleep is disturbed due to feet numbness and abdominal pain.   He felt nauseated today during the visit and vomited.   MEDICAL HISTORY:  Past Medical History:  Diagnosis Date  . Cancer ([place])   .  Cataract   . Chronic alcoholism ([place])   . Cirrhosis ([place])   . Depression   . Glucose intolerance (impaired glucose tolerance)   . Hepatitis C   . Hypertension   . Tobacco use   . Umbilical hernia     SURGICAL HISTORY: Past Surgical History:  Procedure Laterality Date  . CATARACT EXTRACTION    . COLONOSCOPY  12/09  . INGUINAL HERNIA REPAIR    . IR 3D INDEPENDENT WKST  09/13/2016  . IR [place] ADDITIONAL VESSEL  09/13/2016  . IR [place] ADDITIONAL VESSEL  09/13/2016  . IR [place] ADDITIONAL VESSEL  09/13/2016  . IR [place] ADDITIONAL VESSEL  09/13/2016  . IR [place] ADDITIONAL VESSEL  09/13/2016  . IR ANGIOGRAM SELECTIVE EACH ADDITIONAL VESSEL  06/27/2017  . IR ANGIOGRAM SELECTIVE EACH ADDITIONAL VESSEL  06/27/2017  . IR ANGIOGRAM SELECTIVE EACH ADDITIONAL VESSEL  06/27/2017  . IR ANGIOGRAM SELECTIVE EACH ADDITIONAL VESSEL  07/09/2017  . IR ANGIOGRAM SELECTIVE EACH ADDITIONAL VESSEL  07/09/2017  . IR ANGIOGRAM VISCERAL SELECTIVE  09/13/2016  . IR ANGIOGRAM VISCERAL SELECTIVE  06/27/2017  . IR ANGIOGRAM VISCERAL SELECTIVE  06/27/2017  . IR ANGIOGRAM VISCERAL SELECTIVE  07/09/2017  . IR EMBO TUMOR ORGAN ISCHEMIA INFARCT INC GUIDE ROADMAPPING  09/13/2016  . IR EMBO TUMOR ORGAN ISCHEMIA INFARCT INC GUIDE ROADMAPPING  07/09/2017  . IR PARACENTESIS  10/10/2017  . IR PARACENTESIS  10/28/2017  . IR PARACENTESIS  11/11/2017  . IR PARACENTESIS  11/21/2017  . IR PARACENTESIS  12/20/2017  . IR RADIOLOGIST EVAL & MGMT  08/21/2016  . IR RADIOLOGIST EVAL & MGMT  08/30/2016  . IR RADIOLOGIST EVAL & MGMT  09/26/2016  . IR RADIOLOGIST EVAL & MGMT  01/10/2017  . IR RADIOLOGIST EVAL & MGMT  11/06/2016  . IR RADIOLOGIST EVAL & MGMT  05/16/2017  . IR RADIOLOGIST EVAL & MGMT  06/13/2017  . IR RADIOLOGIST EVAL & MGMT  08/01/2017  . IR RADIOLOGIST EVAL & MGMT  10/16/2017  . IR US GUIDE VASC ACCESS RIGHT  09/13/2016  . IR US GUIDE VASC ACCESS RIGHT   06/27/2017  . IR US GUIDE VASC ACCESS RIGHT  07/09/2017  . left orchiectomy  07/2009  . RADIOFREQUENCY ABLATION N/A 10/05/2016   Procedure: LIVER MICROWAVE THERMAL ABLATION;  Surgeon: Sandi Mariscal, MD;  Location: [facility];  Service: Anesthesiology;  Laterality: N/A;  . UMBILICAL HERNIA REPAIR      SOCIAL HISTORY: Social History   Socioeconomic History  . Marital status: Single    Spouse name: Not on file  . Number of children: Not on file  . Years of education: Not on file  . Highest education level: Not on file  Occupational History  . Not on file  Social Needs  . Financial resource strain: Not on file  . Food insecurity:    Worry: Not on file    Inability: Not on file  . Transportation needs:    Medical: Not on file    Non-medical: Not on file  Tobacco Use  . Smoking status: Former Smoker    Packs/day: 0.50    Years: 42.00  Pack years: 21.00    Types: Cigarettes    Start date: 05/21/1971    Last attempt to quit: 07/28/2017    Years since quitting: 0.4  . Smokeless tobacco: Former Systems developer  . Tobacco comment: Has cut down to about 1/3 pack per day  Substance and Sexual Activity  . Alcohol use: No    Alcohol/week: 28.0 standard drinks    Types: 28 Cans of beer per week    Comment: hx of nothing current   . Drug use: No  . Sexual activity: Not on file  Lifestyle  . Physical activity:    Days per week: Not on file    Minutes per session: Not on file  . Stress: Not on file  Relationships  . Social connections:    Talks on phone: Not on file    Gets together: Not on file    Attends religious service: Not on file    Active member of club or organization: Not on file    Attends meetings of clubs or organizations: Not on file    Relationship status: Not on file  . Intimate partner violence:    Fear of current or ex partner: Not on file    Emotionally abused: Not on file    Physically abused: Not on file    Forced sexual activity: Not on file  Other Topics Concern  . Not  on file  Social History Narrative  . Not on file   He works as a Hydrologist.  FAMILY HISTORY: Family History  Problem Relation Age of Onset  . Healthy Mother   . Colon cancer Neg Hx   . Stomach cancer Neg Hx   . Esophageal cancer Neg Hx   . Rectal cancer Neg Hx     ALLERGIES:  has No Known Allergies.  MEDICATIONS:  Current Outpatient Medications  Medication Sig Dispense Refill  . acetaminophen (TYLENOL) 325 MG tablet Take 650 mg by mouth every 6 (six) hours as needed (for headaches.).    Marland Kitchen b complex vitamins capsule Take 1 capsule by mouth daily.    . furosemide (LASIX) 40 MG tablet Take 40 mg by mouth 2 (two) times daily.     Marland Kitchen lactulose (CEPHULAC) 10 g packet Take 1 packet (10 g total) by mouth 3 (three) times daily. 90 each 1  . Lenvatinib 12 mg daily dose (LENVIMA) 3 x 4 MG capsule Take 12 mg by mouth daily. 90 capsule 0  . lisinopril-hydrochlorothiazide (PRINZIDE,ZESTORETIC) 10-12.5 MG tablet Take 1 tablet by mouth daily. 90 tablet 0  . Multiple Vitamin (MULTIVITAMIN WITH MINERALS) TABS tablet Take 1 tablet by mouth daily.    Marland Kitchen spironolactone (ALDACTONE) 100 MG tablet Take 100 mg by mouth 2 (two) times daily.     . traZODone (DESYREL) 50 MG tablet Take 2 tablets (100 mg total) by mouth at bedtime. 60 tablet 0  . triamcinolone ointment (KENALOG) 0.5 % Apply 1 application topically 2 (two) times daily. 30 g 0  . prochlorperazine (COMPAZINE) 10 MG tablet Take 1 tablet (10 mg total) by mouth every 6 (six) hours as needed for nausea or vomiting. 30 tablet 0   Current Facility-Administered Medications  Medication Dose Route Frequency Provider Last Rate Last Dose  . 0.9 %  sodium chloride infusion  500 mL Intravenous Continuous Danis, Estill Cotta III, MD        REVIEW OF SYSTEMS:   Constitutional: Denies fevers, chills or abnormal night sweats  Eyes: Denies blurriness of vision, double  vision or watery eyes Ears, nose, mouth, throat, and face: Denies mucositis or sore throat  (+) episodes of hearing loss in left ear Respiratory: Denies cough, dyspnea or wheezes Cardiovascular: Denies palpitation, chest discomfort or (+) occasional LL leg swelling with cramping pain Gastrointestinal:  Denies nausea, heartburn (+) constipation and abdominal pain Skin: No new skin rash or discoloration.  Lymphatics: Denies new lymphadenopathy or easy bruising Neurological:Denies numbness, tingling or new weaknesses MSK: (+) bilateral toe pain and numbness in feet, left 4th finger skin lesion for a month, not healing Behavioral/Psych: Mood is stable, no new changes  All other systems were reviewed with the patient and are negative.  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  Pulse 93   Temp 97.6 F (36.4 C) (Oral)   Resp 17   Ht 5\' 11"  (1.803 m)   Wt 135 lb 3.2 oz (61.3 kg)   SpO2 97%   BMI 18.86 kg/m   GENERAL:alert, no distress and comfortable (+) he vomited today during the visit SKIN: skin color, texture, turgor are normal, or significant lesions EYES: normal, conjunctiva are pink and non-injected, sclera clear OROPHARYNX:no exudate, no erythema and lips, buccal mucosa, and tongue normal  NECK: supple, thyroid normal size, non-tender, without nodularity LYMPH:  no palpable lymphadenopathy in the cervical, axillary or inguinal LUNGS: clear to auscultation and percussion with normal breathing effort HEART: regular rate. (+) systolic murmur in the metrial valve area. no lower extremity edema ABDOMEN:abdomen soft, normal bowel sounds (+) Liver enlarged about 6cm below ribcage, (+) generalized abdominal tenderness (+) large amount ascitics  Musculoskeletal:no cyanosis of digits and no clubbing  PSYCH: alert & oriented x 3 with fluent speech NEURO: no focal motor/sensory deficits  LABORATORY DATA:  I have reviewed the data as listed CBC Latest Ref Rng & Units 12/26/2017  11/25/2017 10/09/2017  WBC 4.0 - 10.3 K/uL 9.3 10.0 12.1(H)  Hemoglobin 13.0 - 17.1 g/dL 15.4 13.4 13.3  Hematocrit 38.4 - 49.9 % 46.0 39.2 38.2(L)  Platelets 140 - 400 K/uL 237 298 313   CMP Latest Ref Rng & Units 01/08/2018 12/26/2017 11/25/2017  Glucose 70 - 99 mg/dL 97 124(H) 93  BUN 8 - 23 mg/dL 11 17 12   Creatinine 0.61 - 1.24 mg/dL 0.69 0.70 0.69  Sodium 135 - 145 mmol/L 134(L) 133(L) 132(L)  Potassium 3.5 - 5.1 mmol/L 4.1 4.1 4.0  Chloride 98 - 111 mmol/L 99 97(L) 97(L)  CO2 22 - 32 mmol/L 26 25 26   Calcium 8.9 - 10.3 mg/dL 8.8(L) 9.0 8.6(L)  Total Protein 6.5 - 8.1 g/dL 7.6 7.4 6.7  Total Bilirubin 0.3 - 1.2 mg/dL 0.8 1.1 0.9  Alkaline Phos 38 - 126 U/L 364(H) 367(H) 336(H)  AST 15 - 41 U/L 61(H) 119(H) 91(H)  ALT 0 - 44 U/L 30 68(H) 32   AFP (<6.1ng/ml): 06/28/2017: 5.3 04/19/2017: 8.7 10/09/2017: 12.6 12/26/17: 27.2  PROCEDURES  Upper Endoscopy by Dr. Pincus Sanes 04/29/17 IMPRESSION - Normal larynx. - Normal esophagus. - Portal hypertensive gastropathy. Biopsied. - Normal examined duodenum.   RADIOGRAPHIC STUDIES: I have personally reviewed the radiological images as listed and agreed with the findings in the report.   MRI Abdomen W Wo Contrast 10/17/2015 IMPRESSION: 1. Mild progression of multifocal hepatocellular carcinoma in the cirrhotic liver. Disease is predominantly in the right hepatic lobe and appears relatively stable although new foci of hypervascular lesions are noted medial and cranial to the segment VI  ablation defect with washout characteristics compatible with [place]. Tiny new foci of early hyperenhancement in the left liver also concerning for additional sites of new disease. 2. Cholelithiasis.    ASSESSMENT & PLAN:  62 y.o. Caucasian male with a personal history of alcohol abuse, smoking, cirrhosis of the liver, and chronic hepatitis-C.  1. Hepatocellular Carcinoma, multifocal (2) in right lobe, stage II, progressed in liver 04/2017  - Giving his  typial MRI image findings, which is consistent with hepatocellular carcinoma, and underline liver cirrhosis, his diagnosis is quite certain. This was the consensus from our GI tumor board and tissue biopsy was not felt to be necessary.  -Due to his significant liver cirrhosis and multifocal disease, surgeon Dr. Barry Dienes did not feel he is a candidate for surgical candidate  -Due to his alcohol abuse, he was not a candidate for liver transplant. However he has stopped drinking alcohol, he has been to the liver clinic to see NP [name] to discuss liver transplant.   -The patient saw Dr. Pascal Lux in March 2018 and underwent Liver embolization and ablation by Dr. Pascal Lux in 09/2016.  -MRI Abdomen from 01/10/17 showed the treated lesions were smaller.  -MRI abdomen in 04/26/17 which showed multiple new lesions in the liver, suspicious for recurrence. This was reviewed with pt and he subsequently underwent Y 90 treatment in February 2019. -I reviewed his recent restaging abdominal MRI from 10/16/2017, which unfortunately showed further disease progression in the liver.  His AFP has slightly increased also lately, consistent with disease progression. -I have started him on first line Lenvatiib -he has been tolerating Lenvatinib well overall.  -Repeat paracentesis soon at [facility] on 8/30 -F/u in 2 weeks with abdominal MRI w wo contrast. If he has disease progression, will switch him to immuotherapy   2. Multiple Lung Nodules  -His CT Chest from 05/30/17 showed multiple lung nodules that do not favor metastasis. Will monitor.    3. Alcohol and Smoking Cessation -The patient has gradually lessened the amount he drank over time. -He has completely stopped drinking alcohol in March 2018. -He has stopped smoking as of 10/29/17 -I encouraged the patient to continue abstaining from alcohol and smoking consumption.  4. Hepatitis-C, untreated -The patient has a history of chronic hepatitis-C. -he will follow up  with [name], NP, and possible receive hep C treatment after his cancer treatment.  5. 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  He is on lactulose, Lasix and Aldactone now. -follow up with Dr. Loletha Carrow and Arrie Aran  -He has required paracentesis every 10 days on average, I sent a message to [facility] to see if she would like to increase his Lasix and Aldactone -We will discuss with Dr. Pascal Lux to see if he is a candidate for TIP  6. HTN/hypotension -On Lisinopril and HCTZ -He was recently started on Lasix and Aldactone by [name] due to ascites and edema.  - I encouraged him to get BP meter to check his level at home daily. I discussed if his Systolic BP drops below 161 he should hold his BP medication, Lasix and spironolactone. -I discussed with chemotherapy his BP may increase, we will have to monitor his BP closely and change his medication as needed.   7. Hearing loss in left ear -He complains of having episodes of not being  able to hear well. It is intermitted and not there all the time -I advised him to see his PCP for this  8. Nausea and vomiting -He vomited today during the visit and complained that he's been feeling more nauseated and has also been vomiting more frequently recently. -I will give compazine today and call in a prescription into his pharmacy  PLAN -Repeat paracentesis on 8/30 at [facility] symptom relief -I refilled Lactulose today -F/u and labs in 2 weeks with abdominal MRI w wo contrast a few days before the visit -I will give compazine for nausea, and called in Compazine to his pharmacy -I sent a message to [facility] about his diuretics  All questions were answered. The patient knows to call the clinic with any problems, questions or concerns. I spent 20 minutes counseling  the patient face to face. The total time spent in the appointment was 30 minutes and more than 50% was on counseling.  Joshua [name] am acting as scribe for Dr. Truitt Merle.  I have reviewed the above documentation for accuracy and completeness, and I agree with the above.      Truitt Merle, MD 01/08/2018

## 2018-01-08 ENCOUNTER — Inpatient Hospital Stay: Payer: BLUE CROSS/BLUE SHIELD

## 2018-01-08 ENCOUNTER — Telehealth: Payer: Self-pay

## 2018-01-08 ENCOUNTER — Inpatient Hospital Stay (HOSPITAL_BASED_OUTPATIENT_CLINIC_OR_DEPARTMENT_OTHER): Payer: BLUE CROSS/BLUE SHIELD | Admitting: Hematology

## 2018-01-08 ENCOUNTER — Encounter: Payer: Self-pay | Admitting: Hematology

## 2018-01-08 VITALS — BP 130/94 | HR 93 | Temp 97.6°F | Resp 17 | Ht 71.0 in | Wt 135.2 lb

## 2018-01-08 DIAGNOSIS — K7030 Alcoholic cirrhosis of liver without ascites: Secondary | ICD-10-CM

## 2018-01-08 DIAGNOSIS — H9192 Unspecified hearing loss, left ear: Secondary | ICD-10-CM

## 2018-01-08 DIAGNOSIS — R918 Other nonspecific abnormal finding of lung field: Secondary | ICD-10-CM | POA: Diagnosis not present

## 2018-01-08 DIAGNOSIS — B182 Chronic viral hepatitis C: Secondary | ICD-10-CM

## 2018-01-08 DIAGNOSIS — K8020 Calculus of gallbladder without cholecystitis without obstruction: Secondary | ICD-10-CM

## 2018-01-08 DIAGNOSIS — R109 Unspecified abdominal pain: Secondary | ICD-10-CM

## 2018-01-08 DIAGNOSIS — R200 Anesthesia of skin: Secondary | ICD-10-CM

## 2018-01-08 DIAGNOSIS — Z79899 Other long term (current) drug therapy: Secondary | ICD-10-CM

## 2018-01-08 DIAGNOSIS — J439 Emphysema, unspecified: Secondary | ICD-10-CM

## 2018-01-08 DIAGNOSIS — K3189 Other diseases of stomach and duodenum: Secondary | ICD-10-CM

## 2018-01-08 DIAGNOSIS — M79675 Pain in left toe(s): Secondary | ICD-10-CM

## 2018-01-08 DIAGNOSIS — I10 Essential (primary) hypertension: Secondary | ICD-10-CM

## 2018-01-08 DIAGNOSIS — C220 Liver cell carcinoma: Secondary | ICD-10-CM

## 2018-01-08 DIAGNOSIS — K766 Portal hypertension: Secondary | ICD-10-CM

## 2018-01-08 DIAGNOSIS — Z87891 Personal history of nicotine dependence: Secondary | ICD-10-CM

## 2018-01-08 DIAGNOSIS — M79674 Pain in right toe(s): Secondary | ICD-10-CM

## 2018-01-08 LAB — COMPREHENSIVE METABOLIC PANEL
ALBUMIN: 2.4 g/dL — AB (ref 3.5–5.0)
ALT: 30 U/L (ref 0–44)
ANION GAP: 9 (ref 5–15)
AST: 61 U/L — ABNORMAL HIGH (ref 15–41)
Alkaline Phosphatase: 364 U/L — ABNORMAL HIGH (ref 38–126)
BUN: 11 mg/dL (ref 8–23)
CHLORIDE: 99 mmol/L (ref 98–111)
CO2: 26 mmol/L (ref 22–32)
Calcium: 8.8 mg/dL — ABNORMAL LOW (ref 8.9–10.3)
Creatinine, Ser: 0.69 mg/dL (ref 0.61–1.24)
GFR calc non Af Amer: >60 mL/min (ref >60)
Glucose, Bld: 97 mg/dL (ref 70–99)
Potassium: 4.1 mmol/L (ref 3.5–5.1)
SODIUM: 134 mmol/L — AB (ref 135–145)
Total Bilirubin: 0.8 mg/dL (ref 0.3–1.2)
Total Protein: 7.6 g/dL (ref 6.5–8.1)

## 2018-01-08 MED ORDER — PROCHLORPERAZINE MALEATE 10 MG PO TABS
10.0000 mg | ORAL_TABLET | Freq: Once | ORAL | Status: AC
  Administered 2018-01-08: 10 mg via ORAL

## 2018-01-08 MED ORDER — LACTULOSE 10 G PO PACK: 10.0000 g | PACK | Freq: Three times a day (TID) | ORAL | 1 refills | Status: DC

## 2018-01-08 MED ORDER — PROCHLORPERAZINE MALEATE 10 MG PO TABS
ORAL_TABLET | ORAL | Status: AC
  Filled 2018-01-08: qty 1

## 2018-01-08 MED ORDER — PROCHLORPERAZINE MALEATE 10 MG PO TABS: 10.0000 mg | ORAL_TABLET | Freq: Four times a day (QID) | ORAL | 0 refills | Status: DC | PRN

## 2018-01-08 NOTE — Telephone Encounter (Signed)
Printed avs and calender of upcoming appointment. per 8/28 los

## 2018-01-10 ENCOUNTER — Encounter (HOSPITAL_COMMUNITY): Payer: Self-pay | Admitting: Radiology

## 2018-01-10 ENCOUNTER — Ambulatory Visit (HOSPITAL_COMMUNITY)
Admission: RE | Admit: 2018-01-10 | Discharge: 2018-01-10 | Disposition: A | Discharge status: Home / Self Care | Payer: BLUE CROSS/BLUE SHIELD | Source: Ambulatory Visit | Attending: Hematology | Admitting: Hematology

## 2018-01-10 DIAGNOSIS — C220 Liver cell carcinoma: Secondary | ICD-10-CM | POA: Diagnosis not present

## 2018-01-10 DIAGNOSIS — B1920 Unspecified viral hepatitis C without hepatic coma: Secondary | ICD-10-CM | POA: Insufficient documentation

## 2018-01-10 DIAGNOSIS — K7031 Alcoholic cirrhosis of liver with ascites: Secondary | ICD-10-CM | POA: Insufficient documentation

## 2018-01-10 DIAGNOSIS — K7030 Alcoholic cirrhosis of liver without ascites: Secondary | ICD-10-CM

## 2018-01-10 HISTORY — PX: IR PARACENTESIS: IMG2679

## 2018-01-10 MED ORDER — LIDOCAINE HCL 1 % IJ SOLN
INTRAMUSCULAR | Status: AC
  Filled 2018-01-10: qty 20

## 2018-01-10 MED ORDER — LIDOCAINE HCL (PF) 1 % IJ SOLN
INTRAMUSCULAR | Status: AC | PRN
  Administered 2018-01-10: 10 mL

## 2018-01-10 NOTE — Procedures (Addendum)
Ultrasound-guided therapeutic paracentesis performed yielding 3.4 liters of yellow fluid. No immediate complications.  

## 2018-01-15 ENCOUNTER — Other Ambulatory Visit: Payer: Self-pay | Admitting: Internal Medicine

## 2018-01-15 NOTE — Telephone Encounter (Signed)
Copied from [place] [id number]. Topic: Quick Communication - Rx Refill/Question >> Jan 15, 2018  2:47 PM Keene Breath wrote: Medication: traZODone (DESYREL) 50 MG tablet  Patient called to request a refill for the above medication.  CB# [phone].   Preferred Pharmacy (with phone number or street name): [facility] [place], [place] AT [address] [phone] (Phone) [phone] (Fax)

## 2018-01-15 NOTE — Telephone Encounter (Signed)
Refill of desyrel  LOV 10/28/17 Dr. Elease Hashimoto  LRF 11/26/17   #60  0 refills  [id number] [place], [address] AT [address]       [phone] (Phone) [phone] (Fax)

## 2018-01-16 NOTE — Telephone Encounter (Signed)
Dr.Burchette's pt

## 2018-01-21 ENCOUNTER — Telehealth: Payer: Self-pay

## 2018-01-21 ENCOUNTER — Ambulatory Visit: Payer: BLUE CROSS/BLUE SHIELD | Admitting: Hematology

## 2018-01-21 ENCOUNTER — Other Ambulatory Visit: Payer: BLUE CROSS/BLUE SHIELD

## 2018-01-21 ENCOUNTER — Other Ambulatory Visit: Payer: Self-pay

## 2018-01-21 ENCOUNTER — Ambulatory Visit (HOSPITAL_COMMUNITY)
Admission: RE | Admit: 2018-01-21 | Discharge: 2018-01-21 | Disposition: A | Discharge status: Home / Self Care | Payer: BLUE CROSS/BLUE SHIELD | Source: Ambulatory Visit | Attending: Hematology | Admitting: Hematology

## 2018-01-21 DIAGNOSIS — K8020 Calculus of gallbladder without cholecystitis without obstruction: Secondary | ICD-10-CM | POA: Diagnosis not present

## 2018-01-21 DIAGNOSIS — R188 Other ascites: Secondary | ICD-10-CM | POA: Insufficient documentation

## 2018-01-21 DIAGNOSIS — C220 Liver cell carcinoma: Secondary | ICD-10-CM | POA: Insufficient documentation

## 2018-01-21 MED ORDER — TRAZODONE HCL 50 MG PO TABS: 100.0000 mg | ORAL_TABLET | Freq: Every day | ORAL | 0 refills | Status: AC

## 2018-01-21 MED ORDER — GADOBUTROL 1 MMOL/ML IV SOLN
6.0000 mL | Freq: Once | INTRAVENOUS | Status: AC | PRN
  Administered 2018-01-21: 6 mL via INTRAVENOUS

## 2018-01-21 NOTE — Telephone Encounter (Signed)
Informed patient Dr. Burr Medico will refill trazodone this time until can get in with PCP, patient verbalized an understanding.  Was sent into [place], [place].

## 2018-01-21 NOTE — Telephone Encounter (Signed)
Patient calls states that his PCP retired and he has been turned over to a new physician who is requiring he come in for an appointment for a physical prior to refilling his medications.  He is out of Trazodone which helps him sleep.  Informed patient I would speak to Dr. Burr Medico to see if she is willing to refill once until he can get in for an appointment.

## 2018-01-22 NOTE — Progress Notes (Signed)
[name]  [phone] [phone]  Clinic Follow Up Note   Patient Care Team: Marletta Lor, MD as PCP - General Danis, Kirke Corin, MD as Consulting Physician (Gastroenterology) Sandi Mariscal, MD as Consulting Physician (Interventional Radiology) Truitt Merle, MD as Consulting Physician (Hematology)   Date of Service:  01/24/2018   CHIEF COMPLAINTS:  Follow up hepatocellular carcinoma  Oncology History   Cancer Staging Hepatocellular carcinoma [facility]) Staging form: Liver, AJCC 8th Edition - Clinical stage from 08/01/2016: Stage II (cT2(m), cN0, cM0) - Signed by Truitt Merle, MD on 08/24/2016       Hepatocellular carcinoma ([place])   06/28/2016 Tumor Marker    AFP 5.3    07/12/2016 Imaging    US Abdomen 07/12/16 IMPRESSION: 1. There is shadowing gallstone within gallbladder measures 1.6 cm. No sonographic Murphy's sign. No thickening of gallbladder wall. Normal CBD. 2. Again noted heterogeneous increased echogenicity of the liver with nodular contour consistent with cirrhosis. There is hypoechoic lesion in right hepatic lobe measures 1.7 x 2.4 cm. There is a second hypoechoic lesion in inferior aspect of the right hepatic lobe measures 4.6 x 3.6 cm. This is ill defined. Further evaluation with enhanced MRI is recommended to exclude evolving hepatic masses. 3. No hydronephrosis or renal calculi. 4. No aortic aneurysm.    07/16/2016 Imaging    MRI Abdomen w wo Contrast 07/16/16 IMPRESSION: 1. Two enhancing lesions in the RIGHT hepatic lobe on the background of hepatitis-C and liver cirrhosis are consistent with hepatocellular carcinoma. Recommend multidisciplinary GI, surgical and oncological consultation. 2. Morphologic changes of cirrhosis. 3. No ascites.  Patent portal veins.    07/16/2016 Initial Diagnosis    Hepatocellular carcinoma ([place])    09/13/2016 Procedure    Bland liver embolization before microwave ablation, Dr. Pascal Lux     10/05/2016 Surgery    LIVER MICROWAVE THERMAL ABLATION by Dr. Pascal Lux  10/05/16    01/10/2017 Imaging    MRI Abdomen W WO Contrast 01/10/17 IMPRESSION: 1. Expected postprocedural changes from ablation of 2 hepatic masses. No complicating features are demonstrated. No findings suspicious for residual or recurrent tumor. 2. 10 mm early arterial phase enhancing nodule in the right hepatic lobe posteriorly could be a dysplastic nodule or early Burton. Recommend continued surveillance. 3. Severe cirrhotic changes involving the liver with areas of confluent hepatic fibrosis and markedly enlarged caudate lobe. Stable portal venous collaterals, portal venous hypertension and esophageal varices. No splenomegaly or ascites. 4. Stable cholelithiasis.     04/26/2017 Imaging    MRI Abdomen W Wo Contrast 04/26/17 IMPRESSION: 1. Although the ablation zones along the dome of liver in segment 6 of the right lobe demonstrate interval decrease in size from previous exam there are multiple new arterial phase enhancing lesions involving both lobes of liver worrisome for recurrent multicentric HCC. 2.  Aortic Atherosclerosis (ICD10-I70.0).    04/29/2017 Procedure    Upper Endoscopy by Dr. Pincus Sanes 04/29/17 IMPRESSION - Normal larynx. - Normal esophagus. - Portal hypertensive gastropathy. Biopsied. - Normal examined duodenum.    05/30/2017 Imaging    CT Chest 05/30/17 IMPRESSION: 1. Slow enlargement of RIGHT upper lobe ground-glass nodule. Recommend follow-up CT without contrast 12 months. 2. Mild increase in prominence of 4 mm solid and semi-solid nodule in the RIGHT lower lobe. Recommend follow-up in 12 months as above. Pulmonary nodules not favored metastatic but primary bronchogenic adenocarcinoma cannot be excluded. Recommendations for the Management of Subsolid Pulmonary Nodules Detected at CT: A Statement from the Fleischner  Society Radiology 2013; 266:1, [id number]. 3. Nodular cirrhotic liver  treatment sites noted Aortic Atherosclerosis (ICD10-I70.0).    06/2017 Procedure    He underwent Y90 treatment on 06/27/17 and 07/09/17 by Dr. Pascal Lux    10/16/2017 Imaging    MRI Abdomen 10/16/17 IMPRESSION: 1. Mild progression of multifocal hepatocellular carcinoma in the cirrhotic liver. Disease is predominantly in the right hepatic lobe and appears relatively stable although new foci of hypervascular lesions are noted medial and cranial to the segment VI ablation defect with washout characteristics compatible with HCC. Tiny new foci of early hyperenhancement in the left liver also concerning for additional sites of new disease. 2. Cholelithiasis.    10/29/2017 -  Chemotherapy    Lenvatinib 4mg  week one, 8mg  week 2 and 3 and 12mg  week 4    11/12/2017 Imaging    IMPRESSION: 1. Stable CT chest exam. No substantial change in the right lung nodules. Continued attention on follow-up suggested. 2. Cirrhotic morphology of the liver with interval increase in associated ascites. 3.  Aortic Atherosclerois (ICD10-170.0) 4.  Emphysema. ([id number])    01/10/2018 Procedure    01/10/2018 Paracentesis  IMPRESSION: Successful ultrasound-guided therapeutic paracentesis yielding 3.4 liters of peritoneal fluid.    01/21/2018 Imaging    01/21/2018 MRI Abdomen IMPRESSION: 1. Direct comparison to the prior exam is limited secondary to lack of well timed arterial phase imaging today. By comparing portal venous phase images of the 2 exams, the tumor burden is primarily felt to be decreased. There is likely a new or progressive lesion within the high anterior right liver lobe, as detailed above. 2. No findings of metastatic disease within the abdomen. 3. Moderate volume ascites. 4. Cholelithiasis.     HISTORY OF PRESENTING ILLNESS (08/24/2016):  [name] 62 y.o. male is here because of a new diagnosis of hepatocellular carcinoma.  The patient had an US of the abdomen on 07/12/16 for a personal history of  chronic hepatitis-C and alcoholism complicated by cirrhosis. This a showed a gallstone in the gallbladder measuring 1.6 cm, heterogeneous increased echogenicity of the liver with nodular contour consistent with cirrhosis, a hypoechoic lesion in right hepatic lobe measuring 1.7 x 2.4 cm, and a second ill-defined hypoechoic lesion in inferior aspect of the right hepatic lobe measuring  4.6 x 3.6 cm.  MRI of the abdomen on 07/16/16 showed two enhancing lesions in the right hepatic lobe (1.7 cm and 1.9 cm)  in the background of hepatitis-C and liver cirrhosis consistent with hepatocellular carcinoma. These findings are associated with a mildly elevated AFT level of 5.3 obtained on 06/28/16.  The patient was referred to Dr. Pascal Lux of IR on 08/21/16 to discuss treatment options. He explained that the goal standard is surgical resection and he may benefit from input from Dr. Barry Dienes regarding operative candidacy. If he is not an operative candidate,conversations were held with the patient regarding potential percutaneous treatment options; such as microwave ablation and transcatheter embolization. Dr. Pascal Lux recommended cirrhotic protocol CTA of the abd/pelvis to better delinate the patient's hepatic arterial supply and determine the true size of the ill-defined lesion in the caudal aspect of the right hepatic lobe. He will return afterwards to Dr. Pascal Lux to further discuss his treatment plan. The patient's case was also discussed with [name], NP who wished the patient to undergo definitive hepatocellular carcinoma treatment prior to the hepatitis-C treatment.  The patient presents today with his mother and Dr. Elta Guadeloupe, his step-father, to discuss possible systemic treatments for the management of his  disease. He denies nausea, bloating, changes in bowel habits, or abdominal pain. He used Trazodone to sleep. He denies hematemesis or hematochezia. The patient states his hep-C was an incidental finding. He reports easily  bruising.   CURRENT THERAPY: Lenvatinib 12mg  daily    INTERVAL HISTORY  DESIDERIO DOLATA is here for a follow up. He had a successful ultrasound-guided therapeutic paracentesis yielding 3.4 liters of peritoneal fluid on 01/10/2018.  Today, he is here with his wife today. He said he feels the same as last visit. He will see Dawn on 02/24/2018. He denies being bloated. He said that he felt yesterday while sitting and standing. He is not dizzy now and his appetite is fair. He tries to eat even though he is losing weight. He Is taking Lenvima 3 times daily.  He says that his left 5th toenail is painful with pus coming out for a week now. He covers it with a bandage.   He states that he has developed a coccyx painful ulcer after losing weight. He also experiences leg cramps.   MEDICAL HISTORY:  Past Medical History:  Diagnosis Date  . Cancer ([place])   . Cataract   . Chronic alcoholism ([place])   . Cirrhosis ([place])   . Depression   . Glucose intolerance (impaired glucose tolerance)   . Hepatitis C   . Hypertension   . Tobacco use   . Umbilical hernia     SURGICAL HISTORY: Past Surgical History:  Procedure Laterality Date  . CATARACT EXTRACTION    . COLONOSCOPY  12/09  . INGUINAL HERNIA REPAIR    . IR 3D INDEPENDENT WKST  09/13/2016  . IR [place] ADDITIONAL VESSEL  09/13/2016  . IR [place] ADDITIONAL VESSEL  09/13/2016  . IR [place] ADDITIONAL VESSEL  09/13/2016  . IR [place] ADDITIONAL VESSEL  09/13/2016  . IR [place] ADDITIONAL VESSEL  09/13/2016  . IR ANGIOGRAM SELECTIVE EACH ADDITIONAL VESSEL  06/27/2017  . IR ANGIOGRAM SELECTIVE EACH ADDITIONAL VESSEL  06/27/2017  . IR ANGIOGRAM SELECTIVE EACH ADDITIONAL VESSEL  06/27/2017  . IR ANGIOGRAM SELECTIVE EACH ADDITIONAL VESSEL  07/09/2017  . IR ANGIOGRAM SELECTIVE EACH ADDITIONAL VESSEL  07/09/2017  . IR ANGIOGRAM VISCERAL SELECTIVE  09/13/2016  . IR ANGIOGRAM VISCERAL  SELECTIVE  06/27/2017  . IR ANGIOGRAM VISCERAL SELECTIVE  06/27/2017  . IR ANGIOGRAM VISCERAL SELECTIVE  07/09/2017  . IR EMBO TUMOR ORGAN ISCHEMIA INFARCT INC GUIDE ROADMAPPING  09/13/2016  . IR EMBO TUMOR ORGAN ISCHEMIA INFARCT INC GUIDE ROADMAPPING  07/09/2017  . IR PARACENTESIS  10/10/2017  . IR PARACENTESIS  10/28/2017  . IR PARACENTESIS  11/11/2017  . IR PARACENTESIS  11/21/2017  . IR PARACENTESIS  12/20/2017  . IR PARACENTESIS  01/10/2018  . IR RADIOLOGIST EVAL & MGMT  08/21/2016  . IR RADIOLOGIST EVAL & MGMT  08/30/2016  . IR RADIOLOGIST EVAL & MGMT  09/26/2016  . IR RADIOLOGIST EVAL & MGMT  01/10/2017  . IR RADIOLOGIST EVAL & MGMT  11/06/2016  . IR RADIOLOGIST EVAL & MGMT  05/16/2017  . IR RADIOLOGIST EVAL & MGMT  06/13/2017  . IR RADIOLOGIST EVAL & MGMT  08/01/2017  . IR RADIOLOGIST EVAL & MGMT  10/16/2017  . IR US GUIDE VASC ACCESS RIGHT  09/13/2016  . IR US GUIDE VASC ACCESS RIGHT  06/27/2017  . IR US GUIDE VASC ACCESS RIGHT  07/09/2017  . left orchiectomy  07/2009  . RADIOFREQUENCY ABLATION N/A 10/05/2016  Procedure: LIVER MICROWAVE THERMAL ABLATION;  Surgeon: Sandi Mariscal, MD;  Location: [facility];  Service: Anesthesiology;  Laterality: N/A;  . UMBILICAL HERNIA REPAIR      SOCIAL HISTORY: Social History   Socioeconomic History  . Marital status: Single    Spouse name: Not on file  . Number of children: Not on file  . Years of education: Not on file  . Highest education level: Not on file  Occupational History  . Not on file  Social Needs  . Financial resource strain: Not on file  . Food insecurity:    Worry: Not on file    Inability: Not on file  . Transportation needs:    Medical: Not on file    Non-medical: Not on file  Tobacco Use  . Smoking status: Former Smoker    Packs/day: 0.50    Years: 42.00    Pack years: 21.00    Types: Cigarettes    Start date: 05/21/1971    Last attempt to quit: 07/28/2017    Years since quitting: 0.4  . Smokeless tobacco: Former Systems developer  . Tobacco  comment: Has cut down to about 1/3 pack per day  Substance and Sexual Activity  . Alcohol use: No    Alcohol/week: 28.0 standard drinks    Types: 28 Cans of beer per week    Comment: hx of nothing current   . Drug use: No  . Sexual activity: Not on file  Lifestyle  . Physical activity:    Days per week: Not on file    Minutes per session: Not on file  . Stress: Not on file  Relationships  . Social connections:    Talks on phone: Not on file    Gets together: Not on file    Attends religious service: Not on file    Active member of club or organization: Not on file    Attends meetings of clubs or organizations: Not on file    Relationship status: Not on file  . Intimate partner violence:    Fear of current or ex partner: Not on file    Emotionally abused: Not on file    Physically abused: Not on file    Forced sexual activity: Not on file  Other Topics Concern  . Not on file  Social History Narrative  . Not on file   He works as a Hydrologist.  FAMILY HISTORY: Family History  Problem Relation Age of Onset  . Healthy Mother   . Colon cancer Neg Hx   . Stomach cancer Neg Hx   . Esophageal cancer Neg Hx   . Rectal cancer Neg Hx     ALLERGIES:  has No Known Allergies.  MEDICATIONS:  Current Outpatient Medications  Medication Sig Dispense Refill  . acetaminophen (TYLENOL) 325 MG tablet Take 650 mg by mouth every 6 (six) hours as needed (for headaches.).    Marland Kitchen b complex vitamins capsule Take 1 capsule by mouth daily.    . furosemide (LASIX) 40 MG tablet Take 40 mg by mouth 2 (two) times daily.     Marland Kitchen lactulose (CEPHULAC) 10 g packet Take 1 packet (10 g total) by mouth 3 (three) times daily. 90 each 1  . Lenvatinib 12 mg daily dose (LENVIMA) 3 x 4 MG capsule Take 12 mg by mouth daily. 90 capsule 2  . lisinopril-hydrochlorothiazide (PRINZIDE,ZESTORETIC) 10-12.5 MG tablet Take 1 tablet by mouth daily. 90 tablet 0  . Multiple Vitamin (MULTIVITAMIN WITH MINERALS) TABS  tablet  Take 1 tablet by mouth daily.    . prochlorperazine (COMPAZINE) 10 MG tablet Take 1 tablet (10 mg total) by mouth every 6 (six) hours as needed for nausea or vomiting. 30 tablet 0  . spironolactone (ALDACTONE) 100 MG tablet Take 100 mg by mouth 2 (two) times daily.     . traZODone (DESYREL) 50 MG tablet Take 2 tablets (100 mg total) by mouth at bedtime. 60 tablet 0  . triamcinolone ointment (KENALOG) 0.5 % Apply 1 application topically 2 (two) times daily. 30 g 0   Current Facility-Administered Medications  Medication Dose Route Frequency Provider Last Rate Last Dose  . 0.9 %  sodium chloride infusion  500 mL Intravenous Continuous Danis, Estill Cotta III, MD        REVIEW OF SYSTEMS:   Constitutional: Denies fevers, chills or abnormal night sweats  Eyes: Denies blurriness of vision, double vision or watery eyes Ears, nose, mouth, throat, and face: Denies mucositis or sore throat  Respiratory: Denies cough, dyspnea or wheezes Cardiovascular: Denies palpitation, chest discomfort or LL edema Gastrointestinal:  Denies nausea, heartburn. No new changes in BM Skin: No new skin rash or discoloration.  Lymphatics: Denies new lymphadenopathy or easy bruising Neurological:Denies numbness, tingling or new weaknesses (+) dizziness, standing and sitting MSK: (+) bilateral toe pain and numbness in feet, left 5th toe pain and pus (+) coccyx pain and ulcer (+) leg cramps Behavioral/Psych: Mood is stable, no new changes  All other systems were reviewed with the patient and are negative.  PHYSICAL EXAMINATION:  ECOG PERFORMANCE STATUS: 2 BP 139/89 (BP Location: Left Arm, Patient Position: Sitting)   Pulse 69   Temp 97.6 F (36.4 C) (Oral)   Resp 18   Ht 5\' 11"  (1.803 m)   Wt 128 lb 11.2 oz (58.4 kg)   SpO2 100%   BMI 17.95 kg/m   GENERAL:alert, no distress and comfortable (+) wheelchair SKIN: skin color, texture, turgor are normal, or significant lesions except the skin ulcers at coccyx and  left 4th toe, (+) 1cm skin ulcer at lateral left 4th toe, and two (+) coccyx 1x2cm shallow ulcers which are dry, no discharge or skin erythema EYES: normal, conjunctiva are pink and non-injected, sclera clear OROPHARYNX:no exudate, no erythema and lips, buccal mucosa, and tongue normal  NECK: supple, thyroid normal size, non-tender, without nodularity LYMPH:  no palpable lymphadenopathy in the cervical, axillary or inguinal LUNGS: clear to auscultation and percussion with normal breathing effort HEART: regular rate. (+) systolic murmur in the metrial valve area. no lower extremity edema ABDOMEN:abdomen soft, normal bowel sounds (+) Liver enlarged about 5 cm below ribcage (+) mild to moderate ascitics  Musculoskeletal:no cyanosis of digits and no clubbing PSYCH: alert & oriented x 3 with fluent speech NEURO: no focal motor/sensory deficits  LABORATORY DATA:  I have reviewed the data as listed CBC Latest Ref Rng & Units 01/24/2018 12/26/2017 11/25/2017  WBC 4.0 - 10.3 K/uL 9.1 9.3 10.0  Hemoglobin 13.0 - 17.1 g/dL 14.0 15.4 13.4  Hematocrit 38.4 - 49.9 % 41.6 46.0 39.2  Platelets 140 - 400 K/uL 230 237 298   CMP Latest Ref Rng & Units 01/24/2018 01/08/2018 12/26/2017  Glucose 70 - 99 mg/dL 91 97 124(H)  BUN 8 - 23 mg/dL 12 11 17   Creatinine 0.61 - 1.24 mg/dL 0.68 0.69 0.70  Sodium 135 - 145 mmol/L 130(L) 134(L) 133(L)  Potassium 3.5 - 5.1 mmol/L 4.4 4.1 4.1  Chloride 98 - 111 mmol/L 92(L) 99 97(L)  CO2 22 - 32 mmol/L 28 26 25   Calcium 8.9 - 10.3 mg/dL 8.8(L) 8.8(L) 9.0  Total Protein 6.5 - 8.1 g/dL 6.9 7.6 7.4  Total Bilirubin 0.3 - 1.2 mg/dL 1.0 0.8 1.1  Alkaline Phos 38 - 126 U/L 317(H) 364(H) 367(H)  AST 15 - 41 U/L 61(H) 61(H) 119(H)  ALT 0 - 44 U/L 30 30 68(H)   AFP (<6.1ng/ml): 06/28/2017: 5.3 04/19/2017: 8.7 10/09/2017: 12.6 12/26/17: 27.2  PROCEDURES  01/10/2018 Paracentesis  IMPRESSION: Successful ultrasound-guided therapeutic paracentesis yielding 3.4 liters of peritoneal  fluid.  Upper Endoscopy by Dr. Pincus Sanes 04/29/17 IMPRESSION - Normal larynx. - Normal esophagus. - Portal hypertensive gastropathy. Biopsied. - Normal examined duodenum.   RADIOGRAPHIC STUDIES: I have personally reviewed the radiological images as listed and agreed with the findings in the report.  01/21/2018 MRI Abdomen IMPRESSION: 1. Direct comparison to the prior exam is limited secondary to lack of well timed arterial phase imaging today. By comparing portal venous phase images of the 2 exams, the tumor burden is primarily felt to be decreased. There is likely a new or progressive lesion within the high anterior right liver lobe, as detailed above. 2. No findings of metastatic disease within the abdomen. 3. Moderate volume ascites. 4. Cholelithiasis.  MRI Abdomen W Wo Contrast 10/17/2015 IMPRESSION: 1. Mild progression of multifocal hepatocellular carcinoma in the cirrhotic liver. Disease is predominantly in the right hepatic lobe and appears relatively stable although new foci of hypervascular lesions are noted medial and cranial to the segment VI ablation defect with washout characteristics compatible with HCC. Tiny new foci of early hyperenhancement in the left liver also concerning for additional sites of new disease. 2. Cholelithiasis.    ASSESSMENT & PLAN:   62 y.o. Caucasian male with a personal history of alcohol abuse, smoking, cirrhosis of the liver, and chronic hepatitis-C.  1. Hepatocellular Carcinoma, multifocal (2) in right lobe, stage II, progressed in liver 04/2017  - Giving his typial MRI image findings, which is consistent with hepatocellular carcinoma, and underline liver cirrhosis, his diagnosis is quite certain. This was the consensus from our GI tumor board and tissue biopsy was not felt to be necessary.  -Due to his significant liver cirrhosis and multifocal disease, surgeon Dr. Barry Dienes did not feel he is a candidate for surgical candidate  -Due to his  alcohol abuse, he was not a candidate for liver transplant. However he has stopped drinking alcohol, he has been to the liver clinic to see NP [name] to discuss liver transplant.   -The patient saw Dr. Pascal Lux in March 2018 and underwent Liver embolization and ablation by Dr. Pascal Lux in 09/2016.  -MRI Abdomen from 01/10/17 showed the treated lesions were smaller.  -MRI abdomen in 04/26/17 which showed multiple new lesions in the liver, suspicious for recurrence. This was reviewed with pt and he subsequently underwent Y 90 treatment in February 2019. -I reviewed his recent restaging abdominal MRI from 10/16/2017, which unfortunately showed further disease progression in the liver.  His AFP has slightly increased also lately, consistent with disease progression. -I have started him on first line Lenvatinib -he has been tolerating Lenvatinib well overall.  -He had a successful ultrasound-guided therapeutic paracentesis yielding 3.4 liters of peritoneal fluid on 01/10/2018.  He was requiring paracentesis every 10 days, he has not needed since last procedure 2 weeks ago.  It seemed to be slowing down now. -MRI on 01/21/2018 imaging was reviewed by myself, and I agree with the radiology interpretation.  I discussed  the findings with patient and his mother, and the venous phase, overall stable lesions has decreased, except a 1 lesion.  He is clinically stable, ascites has slightly decreased lately, tolerating lenvatinib well, will continue, with close follow-up abdominal MRI in 2 to 3 months. -F/u in 2-3 weeks.  -Dietician referral   2. Multiple Lung Nodules  -His CT Chest from 05/30/17 showed multiple lung nodules that do not favor metastasis. Will monitor.   3. Alcohol and Smoking Cessation -The patient has gradually lessened the amount he drank over time. -He has completely stopped drinking alcohol in March 2018. -He has stopped smoking as of 10/29/17 -I encouraged the patient to continue abstaining from  alcohol and smoking consumption.  4. Hepatitis-C, untreated -The patient has a history of chronic hepatitis-C. -he will follow up with [name], NP, and possible receive hep C treatment after his cancer treatment.  5. 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  He is on lactulose, Lasix and Aldactone now. -follow up with Dr. Loletha Carrow and Arrie Aran  -He has required paracentesis every 10 days on average, I sent a message to [facility] to see if she would like to increase his Lasix and Aldactone, he has an appointment to see down in a months. -We have discussed with Dr. Pascal Lux and he feels he will not benefit from TIP  6. HTN/hypotension -On Lisinopril and HCTZ -He was recently started on Lasix and Aldactone by [name] due to ascites and edema.  - I encouraged him to get BP meter to check his level at home daily. I discussed if his Systolic BP drops below 165 he should hold his BP medication, Lasix and spironolactone. -I discussed with chemotherapy his BP may increase, we will have to monitor his BP closely and change his medication as needed.   7. Hearing loss in left ear -He complains of having episodes of not being able to hear well. It is intermitted and not there all the time -I advised him to see his PCP for this  8. Nausea and vomiting -He has mild intermittent nausea no vomiting, probably related to liver cancer and decompensated liver cirrhosis. -Continue Compazine as needed, improved overall.  9.Leg Cramps -I advised him to recude salt intake and avoid canned food, and drink water adequately -I encouraged him to renew multivitamins, which contains calcium, low-dose potassium and magnesium.  PLAN -Restaging MI discussed, overall improved.   -We will continue Lenvima, refilled today  -f/u in  2-3 weeks with lab  -Repeat scan in 2-3 months   -Dietician referral   All questions were answered. The patient knows to call the clinic with any problems, questions or concerns. I spent 25 minutes counseling the patient face to face. The total time spent in the appointment was 30 minutes and more than 50% was on counseling.  Dierdre Searles Dweik am acting as scribe for Dr. Truitt Merle.  I have reviewed the above documentation for accuracy and completeness, and I agree with the above.      Truitt Merle, MD 01/24/2018

## 2018-01-24 ENCOUNTER — Inpatient Hospital Stay: Payer: BLUE CROSS/BLUE SHIELD | Attending: Hematology | Admitting: Hematology

## 2018-01-24 ENCOUNTER — Encounter: Payer: Self-pay | Admitting: Hematology

## 2018-01-24 ENCOUNTER — Inpatient Hospital Stay: Payer: BLUE CROSS/BLUE SHIELD

## 2018-01-24 ENCOUNTER — Telehealth: Payer: Self-pay

## 2018-01-24 VITALS — BP 139/89 | HR 69 | Temp 97.6°F | Resp 18 | Ht 71.0 in | Wt 128.7 lb

## 2018-01-24 DIAGNOSIS — R252 Cramp and spasm: Secondary | ICD-10-CM | POA: Insufficient documentation

## 2018-01-24 DIAGNOSIS — B182 Chronic viral hepatitis C: Secondary | ICD-10-CM | POA: Insufficient documentation

## 2018-01-24 DIAGNOSIS — K7031 Alcoholic cirrhosis of liver with ascites: Secondary | ICD-10-CM | POA: Diagnosis not present

## 2018-01-24 DIAGNOSIS — C220 Liver cell carcinoma: Secondary | ICD-10-CM | POA: Diagnosis not present

## 2018-01-24 DIAGNOSIS — I10 Essential (primary) hypertension: Secondary | ICD-10-CM | POA: Insufficient documentation

## 2018-01-24 DIAGNOSIS — R110 Nausea: Secondary | ICD-10-CM | POA: Insufficient documentation

## 2018-01-24 DIAGNOSIS — Z87891 Personal history of nicotine dependence: Secondary | ICD-10-CM | POA: Diagnosis not present

## 2018-01-24 DIAGNOSIS — Z9221 Personal history of antineoplastic chemotherapy: Secondary | ICD-10-CM | POA: Insufficient documentation

## 2018-01-24 DIAGNOSIS — J439 Emphysema, unspecified: Secondary | ICD-10-CM | POA: Diagnosis not present

## 2018-01-24 DIAGNOSIS — L89159 Pressure ulcer of sacral region, unspecified stage: Secondary | ICD-10-CM | POA: Diagnosis not present

## 2018-01-24 DIAGNOSIS — I700 Atherosclerosis of aorta: Secondary | ICD-10-CM | POA: Insufficient documentation

## 2018-01-24 DIAGNOSIS — Z79899 Other long term (current) drug therapy: Secondary | ICD-10-CM | POA: Diagnosis not present

## 2018-01-24 LAB — COMPREHENSIVE METABOLIC PANEL
ALBUMIN: 2.3 g/dL — AB (ref 3.5–5.0)
ALK PHOS: 317 U/L — AB (ref 38–126)
ALT: 30 U/L (ref 0–44)
ANION GAP: 10 (ref 5–15)
AST: 61 U/L — ABNORMAL HIGH (ref 15–41)
BILIRUBIN TOTAL: 1 mg/dL (ref 0.3–1.2)
BUN: 12 mg/dL (ref 8–23)
CALCIUM: 8.8 mg/dL — AB (ref 8.9–10.3)
CO2: 28 mmol/L (ref 22–32)
Chloride: 92 mmol/L — ABNORMAL LOW (ref 98–111)
Creatinine, Ser: 0.68 mg/dL (ref 0.61–1.24)
GFR calc Af Amer: >60 mL/min (ref >60)
Glucose, Bld: 91 mg/dL (ref 70–99)
POTASSIUM: 4.4 mmol/L (ref 3.5–5.1)
Sodium: 130 mmol/L — ABNORMAL LOW (ref 135–145)
TOTAL PROTEIN: 6.9 g/dL (ref 6.5–8.1)

## 2018-01-24 LAB — CBC WITH DIFFERENTIAL/PLATELET
BASOS ABS: 0 10*3/uL (ref 0.0–0.1)
BASOS PCT: 0 %
Eosinophils Absolute: 0.1 10*3/uL (ref 0.0–0.5)
Eosinophils Relative: 1 %
HEMATOCRIT: 41.6 % (ref 38.4–49.9)
Hemoglobin: 14 g/dL (ref 13.0–17.1)
LYMPHS PCT: 20 %
Lymphs Abs: 1.8 10*3/uL (ref 0.9–3.3)
MCH: 31.3 pg (ref 27.2–33.4)
MCHC: 33.7 g/dL (ref 32.0–36.0)
MCV: 93.1 fL (ref 79.3–98.0)
MONO ABS: 1 10*3/uL — AB (ref 0.1–0.9)
Monocytes Relative: 11 %
NEUTROS ABS: 6.2 10*3/uL (ref 1.5–6.5)
NEUTROS PCT: 68 %
Platelets: 230 10*3/uL (ref 140–400)
RBC: 4.47 MIL/uL (ref 4.20–5.82)
RDW: 16.7 % — AB (ref 11.0–14.6)
WBC: 9.1 10*3/uL (ref 4.0–10.3)

## 2018-01-24 LAB — MAGNESIUM: Magnesium: 1.8 mg/dL (ref 1.7–2.4)

## 2018-01-24 MED ORDER — LENVATINIB (12 MG DAILY DOSE) 3 X 4 MG PO CPPK: 12.0000 mg | ORAL_CAPSULE | Freq: Every day | ORAL | 2 refills | Status: DC

## 2018-01-24 NOTE — Telephone Encounter (Signed)
Printed avs and calender of upcoming appointment. per 9/13 los 

## 2018-01-25 LAB — AFP TUMOR MARKER: AFP, Serum, Tumor Marker: 20.8 ng/mL — ABNORMAL HIGH (ref 0.0–8.3)

## 2018-01-27 ENCOUNTER — Telehealth: Payer: Self-pay

## 2018-01-27 ENCOUNTER — Telehealth: Payer: Self-pay | Admitting: *Deleted

## 2018-01-27 ENCOUNTER — Encounter (HOSPITAL_COMMUNITY): Payer: Self-pay | Admitting: Physician Assistant

## 2018-01-27 ENCOUNTER — Ambulatory Visit (HOSPITAL_COMMUNITY)
Admission: RE | Admit: 2018-01-27 | Discharge: 2018-01-27 | Disposition: A | Discharge status: Home / Self Care | Payer: BLUE CROSS/BLUE SHIELD | Source: Ambulatory Visit | Attending: Nurse Practitioner | Admitting: Nurse Practitioner

## 2018-01-27 ENCOUNTER — Other Ambulatory Visit: Payer: Self-pay | Admitting: Nurse Practitioner

## 2018-01-27 DIAGNOSIS — K7460 Unspecified cirrhosis of liver: Secondary | ICD-10-CM | POA: Diagnosis not present

## 2018-01-27 DIAGNOSIS — R188 Other ascites: Secondary | ICD-10-CM

## 2018-01-27 DIAGNOSIS — C220 Liver cell carcinoma: Secondary | ICD-10-CM | POA: Diagnosis not present

## 2018-01-27 DIAGNOSIS — B1920 Unspecified viral hepatitis C without hepatic coma: Secondary | ICD-10-CM | POA: Insufficient documentation

## 2018-01-27 HISTORY — PX: IR PARACENTESIS: IMG2679

## 2018-01-27 MED ORDER — LIDOCAINE HCL (PF) 2 % IJ SOLN
INTRAMUSCULAR | Status: DC | PRN
  Administered 2018-01-27: 10 mL

## 2018-01-27 MED ORDER — LIDOCAINE HCL (PF) 2 % IJ SOLN
INTRAMUSCULAR | Status: AC
  Filled 2018-01-27: qty 20

## 2018-01-27 NOTE — Procedures (Signed)
PROCEDURE SUMMARY:  Successful image-guided paracentesis from the right upper lateral abdomen.  Yielded 2.4 liters of hazy yellow fluid.  No immediate complications.  Patient tolerated well.   Specimen was sent for labs (cytology only).  Joaquim Nam PA-C 01/27/2018 3:42 PM

## 2018-01-27 NOTE — Telephone Encounter (Signed)
"  I'm trying to reach dr. Ernestina Penna nurse.  I look pregnant and I'm a man.  I need to have fluid drained. I've had this four to five times so I know when I need to have this done.  No shortness of breath.  I don't eat well but no nausea or vomiting, no swelling, heartburn, indigestion or fever.  I feel pressure that starts to hurt my liver is when I know I need fluid drained."

## 2018-01-27 NOTE — Telephone Encounter (Signed)
8:34 am  Received vm message from patient requesting a paracentesis appt. As soon as possible.

## 2018-01-27 NOTE — Telephone Encounter (Signed)
Followed up with patient regarding message that he "needs a paracentesis". Patient stated that he "blew up like a balloon" over the weekend. He stated that he is SOB and cannot eat due to the pressure and Dr. Burr Medico told him to call when he feels like this. Spoke with Regan Rakers NP and US paracentesis was ordered and scheduled for this Wed at 10:00 am with 9:45 arrival time at [facility] and patient made aware and had no other questions.

## 2018-01-27 NOTE — Telephone Encounter (Signed)
"  I'm trying to reach Dr. Ernestina Penna nurse.  I need permission to have paracentesis done.  I'm not short of breath or anything; they do not have me down for one."

## 2018-01-27 NOTE — Telephone Encounter (Signed)
I placed orders and my nurse is working on getting this scheduled asap.  Thanks, Jeralyn Nolden NP

## 2018-01-28 ENCOUNTER — Inpatient Hospital Stay: Payer: BLUE CROSS/BLUE SHIELD | Admitting: Nutrition

## 2018-01-28 ENCOUNTER — Ambulatory Visit (HOSPITAL_COMMUNITY): Payer: BLUE CROSS/BLUE SHIELD

## 2018-01-28 NOTE — Progress Notes (Signed)
62 year old male diagnosed with [facility] is a patient of Dr. Burr Medico.  Past medical history includes tobacco, hypertension, hepatitis C, glucose intolerance, depression, cirrhosis and alcoholism.  Medications include B complex vitamin, Lasix, multivitamin, and Compazine.  Labs include sodium 130 and albumin 2.3 on September 13.  Height: 5 feet 11 inches. Weight: 128.7 pounds on September 13. Usual body weight: 155 pounds in March. BMI: 17.95. (Could be skewed with fluid shifts)  Patient reports he has an occasional poor appetite. He denies nausea and vomiting. He reports he is feeling constipated but is taking MiraLAX. He tries to follow a low-sodium diet secondary to ascites. Patient cooks for himself however, his mother does grocery shopping for him.  Nutrition diagnosis:  Underweight related to inadequate oral intake and poor appetite as evidenced by approximate 30 pound weight loss over 6 months.  Intervention: Educated patient to follow a low-sodium, high-protein diet. Reviewed importance of choosing small amounts of food more often. Reviewed high-calorie high-protein foods. Educated patient on strategies to improve constipation. Provided fact sheets.  Questions were answered.  Teach back method used. Patient was provided with samples of oral nutrition supplements along with coupons.  Monitoring, evaluation, goals: Patient will tolerate increased calories and protein to minimize loss of lean body mass.  Next visit: Patient will contact me with questions, concerns or to schedule follow-up appointment.  **Disclaimer: This note was dictated with voice recognition software. Similar sounding words can inadvertently be transcribed and this note may contain transcription errors which may not have been corrected upon publication of note.**

## 2018-01-29 ENCOUNTER — Ambulatory Visit (HOSPITAL_COMMUNITY): Payer: BLUE CROSS/BLUE SHIELD

## 2018-02-05 ENCOUNTER — Other Ambulatory Visit: Payer: Self-pay

## 2018-02-05 ENCOUNTER — Encounter: Payer: Self-pay | Admitting: Family Medicine

## 2018-02-05 ENCOUNTER — Ambulatory Visit: Payer: BLUE CROSS/BLUE SHIELD | Admitting: Family Medicine

## 2018-02-05 VITALS — BP 110/66 | HR 100 | Temp 97.4°F | Ht 71.0 in | Wt 129.9 lb

## 2018-02-05 DIAGNOSIS — I10 Essential (primary) hypertension: Secondary | ICD-10-CM

## 2018-02-05 DIAGNOSIS — J449 Chronic obstructive pulmonary disease, unspecified: Secondary | ICD-10-CM | POA: Diagnosis not present

## 2018-02-05 DIAGNOSIS — C220 Liver cell carcinoma: Secondary | ICD-10-CM

## 2018-02-05 DIAGNOSIS — B182 Chronic viral hepatitis C: Secondary | ICD-10-CM | POA: Diagnosis not present

## 2018-02-05 DIAGNOSIS — E440 Moderate protein-calorie malnutrition: Secondary | ICD-10-CM

## 2018-02-05 DIAGNOSIS — E46 Unspecified protein-calorie malnutrition: Secondary | ICD-10-CM | POA: Insufficient documentation

## 2018-02-05 DIAGNOSIS — L89302 Pressure ulcer of unspecified buttock, stage 2: Secondary | ICD-10-CM

## 2018-02-05 NOTE — Progress Notes (Signed)
Subjective:     Patient ID: Joshua [name], male   DOB: 1956-02-13, 62 y.o.   MRN: [id number]  HPI Patient seen with chief complaint of bilateral leg swelling and "getting weaker".  He has multiple chronic problems including history of COPD, alcoholic cirrhosis, history of chronic hepatitis C, hepatocellular carcinoma, hypertension.  He is followed closely by oncology and hepatocellular carcinoma is being treated by them.  He also has been followed apparently hepatitis C clinic and was recently started on spironolactone 100 mg twice daily.  He also takes Lasix 40 mg twice daily.  He has had some ascites in the past has had previous paracentesis.  Denies recent fever or chills.  He had a recent comprehensive metabolic panel, magnesium, CBC through hematology and these results were reviewed.  Medications reviewed.  He takes lisinopril HCTZ for hypertension.  Occasional lightheadedness.  No recent syncope.  Poor appetite.  Had some gradual weight loss.  Had normal TSH back in August.  Has developed a couple of ulcers on his right and left buttock.  Very poor nutritional status.  Recent albumin 2.3.  Very little activity.  No smoking no alcohol in over a year  Past Medical History:  Diagnosis Date  . Cancer ([place])   . Cataract   . Chronic alcoholism ([place])   . Cirrhosis ([place])   . Depression   . Glucose intolerance (impaired glucose tolerance)   . Hepatitis C   . Hypertension   . Tobacco use   . Umbilical hernia    Past Surgical History:  Procedure Laterality Date  . CATARACT EXTRACTION    . COLONOSCOPY  12/09  . INGUINAL HERNIA REPAIR    . IR 3D INDEPENDENT WKST  09/13/2016  . IR [place] ADDITIONAL VESSEL  09/13/2016  . IR [place] ADDITIONAL VESSEL  09/13/2016  . IR [place] ADDITIONAL VESSEL  09/13/2016  . IR [place] ADDITIONAL VESSEL  09/13/2016  . IR [place] ADDITIONAL VESSEL  09/13/2016  . IR ANGIOGRAM SELECTIVE  EACH ADDITIONAL VESSEL  06/27/2017  . IR ANGIOGRAM SELECTIVE EACH ADDITIONAL VESSEL  06/27/2017  . IR ANGIOGRAM SELECTIVE EACH ADDITIONAL VESSEL  06/27/2017  . IR ANGIOGRAM SELECTIVE EACH ADDITIONAL VESSEL  07/09/2017  . IR ANGIOGRAM SELECTIVE EACH ADDITIONAL VESSEL  07/09/2017  . IR ANGIOGRAM VISCERAL SELECTIVE  09/13/2016  . IR ANGIOGRAM VISCERAL SELECTIVE  06/27/2017  . IR ANGIOGRAM VISCERAL SELECTIVE  06/27/2017  . IR ANGIOGRAM VISCERAL SELECTIVE  07/09/2017  . IR EMBO TUMOR ORGAN ISCHEMIA INFARCT INC GUIDE ROADMAPPING  09/13/2016  . IR EMBO TUMOR ORGAN ISCHEMIA INFARCT INC GUIDE ROADMAPPING  07/09/2017  . IR PARACENTESIS  10/10/2017  . IR PARACENTESIS  10/28/2017  . IR PARACENTESIS  11/11/2017  . IR PARACENTESIS  11/21/2017  . IR PARACENTESIS  12/20/2017  . IR PARACENTESIS  01/10/2018  . IR PARACENTESIS  01/27/2018  . IR RADIOLOGIST EVAL & MGMT  08/21/2016  . IR RADIOLOGIST EVAL & MGMT  08/30/2016  . IR RADIOLOGIST EVAL & MGMT  09/26/2016  . IR RADIOLOGIST EVAL & MGMT  01/10/2017  . IR RADIOLOGIST EVAL & MGMT  11/06/2016  . IR RADIOLOGIST EVAL & MGMT  05/16/2017  . IR RADIOLOGIST EVAL & MGMT  06/13/2017  . IR RADIOLOGIST EVAL & MGMT  08/01/2017  . IR RADIOLOGIST EVAL & MGMT  10/16/2017  . IR US GUIDE VASC ACCESS RIGHT  09/13/2016  . IR US GUIDE VASC ACCESS RIGHT  06/27/2017  .  IR US GUIDE VASC ACCESS RIGHT  07/09/2017  . left orchiectomy  07/2009  . RADIOFREQUENCY ABLATION N/A 10/05/2016   Procedure: LIVER MICROWAVE THERMAL ABLATION;  Surgeon: Sandi Mariscal, MD;  Location: [facility];  Service: Anesthesiology;  Laterality: N/A;  . UMBILICAL HERNIA REPAIR      reports that he quit smoking about 6 months ago. His smoking use included cigarettes. He started smoking about 46 years ago. He has a 21.00 pack-year smoking history. He has quit using smokeless tobacco. He reports that he does not drink alcohol or use drugs. family history includes Healthy in his mother. No Known Allergies   Review of Systems  Constitutional:  Positive for appetite change and fatigue. Negative for chills and fever.  HENT: Negative for trouble swallowing.   Respiratory: Negative for cough.   Cardiovascular: Positive for leg swelling. Negative for chest pain and palpitations.  Gastrointestinal: Negative for blood in stool, constipation, nausea and vomiting.  Genitourinary: Negative for dysuria.  Neurological: Negative for syncope and headaches.  Psychiatric/Behavioral: Negative for confusion.       Objective:   Physical Exam  Constitutional: He is oriented to person, place, and time.  Thin and somewhat cachectic appearing gentleman in no distress  Cardiovascular: Regular rhythm.  Pulmonary/Chest:  Somewhat diminished breath sounds throughout.  No wheezes or rales.  Abdominal: Soft. There is no tenderness.  Musculoskeletal: He exhibits edema.  No pitting edema in the legs and only trace pitting edema ankles and feet bilaterally  Neurological: He is alert and oriented to person, place, and time.  Skin:  Patient has 2 stage II decubitus ulcers including one which is 2.5 x 1 cm right buttock and 1.5 x 1 cm left buttock.  No cellulitis changes  Psychiatric: He has a normal mood and affect.       Assessment:     #1 hepatocellular carcinoma-being treated by oncology  #2 chronic hepatitis C  #3 alcoholic cirrhosis  #4 history of hypertension currently stable  #5 stage II decubitus ulcers right and left buttock  #6 history of COPD    Plan:     -Monitor blood pressure and hold lisinopril HCTZ for systolic less than 979 -We recommend try to shift positions and keep pressure off ulcers as much as possible.  We applied Tegaderm to decubitus ulcers and reassess next week -Address flu vaccine at follow-up next week  [name] MD [facility] at [facility]

## 2018-02-05 NOTE — Patient Instructions (Signed)
Pressure Injury A pressure injury, sometimes called a bedsore, is an injury to the skin and underlying tissue caused by pressure. Pressure on blood vessels causes decreased blood flow to the skin, which can eventually cause the skin tissue to die and break down into a wound. Pressure injuries usually occur:  Over bony parts of the body such as the tailbone, shoulders, elbows, hips, and heels.  Under medical devices such as respiratory equipment, stockings, tubes, and splints.  Pressure injuries start as reddened areas on the skin and can lead to pain, muscle damage, and infection. Pressure injuries can vary in severity. What are the causes? Pressure injuries are caused by a lack of blood supply to an area of skin. They can occur from intense pressure over a short period of time or from less intense pressure over a long period of time. What increases the risk? This condition is more likely to develop in people who:  Are in the hospital or an extended care facility.  Are bedridden or in a wheelchair.  Have an injury or disease that keeps them from: ? Moving normally. ? Feeling pain or pressure.  Have a condition that: ? Makes them sleepy or less alert. ? Causes poor blood flow.  Need to wear a medical device.  Have poor control of their bladder or bowel functions (incontinence).  Have poor nutrition (malnutrition).  Are of certain ethnicities. People of African American and Latino or Hispanic descent are at higher risk compared to other ethnic groups.  If you are at risk for pressure ulcers, your health care provider may recommend certain types of bedding to help prevent them. These may include foam or gel mattresses covered with one of the following:  A sheepskin blanket.  A pad that is filled with gel, air, water, or foam.  What are the signs or symptoms? The main symptom is a blister or change in skin color that opens into a wound. Other symptoms include:  Red or dark  areas of skin that do not turn white or pale when pressed with a finger.  Pain, warmth, or change of skin texture.  How is this diagnosed? This condition is diagnosed with a medical history and physical exam. You may also have tests, including:  Blood tests to check for infection or signs of poor nutrition.  Imaging studies to check for damage to the deep tissues under your skin.  Blood flow studies.  Your pressure injury will be staged to determine its severity. Staging is an assessment of:  The depth of the pressure injury.  Which tissues are exposed because of the pressure injury.  The causes of the pressure injury.  How is this treated? The main focus of treatment is to help your injury heal. This may be done by:  Relieving or redistributing pressure on your skin. This includes: ? Frequently changing your position. ? Eliminating or minimizing positions that caused the wound or that can make the wound worse. ? Using specific bed mattresses and chair cushions. ? Refitting, resizing, or replacing any medical devices, or padding the skin under them. ? Using creams or powders to prevent rubbing (friction) on the skin.  Keeping your skin clean and dry. This may include using a skin cleanser or skin protectant as told by your health care provider. This may be a lotion, ointment, or spray.  Cleaning your injury and removing any dead tissue from the wound (debridement).  Placing a bandage (dressing) over your injury.  Preventing or treating infection.  This may include antibiotic, antimicrobial, or antiseptic medicines.  Treatment may also include medicine for pain. Sometimes surgery is needed to close the wound with a flap of healthy skin or a piece of skin from another area of your body (graft). You may need surgery if other treatments are not working or if your injury is very deep. Follow these instructions at home: Wound care  Follow instructions from your health care  provider about: ? How to take care of your wound. ? When and how you should change your dressing. ? When you should remove your dressing. If your dressing is dry and stuck when you try to remove it, moisten or wet the dressing with saline or water so that it can be removed without harming your skin or wound tissue.  Check your wound every day for signs of infection. Have a caregiver do this for you if you are not able. Watch for: ? More redness, swelling, or pain. ? More fluid, blood, or pus. ? A bad smell. Skin Care  Keep your skin clean and dry. Gently pat your skin dry.  Do not rub or massage your skin.  Use a skin protectant only as told by your health care provider.  Check your skin every day for any changes in color or any new blisters or sores (ulcers). Have a caregiver do this for you if you are not able. Medicines  Take over-the-counter and prescription medicines only as told by your health care provider.  If you were prescribed an antibiotic medicine, take it or apply it as told by your health care provider. Do not stop taking or using the antibiotic even if your condition improves. Reducing and Redistributing Pressure  Do not lie or sit in one position for a long time. Move or change position every two hours or as told by your health care provider.  Use pillows or cushions to reduce pressure. Ask your health care provider to recommend cushions or pads for you.  Use medical devices that do not rub your skin. Tell your health care provider if one of your medical devices is causing a pressure injury to develop. General instructions   Eat a healthy diet that includes lots of protein. Ask your health care provider for diet advice.  Drink enough fluid to keep your urine clear or pale yellow.  Be as active as you can every day. Ask your health care provider to suggest safe exercises or activities.  Do not abuse drugs or alcohol.  Keep all follow-up visits as told by your  health care provider. This is important.  Do not smoke. Contact a health care provider if:   You have chills or fever.  Your pain medicine is not helping.  You have any changes in skin color.  You have new blisters or sores.  You develop warmth, redness, or swelling near a pressure injury.  You have a bad odor or pus coming from your pressure injury.  You lose control of your bowels or bladder.  You develop new symptoms.  Your wound does not improve after 1-2 weeks of treatment.  You develop a new medical condition, such as diabetes, peripheral vascular disease, or conditions that affect your defense (immune) system. This information is not intended to replace advice given to you by your health care provider. Make sure you discuss any questions you have with your health care provider. Document Released: 04/30/2005 Document Revised: 10/03/2015 Document Reviewed: 09/08/2014 Elsevier Interactive Patient Education  2018 [employer]vier Inc[employer]  

## 2018-02-10 ENCOUNTER — Other Ambulatory Visit: Payer: Self-pay

## 2018-02-10 ENCOUNTER — Encounter: Payer: Self-pay | Admitting: Family Medicine

## 2018-02-10 ENCOUNTER — Ambulatory Visit: Payer: BLUE CROSS/BLUE SHIELD | Admitting: Family Medicine

## 2018-02-10 VITALS — BP 98/60 | HR 108 | Temp 97.6°F | Ht 71.0 in | Wt 128.7 lb

## 2018-02-10 DIAGNOSIS — S40212A Abrasion of left shoulder, initial encounter: Secondary | ICD-10-CM

## 2018-02-10 DIAGNOSIS — L97522 Non-pressure chronic ulcer of other part of left foot with fat layer exposed: Secondary | ICD-10-CM | POA: Diagnosis not present

## 2018-02-10 DIAGNOSIS — K7030 Alcoholic cirrhosis of liver without ascites: Secondary | ICD-10-CM | POA: Diagnosis not present

## 2018-02-10 DIAGNOSIS — S70212A Abrasion, left hip, initial encounter: Secondary | ICD-10-CM

## 2018-02-10 DIAGNOSIS — C220 Liver cell carcinoma: Secondary | ICD-10-CM

## 2018-02-10 DIAGNOSIS — S20312A Abrasion of left front wall of thorax, initial encounter: Secondary | ICD-10-CM

## 2018-02-10 DIAGNOSIS — L89302 Pressure ulcer of unspecified buttock, stage 2: Secondary | ICD-10-CM | POA: Diagnosis not present

## 2018-02-10 DIAGNOSIS — Z23 Encounter for immunization: Secondary | ICD-10-CM

## 2018-02-10 DIAGNOSIS — E440 Moderate protein-calorie malnutrition: Secondary | ICD-10-CM | POA: Diagnosis not present

## 2018-02-10 MED ORDER — CEPHALEXIN 500 MG PO CAPS: 500.0000 mg | ORAL_CAPSULE | Freq: Four times a day (QID) | ORAL | 0 refills | Status: DC

## 2018-02-10 NOTE — Progress Notes (Signed)
Subjective:     Patient ID: Joshua [name], male   DOB: 07-Oct-1955, 62 y.o.   MRN: [id number]  HPI Patient is here with multiple issues as follows  Was just seen and noted to have stage II ulcers right and left buttock.  We tried to apply Tegaderm but this came off after couple days.  Patient has very high risk for poor healing of ulcers with poor nutritional status and minimally ambulatory and sits in one position most of the day. He has history of alcoholic cirrhosis and hepatocellular carcinoma as well as protein calorie malnutrition which will all contribute to poor wound healing.  He had a fall Saturday.  He states he got up and felt lightheaded and fell backwards into the door and fell into a closet and landed on some various objects.  He had multiple injuries including abrasion left lateral hip, left anterior chest, left shoulder.  He had some low back pain which is somewhat better today.  Denied any loss of consciousness.  Denies headache.  Denies any dizziness at this time.  His blood pressure has been running on the low side recently.  In addition to buttock ulcers above he apparently had some ulcers left third and fourth toes for the past several weeks.  He states this is where his toes rub together.  Again minimally ambulatory.  No history of diabetes.  Ex-smoker.  Past Medical History:  Diagnosis Date  . Cancer ([place])   . Cataract   . Chronic alcoholism ([place])   . Cirrhosis ([place])   . Depression   . Glucose intolerance (impaired glucose tolerance)   . Hepatitis C   . Hypertension   . Tobacco use   . Umbilical hernia    Past Surgical History:  Procedure Laterality Date  . CATARACT EXTRACTION    . COLONOSCOPY  12/09  . INGUINAL HERNIA REPAIR    . IR 3D INDEPENDENT WKST  09/13/2016  . IR [place] ADDITIONAL VESSEL  09/13/2016  . IR [place] ADDITIONAL VESSEL  09/13/2016  . IR [place] ADDITIONAL VESSEL  09/13/2016  . IR [place] ADDITIONAL VESSEL  09/13/2016  . IR [place] ADDITIONAL VESSEL  09/13/2016  . IR ANGIOGRAM SELECTIVE EACH ADDITIONAL VESSEL  06/27/2017  . IR ANGIOGRAM SELECTIVE EACH ADDITIONAL VESSEL  06/27/2017  . IR ANGIOGRAM SELECTIVE EACH ADDITIONAL VESSEL  06/27/2017  . IR ANGIOGRAM SELECTIVE EACH ADDITIONAL VESSEL  07/09/2017  . IR ANGIOGRAM SELECTIVE EACH ADDITIONAL VESSEL  07/09/2017  . IR ANGIOGRAM VISCERAL SELECTIVE  09/13/2016  . IR ANGIOGRAM VISCERAL SELECTIVE  06/27/2017  . IR ANGIOGRAM VISCERAL SELECTIVE  06/27/2017  . IR ANGIOGRAM VISCERAL SELECTIVE  07/09/2017  . IR EMBO TUMOR ORGAN ISCHEMIA INFARCT INC GUIDE ROADMAPPING  09/13/2016  . IR EMBO TUMOR ORGAN ISCHEMIA INFARCT INC GUIDE ROADMAPPING  07/09/2017  . IR PARACENTESIS  10/10/2017  . IR PARACENTESIS  10/28/2017  . IR PARACENTESIS  11/11/2017  . IR PARACENTESIS  11/21/2017  . IR PARACENTESIS  12/20/2017  . IR PARACENTESIS  01/10/2018  . IR PARACENTESIS  01/27/2018  . IR RADIOLOGIST EVAL & MGMT  08/21/2016  . IR RADIOLOGIST EVAL & MGMT  08/30/2016  . IR RADIOLOGIST EVAL & MGMT  09/26/2016  . IR RADIOLOGIST EVAL & MGMT  01/10/2017  . IR RADIOLOGIST EVAL & MGMT  11/06/2016  . IR RADIOLOGIST EVAL & MGMT  05/16/2017  . IR RADIOLOGIST EVAL & MGMT  06/13/2017  . IR RADIOLOGIST  EVAL & MGMT  08/01/2017  . IR RADIOLOGIST EVAL & MGMT  10/16/2017  . IR US GUIDE VASC ACCESS RIGHT  09/13/2016  . IR US GUIDE VASC ACCESS RIGHT  06/27/2017  . IR US GUIDE VASC ACCESS RIGHT  07/09/2017  . left orchiectomy  07/2009  . RADIOFREQUENCY ABLATION N/A 10/05/2016   Procedure: LIVER MICROWAVE THERMAL ABLATION;  Surgeon: Sandi Mariscal, MD;  Location: [facility];  Service: Anesthesiology;  Laterality: N/A;  . UMBILICAL HERNIA REPAIR      reports that he quit smoking about 6 months ago. His smoking use included cigarettes. He started smoking about 46 years ago. He has a 21.00 pack-year smoking history. He has quit using smokeless tobacco. He reports that he does not drink  alcohol or use drugs. family history includes Healthy in his mother. No Known Allergies   Review of Systems  Constitutional: Positive for fatigue. Negative for chills and fever.  Respiratory: Negative for cough.   Cardiovascular: Positive for leg swelling. Negative for chest pain.  Gastrointestinal: Negative for abdominal pain and vomiting.  Genitourinary: Negative for dysuria.  Neurological: Positive for weakness and light-headedness. Negative for syncope.  Psychiatric/Behavioral: Negative for confusion.       Objective:   Physical Exam  Constitutional:  Alert frail cachectic appearing 62 year old male  Cardiovascular: Regular rhythm.  Pulmonary/Chest:  Somewhat diminished breath sounds throughout.  Pulse oximetry 98%.  No retractions.  Abdominal: Soft. There is no tenderness.  Musculoskeletal:  Patient has some edema mostly involving the feet and lower legs bilaterally.  Neurological: He is alert.  Skin:  He has grade 2 decubitus ulcers right and left buttock which are essentially unchanged from last visit.  No surrounding erythema.  He has ulcers involving the left third and fourth toes along the lateral aspect of the toe.  Fourth toe reveals some mild erythema and mild warmth.  No purulent drainage.       Assessment:     #1 multiple abrasions from recent fall including left lateral hip, left anterior chest wall, and left lateral shoulder-none with secondary infection  #2 stage II ulcers right and left buttock-unchanged from last visit  #3 ulcerations of the left third and fourth toes.  He has some redness and probable early cellulitis changes left fourth toe  #4 protein calorie malnutrition    Plan:     -Keflex 500 mg 4 times daily for 10 days -Set up home health nursing referral for wound care management -Flu vaccine given -Discussed with his mom that he may very well need palliative care in the near future if he continues to decline.  [name]  MD [facility] at [facility]

## 2018-02-10 NOTE — Progress Notes (Signed)
[place]  [phone] [phone]  Clinic Follow Up Note   Patient Care Team: [name], MD as PCP - General (Family Medicine) Loletha Carrow, Kirke Corin, MD as Consulting Physician (Gastroenterology) Sandi Mariscal, MD as Consulting Physician (Interventional Radiology) Truitt Merle, MD as Consulting Physician (Hematology)   Date of Service:  02/13/2018   CHIEF COMPLAINTS:  Follow up hepatocellular carcinoma  Oncology History   Cancer Staging Hepatocellular carcinoma [facility]) Staging form: Liver, AJCC 8th Edition - Clinical stage from 08/01/2016: Stage II (cT2(m), cN0, cM0) - Signed by Truitt Merle, MD on 08/24/2016       Hepatocellular carcinoma ([place])   06/28/2016 Tumor Marker    AFP 5.3    07/12/2016 Imaging    US Abdomen 07/12/16 IMPRESSION: 1. There is shadowing gallstone within gallbladder measures 1.6 cm. No sonographic Murphy's sign. No thickening of gallbladder wall. Normal CBD. 2. Again noted heterogeneous increased echogenicity of the liver with nodular contour consistent with cirrhosis. There is hypoechoic lesion in right hepatic lobe measures 1.7 x 2.4 cm. There is a second hypoechoic lesion in inferior aspect of the right hepatic lobe measures 4.6 x 3.6 cm. This is ill defined. Further evaluation with enhanced MRI is recommended to exclude evolving hepatic masses. 3. No hydronephrosis or renal calculi. 4. No aortic aneurysm.    07/16/2016 Imaging    MRI Abdomen w wo Contrast 07/16/16 IMPRESSION: 1. Two enhancing lesions in the RIGHT hepatic lobe on the background of hepatitis-C and liver cirrhosis are consistent with hepatocellular carcinoma. Recommend multidisciplinary GI, surgical and oncological consultation. 2. Morphologic changes of cirrhosis. 3. No ascites.  Patent portal veins.    07/16/2016 Initial Diagnosis    Hepatocellular carcinoma ([place])    09/13/2016 Procedure    Bland liver embolization before microwave ablation, Dr.  Pascal Lux     10/05/2016 Surgery    LIVER MICROWAVE THERMAL ABLATION by Dr. Pascal Lux  10/05/16    01/10/2017 Imaging    MRI Abdomen W WO Contrast 01/10/17 IMPRESSION: 1. Expected postprocedural changes from ablation of 2 hepatic masses. No complicating features are demonstrated. No findings suspicious for residual or recurrent tumor. 2. 10 mm early arterial phase enhancing nodule in the right hepatic lobe posteriorly could be a dysplastic nodule or early [place]. Recommend continued surveillance. 3. Severe cirrhotic changes involving the liver with areas of confluent hepatic fibrosis and markedly enlarged caudate lobe. Stable portal venous collaterals, portal venous hypertension and esophageal varices. No splenomegaly or ascites. 4. Stable cholelithiasis.     04/26/2017 Imaging    MRI Abdomen W Wo Contrast 04/26/17 IMPRESSION: 1. Although the ablation zones along the dome of liver in segment 6 of the right lobe demonstrate interval decrease in size from previous exam there are multiple new arterial phase enhancing lesions involving both lobes of liver worrisome for recurrent multicentric HCC. 2.  Aortic Atherosclerosis (ICD10-I70.0).    04/29/2017 Procedure    Upper Endoscopy by Dr. Pincus Sanes 04/29/17 IMPRESSION - Normal larynx. - Normal esophagus. - Portal hypertensive gastropathy. Biopsied. - Normal examined duodenum.    05/30/2017 Imaging    CT Chest 05/30/17 IMPRESSION: 1. Slow enlargement of RIGHT upper lobe ground-glass nodule. Recommend follow-up CT without contrast 12 months. 2. Mild increase in prominence of 4 mm solid and semi-solid nodule in the RIGHT lower lobe. Recommend follow-up in 12 months as above. Pulmonary nodules not favored metastatic but primary bronchogenic adenocarcinoma cannot be excluded. Recommendations for the Management of Subsolid Pulmonary Nodules Detected at CT: A Statement  from the [facility] 2013; 266:1, [phone]. 3. Nodular  cirrhotic liver treatment sites noted Aortic Atherosclerosis (ICD10-I70.0).    06/2017 Procedure    He underwent Y90 treatment on 06/27/17 and 07/09/17 by Dr. Pascal Lux    10/16/2017 Imaging    MRI Abdomen 10/16/17 IMPRESSION: 1. Mild progression of multifocal hepatocellular carcinoma in the cirrhotic liver. Disease is predominantly in the right hepatic lobe and appears relatively stable although new foci of hypervascular lesions are noted medial and cranial to the segment VI ablation defect with washout characteristics compatible with HCC. Tiny new foci of early hyperenhancement in the left liver also concerning for additional sites of new disease. 2. Cholelithiasis.    10/29/2017 -  Chemotherapy    Lenvatinib 4mg  week one, 8mg  week 2 and 3 and 12mg  week 4    11/12/2017 Imaging    IMPRESSION: 1. Stable CT chest exam. No substantial change in the right lung nodules. Continued attention on follow-up suggested. 2. Cirrhotic morphology of the liver with interval increase in associated ascites. 3.  Aortic Atherosclerois (ICD10-170.0) 4.  Emphysema. ([id number]-J88.9)    01/10/2018 Procedure    01/10/2018 Paracentesis  IMPRESSION: Successful ultrasound-guided therapeutic paracentesis yielding 3.4 liters of peritoneal fluid.    01/21/2018 Imaging    01/21/2018 MRI Abdomen IMPRESSION: 1. Direct comparison to the prior exam is limited secondary to lack of well timed arterial phase imaging today. By comparing portal venous phase images of the 2 exams, the tumor burden is primarily felt to be decreased. There is likely a new or progressive lesion within the high anterior right liver lobe, as detailed above. 2. No findings of metastatic disease within the abdomen. 3. Moderate volume ascites. 4. Cholelithiasis.    01/27/2018 Procedure    01/27/2018 IR Paracentesis IMPRESSION: Successful ultrasound-guided paracentesis yielding 2.4 liters of peritoneal fluid. Pathology revealed NO MALIGNANT CELLS  IDENTIFIED.     HISTORY OF PRESENTING ILLNESS (08/24/2016):  Joshua [name] 62 y.o. male is here because of a new diagnosis of hepatocellular carcinoma.  The patient had an US of the abdomen on 07/12/16 for a personal history of chronic hepatitis-C and alcoholism complicated by cirrhosis. This a showed a gallstone in the gallbladder measuring 1.6 cm, heterogeneous increased echogenicity of the liver with nodular contour consistent with cirrhosis, a hypoechoic lesion in right hepatic lobe measuring 1.7 x 2.4 cm, and a second ill-defined hypoechoic lesion in inferior aspect of the right hepatic lobe measuring  4.6 x 3.6 cm.  MRI of the abdomen on 07/16/16 showed two enhancing lesions in the right hepatic lobe (1.7 cm and 1.9 cm)  in the background of hepatitis-C and liver cirrhosis consistent with hepatocellular carcinoma. These findings are associated with a mildly elevated AFT level of 5.3 obtained on 06/28/16.  The patient was referred to Dr. Pascal Lux of IR on 08/21/16 to discuss treatment options. He explained that the goal standard is surgical resection and he may benefit from input from Dr. Barry Dienes regarding operative candidacy. If he is not an operative candidate,conversations were held with the patient regarding potential percutaneous treatment options; such as microwave ablation and transcatheter embolization. Dr. Pascal Lux recommended cirrhotic protocol CTA of the abd/pelvis to better delinate the patient's hepatic arterial supply and determine the true size of the ill-defined lesion in the caudal aspect of the right hepatic lobe. He will return afterwards to Dr. Pascal Lux to further discuss his treatment plan. The patient's case was also discussed with [name], NP who wished the patient to undergo definitive hepatocellular  carcinoma treatment prior to the hepatitis-C treatment.  The patient presents today with his mother and Dr. Elta Guadeloupe, his step-father, to discuss possible systemic treatments for the management  of his disease. He denies nausea, bloating, changes in bowel habits, or abdominal pain. He used Trazodone to sleep. He denies hematemesis or hematochezia. The patient states his hep-C was an incidental finding. He reports easily bruising.   CURRENT THERAPY: Lenvatinib 12mg  daily started on 10/29/2017. Start Keytruda next week.   INTERVAL HISTORY  Joshua [name] is here for a follow up. He had a successful ultrasound-guided paracentesis yielding 2.4 liters of peritoneal fluid. Pathology revealed no malignant cells. Today, he is here with his family member. He states that he fell this Saurday in his bedroom and sustained minor bruising. He was trying to go to the restroom, when he felt dizzy and lost his balance. His friend had to help pick him up. He denies losing consciousness.  He still has left toe pain and muscle cramps.     MEDICAL HISTORY:  Past Medical History:  Diagnosis Date  . Cancer ([place])   . Cataract   . Chronic alcoholism ([place])   . Cirrhosis ([place])   . Depression   . Glucose intolerance (impaired glucose tolerance)   . Hepatitis C   . Hypertension   . Tobacco use   . Umbilical hernia     SURGICAL HISTORY: Past Surgical History:  Procedure Laterality Date  . CATARACT EXTRACTION    . COLONOSCOPY  12/09  . INGUINAL HERNIA REPAIR    . IR 3D INDEPENDENT WKST  09/13/2016  . IR [place] ADDITIONAL VESSEL  09/13/2016  . IR [place] ADDITIONAL VESSEL  09/13/2016  . IR [place] ADDITIONAL VESSEL  09/13/2016  . IR [place] ADDITIONAL VESSEL  09/13/2016  . IR [place] ADDITIONAL VESSEL  09/13/2016  . IR ANGIOGRAM SELECTIVE EACH ADDITIONAL VESSEL  06/27/2017  . IR ANGIOGRAM SELECTIVE EACH ADDITIONAL VESSEL  06/27/2017  . IR ANGIOGRAM SELECTIVE EACH ADDITIONAL VESSEL  06/27/2017  . IR ANGIOGRAM SELECTIVE EACH ADDITIONAL VESSEL  07/09/2017  . IR ANGIOGRAM SELECTIVE EACH ADDITIONAL VESSEL  07/09/2017  . IR ANGIOGRAM  VISCERAL SELECTIVE  09/13/2016  . IR ANGIOGRAM VISCERAL SELECTIVE  06/27/2017  . IR ANGIOGRAM VISCERAL SELECTIVE  06/27/2017  . IR ANGIOGRAM VISCERAL SELECTIVE  07/09/2017  . IR EMBO TUMOR ORGAN ISCHEMIA INFARCT INC GUIDE ROADMAPPING  09/13/2016  . IR EMBO TUMOR ORGAN ISCHEMIA INFARCT INC GUIDE ROADMAPPING  07/09/2017  . IR PARACENTESIS  10/10/2017  . IR PARACENTESIS  10/28/2017  . IR PARACENTESIS  11/11/2017  . IR PARACENTESIS  11/21/2017  . IR PARACENTESIS  12/20/2017  . IR PARACENTESIS  01/10/2018  . IR PARACENTESIS  01/27/2018  . IR RADIOLOGIST EVAL & MGMT  08/21/2016  . IR RADIOLOGIST EVAL & MGMT  08/30/2016  . IR RADIOLOGIST EVAL & MGMT  09/26/2016  . IR RADIOLOGIST EVAL & MGMT  01/10/2017  . IR RADIOLOGIST EVAL & MGMT  11/06/2016  . IR RADIOLOGIST EVAL & MGMT  05/16/2017  . IR RADIOLOGIST EVAL & MGMT  06/13/2017  . IR RADIOLOGIST EVAL & MGMT  08/01/2017  . IR RADIOLOGIST EVAL & MGMT  10/16/2017  . IR US GUIDE VASC ACCESS RIGHT  09/13/2016  . IR US GUIDE VASC ACCESS RIGHT  06/27/2017  . IR US GUIDE VASC ACCESS RIGHT  07/09/2017  . left orchiectomy  07/2009  . RADIOFREQUENCY ABLATION N/A 10/05/2016   Procedure:  LIVER MICROWAVE THERMAL ABLATION;  Surgeon: Sandi Mariscal, MD;  Location: [facility];  Service: Anesthesiology;  Laterality: N/A;  . UMBILICAL HERNIA REPAIR      SOCIAL HISTORY: Social History   Socioeconomic History  . Marital status: Single    Spouse name: Not on file  . Number of children: Not on file  . Years of education: Not on file  . Highest education level: Not on file  Occupational History  . Not on file  Social Needs  . Financial resource strain: Not on file  . Food insecurity:    Worry: Not on file    Inability: Not on file  . Transportation needs:    Medical: Not on file    Non-medical: Not on file  Tobacco Use  . Smoking status: Former Smoker    Packs/day: 0.50    Years: 42.00    Pack years: 21.00    Types: Cigarettes    Start date: 05/21/1971    Last attempt to quit:  07/28/2017    Years since quitting: 0.5  . Smokeless tobacco: Former Systems developer  . Tobacco comment: Has cut down to about 1/3 pack per day  Substance and Sexual Activity  . Alcohol use: No    Alcohol/week: 28.0 standard drinks    Types: 28 Cans of beer per week    Comment: hx of nothing current   . Drug use: No  . Sexual activity: Not on file  Lifestyle  . Physical activity:    Days per week: Not on file    Minutes per session: Not on file  . Stress: Not on file  Relationships  . Social connections:    Talks on phone: Not on file    Gets together: Not on file    Attends religious service: Not on file    Active member of club or organization: Not on file    Attends meetings of clubs or organizations: Not on file    Relationship status: Not on file  . Intimate partner violence:    Fear of current or ex partner: Not on file    Emotionally abused: Not on file    Physically abused: Not on file    Forced sexual activity: Not on file  Other Topics Concern  . Not on file  Social History Narrative  . Not on file   He works as a Hydrologist.  FAMILY HISTORY: Family History  Problem Relation Age of Onset  . Healthy Mother   . Colon cancer Neg Hx   . Stomach cancer Neg Hx   . Esophageal cancer Neg Hx   . Rectal cancer Neg Hx     ALLERGIES:  has No Known Allergies.  MEDICATIONS:  Current Outpatient Medications  Medication Sig Dispense Refill  . acetaminophen (TYLENOL) 325 MG tablet Take 650 mg by mouth every 6 (six) hours as needed (for headaches.).    Marland Kitchen b complex vitamins capsule Take 1 capsule by mouth daily.    . cephALEXin (KEFLEX) 500 MG capsule Take 1 capsule (500 mg total) by mouth 4 (four) times daily. 40 capsule 0  . furosemide (LASIX) 40 MG tablet Take 40 mg by mouth 2 (two) times daily.     Marland Kitchen lactulose (CEPHULAC) 10 g packet Take 1 packet (10 g total) by mouth 3 (three) times daily. 90 each 1  . Lenvatinib 12 mg daily dose (LENVIMA) 3 x 4 MG capsule Take 12 mg by  mouth daily. 90 capsule 2  . lisinopril-hydrochlorothiazide (PRINZIDE,ZESTORETIC)  10-12.5 MG tablet Take 1 tablet by mouth daily. 90 tablet 0  . Multiple Vitamin (MULTIVITAMIN WITH MINERALS) TABS tablet Take 1 tablet by mouth daily.    . prochlorperazine (COMPAZINE) 10 MG tablet Take 1 tablet (10 mg total) by mouth every 6 (six) hours as needed for nausea or vomiting. 30 tablet 0  . spironolactone (ALDACTONE) 100 MG tablet Take 100 mg by mouth 2 (two) times daily.     . traZODone (DESYREL) 50 MG tablet Take 2 tablets (100 mg total) by mouth at bedtime. 60 tablet 0  . triamcinolone ointment (KENALOG) 0.5 % Apply 1 application topically 2 (two) times daily. 30 g 0   Current Facility-Administered Medications  Medication Dose Route Frequency Provider Last Rate Last Dose  . 0.9 %  sodium chloride infusion  500 mL Intravenous Continuous Danis, Estill Cotta III, MD        REVIEW OF SYSTEMS:   Constitutional: Denies fevers, chills or abnormal night sweats  Eyes: Denies blurriness of vision, double vision or watery eyes Ears, nose, mouth, throat, and face: Denies mucositis or sore throat  Respiratory: Denies cough, dyspnea or wheezes Cardiovascular: Denies palpitation, chest discomfort or LL edema (+) recent fall after getting up Gastrointestinal:  Denies nausea, heartburn. No new changes in BM Skin: No new skin rash or discoloration. (+) multiple bruises on left upper chest, neck, scalp, and left hip and thigh Lymphatics: Denies new lymphadenopathy or easy bruising Neurological:Denies numbness, tingling or new weaknesses (+) dizziness, standing and sitting MSK: (+) bilateral toe pain and numbness in feet, left 5th toe pain and pus (+) coccyx pain and ulcer (+) frequent cramps Behavioral/Psych: Mood is stable, no new changes  All other systems were reviewed with the patient and are negative.  PHYSICAL EXAMINATION:  ECOG PERFORMANCE STATUS: 2 BP 98/78 (BP Location: Left Arm, Patient Position: Sitting)    Pulse (!) 102 Comment: nurse aware of pulse  Temp 97.7 F (36.5 C) (Oral)   Resp 18   Ht 5\' 11"  (1.803 m)   Wt 126 lb 11.2 oz (57.5 kg)   SpO2 100%   BMI 17.67 kg/m   GENERAL:alert, no distress and comfortable (+) wheelchair SKIN: skin color, texture, turgor are normal, or significant lesions except the skin ulcers at coccyx and left 4th toe (+) multiple bruises on left upper chest, neck, scalp, and left hip and thigh EYES: normal, conjunctiva are pink and non-injected, sclera clear OROPHARYNX:no exudate, no erythema and lips, buccal mucosa, and tongue normal  NECK: supple, thyroid normal size, non-tender, without nodularity LYMPH:  no palpable lymphadenopathy in the cervical, axillary or inguinal LUNGS: clear to auscultation and percussion with normal breathing effort HEART: regular rate. (+) systolic murmur in the metrial valve area. no lower extremity edema  ABDOMEN:abdomen soft, normal bowel sounds (+) Liver enlarged about 5 cm below ribcage (+) mild to moderate ascitics  Musculoskeletal:no cyanosis of digits and no clubbing (+) left hand cramps PSYCH: alert & oriented x 3 with fluent speech NEURO: no focal motor/sensory deficits  LABORATORY DATA:  I have reviewed the data as listed CBC Latest Ref Rng & Units 02/13/2018 01/24/2018 12/26/2017  WBC 4.0 - 10.3 K/uL 13.0(H) 9.1 9.3  Hemoglobin 13.0 - 17.1 g/dL 15.2 14.0 15.4  Hematocrit 38.4 - 49.9 % 44.8 41.6 46.0  Platelets 140 - 400 K/uL 297 230 237   CMP Latest Ref Rng & Units 01/24/2018 01/08/2018 12/26/2017  Glucose 70 - 99 mg/dL 91 97 124(H)  BUN 8 - 23 mg/dL  12 11 17   Creatinine 0.61 - 1.24 mg/dL 0.68 0.69 0.70  Sodium 135 - 145 mmol/L 130(L) 134(L) 133(L)  Potassium 3.5 - 5.1 mmol/L 4.4 4.1 4.1  Chloride 98 - 111 mmol/L 92(L) 99 97(L)  CO2 22 - 32 mmol/L 28 26 25   Calcium 8.9 - 10.3 mg/dL 8.8(L) 8.8(L) 9.0  Total Protein 6.5 - 8.1 g/dL 6.9 7.6 7.4  Total Bilirubin 0.3 - 1.2 mg/dL 1.0 0.8 1.1  Alkaline Phos 38 - 126 U/L  317(H) 364(H) 367(H)  AST 15 - 41 U/L 61(H) 61(H) 119(H)  ALT 0 - 44 U/L 30 30 68(H)   AFP (<6.1ng/ml): 06/28/2017: 5.3 04/19/2017: 8.7 10/09/2017: 12.6 12/26/17: 27.2 01/24/18: 20.8  PROCEDURES  01/27/2018 IR Paracentesis IMPRESSION: Successful ultrasound-guided paracentesis yielding 2.4 liters of peritoneal fluid. Pathology revealed NO MALIGNANT CELLS IDENTIFIED.  01/10/2018 Paracentesis  IMPRESSION: Successful ultrasound-guided therapeutic paracentesis yielding 3.4 liters of peritoneal fluid.  Upper Endoscopy by Dr. Pincus Sanes 04/29/17 IMPRESSION - Normal larynx. - Normal esophagus. - Portal hypertensive gastropathy. Biopsied. - Normal examined duodenum.   RADIOGRAPHIC STUDIES: I have personally reviewed the radiological images as listed and agreed with the findings in the report.  01/21/2018 MRI Abdomen IMPRESSION: 1. Direct comparison to the prior exam is limited secondary to lack of well timed arterial phase imaging today. By comparing portal venous phase images of the 2 exams, the tumor burden is primarily felt to be decreased. There is likely a new or progressive lesion within the high anterior right liver lobe, as detailed above. 2. No findings of metastatic disease within the abdomen. 3. Moderate volume ascites. 4. Cholelithiasis.  MRI Abdomen W Wo Contrast 10/17/2015 IMPRESSION: 1. Mild progression of multifocal hepatocellular carcinoma in the cirrhotic liver. Disease is predominantly in the right hepatic lobe and appears relatively stable although new foci of hypervascular lesions are noted medial and cranial to the segment VI ablation defect with washout characteristics compatible with HCC. Tiny new foci of early hyperenhancement in the left liver also concerning for additional sites of new disease. 2. Cholelithiasis.    ASSESSMENT & PLAN:   62 y.o. Caucasian male with a personal history of alcohol abuse, smoking, cirrhosis of the liver, and chronic  hepatitis-C.  1. Hepatocellular Carcinoma, multifocal (2) in right lobe, stage II, progressed in liver 04/2017  - Giving his typial MRI image findings, which is consistent with hepatocellular carcinoma, and underline liver cirrhosis, his diagnosis is quite certain. This was the consensus from our GI tumor board and tissue biopsy was not felt to be necessary.  -Due to his significant liver cirrhosis and multifocal disease, surgeon Dr. Barry Dienes did not feel he is a candidate for surgical candidate  -Due to his alcohol abuse, he was not a candidate for liver transplant. However he has stopped drinking alcohol, he has been to the liver clinic to see NP [name] to discuss liver transplant.   -The patient saw Dr. Pascal Lux in March 2018 and underwent Liver embolization and ablation by Dr. Pascal Lux in 09/2016.  -MRI Abdomen from 01/10/17 showed the treated lesions were smaller.  -MRI abdomen in 04/26/17 which showed multiple new lesions in the liver, suspicious for recurrence. This was reviewed with pt and he subsequently underwent Y 90 treatment in February 2019. -I reviewed his recent restaging abdominal MRI from 10/16/2017, which unfortunately showed further disease progression in the liver.  His AFP has slightly increased also lately, consistent with disease progression. -I previously started him on first line Lenvatinib -he has been tolerating Lenvatinib  well overall.  -He had a successful ultrasound-guided therapeutic paracentesis yielding 3.4 liters of peritoneal fluid on 01/10/2018.  He was requiring paracentesis every 10 days, but seemed to be slowing down now. -MRI on 01/21/2018 imaging was reviewed by myself, and I agree with the radiology interpretation.  I discussed the findings with patient and his mother, hepatic lesions overall stable lesions has decreased, except a 1 lesion.   -Paracentesis on 01/27/2018 yielded 2.4 L.  -although he is responding to Lenvatinib, his overall condition is still full,  likely related to his [place], I recommend adding Keytruda to Lenvatinib.  Beryle Flock has been approved as a second line therapy for hepatocellular carcinoma, the combination of Keytruda and lenvatinib has been shown to to be safe, and the more effective early phase clinical trial.  The combination is currently being invested in phase 3 trial, but has not been approved.  I discussed with patient, potential side effects of Keytruda, especially immune related side effects, such as colitis, pneumonitis, skin rash, hypothyroidism, other endocrine dysfunction, etc.  He agreed. Plan to start next week.  -He's complaining of dizziness and cramps. I advised him to take multivitamins and minerals. I also prescribed  Baclofen to relief his cramps. -F/u with second infusion -will do paracentesis as needed for symptom relief  2. Multiple Lung Nodules  -His CT Chest from 05/30/17 showed multiple lung nodules that do not favor metastasis. Will monitor.   3. Alcohol and Smoking Cessation -The patient has gradually lessened the amount he drank over time. -He has completely stopped drinking alcohol in March 2018. -He has stopped smoking as of 10/29/17 -I encouraged the patient to continue abstaining from alcohol and smoking consumption.  4. Hepatitis-C, untreated -The patient has a history of chronic hepatitis-C. -he will follow up with [name], NP, and possible receive hep C treatment after his cancer treatment.  5. 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  He is on lactulose, Lasix and Aldactone now. -follow up with Dr. Loletha Carrow and Arrie Aran  -He has required paracentesis every 10 days on average, I sent a message to [facility] to see if she would like to increase his Lasix and Aldactone, he has an appointment to see down in a  months. -We have discussed with Dr. Pascal Lux and he feels he will not benefit from TIP  6. HTN/hypotension -On Lisinopril and HCTZ -He was recently started on Lasix and Aldactone by [name] due to ascites and edema.  - I encouraged him to get BP meter to check his level at home daily. I discussed if his Systolic BP drops below 093 he should hold his BP medication, Lasix and spironolactone. -I discussed with chemotherapy his BP may increase, we will have to monitor his BP closely and change his medication as needed.  -He recently fell after getting up from his bed. I suspect this is due to hypotension. I advised him to stop Prinizide.  7. Hearing loss in left ear -He complains of having episodes of not being able to hear well. It is intermitted and not there all the time -I advised him to see his PCP for this  8. Nausea and vomiting -He has mild intermittent nausea no vomiting, probably related to liver cancer and decompensated liver cirrhosis. -Continue Compazine as needed, improved overall.  9.Leg Cramps -I advised him  to recude salt intake and avoid canned food, and drink water adequately -I encouraged him to renew multivitamins, which contains calcium, low-dose potassium and magnesium.  PLAN -I prescribed Baclofen for his cramps -Will start Keytruda next week -Continue Lenvatinib -keytruda in 1-2 weeks and 3 weeks after -Lab and f/u with second infusion   All questions were answered. The patient knows to call the clinic with any problems, questions or concerns. I spent 25 minutes counseling the patient face to face. The total time spent in the appointment was 30 minutes and more than 50% was on counseling.  Dierdre Searles Dweik am acting as scribe for Dr. Truitt Merle.  I have reviewed the above documentation for accuracy and completeness, and I agree with the above.      Truitt Merle, MD 02/13/2018

## 2018-02-10 NOTE — Patient Instructions (Signed)
We are setting up home health care for nursing skin care assessment

## 2018-02-13 ENCOUNTER — Inpatient Hospital Stay: Payer: BLUE CROSS/BLUE SHIELD | Attending: Hematology

## 2018-02-13 ENCOUNTER — Inpatient Hospital Stay (HOSPITAL_BASED_OUTPATIENT_CLINIC_OR_DEPARTMENT_OTHER): Payer: BLUE CROSS/BLUE SHIELD | Admitting: Hematology

## 2018-02-13 ENCOUNTER — Telehealth: Payer: Self-pay

## 2018-02-13 VITALS — BP 98/78 | HR 102 | Temp 97.7°F | Resp 18 | Ht 71.0 in | Wt 126.7 lb

## 2018-02-13 DIAGNOSIS — H9192 Unspecified hearing loss, left ear: Secondary | ICD-10-CM | POA: Diagnosis not present

## 2018-02-13 DIAGNOSIS — M79675 Pain in left toe(s): Secondary | ICD-10-CM | POA: Diagnosis not present

## 2018-02-13 DIAGNOSIS — R188 Other ascites: Secondary | ICD-10-CM | POA: Insufficient documentation

## 2018-02-13 DIAGNOSIS — K8020 Calculus of gallbladder without cholecystitis without obstruction: Secondary | ICD-10-CM | POA: Insufficient documentation

## 2018-02-13 DIAGNOSIS — Z79899 Other long term (current) drug therapy: Secondary | ICD-10-CM | POA: Insufficient documentation

## 2018-02-13 DIAGNOSIS — R252 Cramp and spasm: Secondary | ICD-10-CM | POA: Insufficient documentation

## 2018-02-13 DIAGNOSIS — K766 Portal hypertension: Secondary | ICD-10-CM | POA: Diagnosis not present

## 2018-02-13 DIAGNOSIS — F1010 Alcohol abuse, uncomplicated: Secondary | ICD-10-CM | POA: Diagnosis not present

## 2018-02-13 DIAGNOSIS — K3189 Other diseases of stomach and duodenum: Secondary | ICD-10-CM

## 2018-02-13 DIAGNOSIS — Z87891 Personal history of nicotine dependence: Secondary | ICD-10-CM | POA: Insufficient documentation

## 2018-02-13 DIAGNOSIS — I10 Essential (primary) hypertension: Secondary | ICD-10-CM | POA: Diagnosis not present

## 2018-02-13 DIAGNOSIS — B182 Chronic viral hepatitis C: Secondary | ICD-10-CM

## 2018-02-13 DIAGNOSIS — C220 Liver cell carcinoma: Secondary | ICD-10-CM

## 2018-02-13 DIAGNOSIS — K7460 Unspecified cirrhosis of liver: Secondary | ICD-10-CM | POA: Diagnosis not present

## 2018-02-13 DIAGNOSIS — K7031 Alcoholic cirrhosis of liver with ascites: Secondary | ICD-10-CM

## 2018-02-13 LAB — COMPREHENSIVE METABOLIC PANEL
ALT: 34 U/L (ref 0–44)
ANION GAP: 9 (ref 5–15)
AST: 67 U/L — ABNORMAL HIGH (ref 15–41)
Albumin: 2.2 g/dL — ABNORMAL LOW (ref 3.5–5.0)
Alkaline Phosphatase: 318 U/L — ABNORMAL HIGH (ref 38–126)
BUN: 12 mg/dL (ref 8–23)
CHLORIDE: 88 mmol/L — AB (ref 98–111)
CO2: 27 mmol/L (ref 22–32)
CREATININE: 0.65 mg/dL (ref 0.61–1.24)
Calcium: 8.2 mg/dL — ABNORMAL LOW (ref 8.9–10.3)
GFR calc non Af Amer: >60 mL/min (ref >60)
Glucose, Bld: 144 mg/dL — ABNORMAL HIGH (ref 70–99)
POTASSIUM: 4.2 mmol/L (ref 3.5–5.1)
Sodium: 124 mmol/L — ABNORMAL LOW (ref 135–145)
Total Bilirubin: 1.2 mg/dL (ref 0.3–1.2)
Total Protein: 6.9 g/dL (ref 6.5–8.1)

## 2018-02-13 LAB — CBC WITH DIFFERENTIAL/PLATELET
Basophils Absolute: 0 10*3/uL (ref 0.0–0.1)
Basophils Relative: 0 %
EOS ABS: 0 10*3/uL (ref 0.0–0.5)
Eosinophils Relative: 0 %
HCT: 44.8 % (ref 38.4–49.9)
HEMOGLOBIN: 15.2 g/dL (ref 13.0–17.1)
LYMPHS ABS: 0.2 10*3/uL — AB (ref 0.9–3.3)
Lymphocytes Relative: 2 %
MCH: 32.5 pg (ref 27.2–33.4)
MCHC: 34 g/dL (ref 32.0–36.0)
MCV: 95.5 fL (ref 79.3–98.0)
Monocytes Absolute: 1.7 10*3/uL — ABNORMAL HIGH (ref 0.1–0.9)
Monocytes Relative: 13 %
NEUTROS ABS: 11 10*3/uL — AB (ref 1.5–6.5)
NEUTROS PCT: 85 %
Platelets: 297 10*3/uL (ref 140–400)
RBC: 4.69 MIL/uL (ref 4.20–5.82)
RDW: 20.1 % — ABNORMAL HIGH (ref 11.0–14.6)
WBC: 13 10*3/uL — AB (ref 4.0–10.3)

## 2018-02-13 LAB — MAGNESIUM: MAGNESIUM: 1.9 mg/dL (ref 1.7–2.4)

## 2018-02-13 MED ORDER — BACLOFEN 5 MG PO TABS: 5.0000 mg | ORAL_TABLET | Freq: Three times a day (TID) | ORAL | 0 refills | Status: DC | PRN

## 2018-02-13 NOTE — Telephone Encounter (Signed)
Printed avs and calender of upcoming appointment. Per 10/3 los Patient requested to be scheduled on Tuesdays.

## 2018-02-13 NOTE — Progress Notes (Signed)
START OFF PATHWAY REGIMEN - [Other Dx]   [id number]:Pembrolizumab 200 mg q21 Days:   A cycle is 21 days:     Pembrolizumab   **Always confirm dose/schedule in your pharmacy ordering system**  Patient Characteristics: Intent of Therapy: Non-Curative / Palliative Intent, Discussed with Patient 

## 2018-02-14 ENCOUNTER — Encounter: Payer: Self-pay | Admitting: Hematology

## 2018-02-14 ENCOUNTER — Telehealth: Payer: Self-pay

## 2018-02-14 ENCOUNTER — Telehealth: Payer: Self-pay | Admitting: Family Medicine

## 2018-02-14 ENCOUNTER — Other Ambulatory Visit: Payer: Self-pay | Admitting: Hematology

## 2018-02-14 LAB — AFP TUMOR MARKER: AFP, Serum, Tumor Marker: 18.8 ng/mL — ABNORMAL HIGH (ref 0.0–8.3)

## 2018-02-14 NOTE — Telephone Encounter (Signed)
Please advise 

## 2018-02-14 NOTE — Telephone Encounter (Signed)
Spoke with his mom.  Keflex was actually inexpensive and it was med prescribed per oncology that was 78$

## 2018-02-14 NOTE — Telephone Encounter (Signed)
-----   Message from Alla Feeling, NP sent at 02/14/2018 12:06 PM EDT ----- Please let him know AFP. Improving.  Thanks, Regan Rakers

## 2018-02-14 NOTE — Telephone Encounter (Signed)
Copied from [place] [id number]. Topic: Quick Communication - See Telephone Encounter >> Feb 14, 2018 12:48 PM Vernona Rieger wrote: CRM for notification. See Telephone encounter for: 02/14/18.  Patient's mother called and states the script for cephALEXin (KEFLEX) 500 MG capsule is going to be $78 and he can not afford that. Patient was in the office on 9/30. Patient's mother said he has never had to pay anything for his medications. Please Advise

## 2018-02-14 NOTE — Telephone Encounter (Signed)
Called patient per Joshua Rue NP tumor marker continues to trend downward, patient verbalized an understanding.

## 2018-02-19 ENCOUNTER — Telehealth: Payer: Self-pay

## 2018-02-19 NOTE — Telephone Encounter (Signed)
Please advise 

## 2018-02-19 NOTE — Telephone Encounter (Signed)
Copied from [place]. Topic: Inquiry >> Feb 19, 2018 11:44 AM Oliver Pila B wrote: Reason for CRM: [facility] for home skilled nursing for disease management and wound care, OT & PT and medical social work evaluations;  contact Nicolette @ [phone]

## 2018-02-19 NOTE — Telephone Encounter (Signed)
ok 

## 2018-02-19 NOTE — Telephone Encounter (Signed)
Called Nicolette and gave her the verbal OK per Dr. Elease Hashimoto. Nicolette stated that they are waiting for the approval from [facility] for these orders to be approved and will proceed once approved by [employer].

## 2018-02-20 ENCOUNTER — Telehealth: Payer: Self-pay

## 2018-02-20 ENCOUNTER — Telehealth: Payer: Self-pay | Admitting: General Practice

## 2018-02-20 ENCOUNTER — Other Ambulatory Visit: Payer: Self-pay | Admitting: Hematology

## 2018-02-20 ENCOUNTER — Other Ambulatory Visit: Payer: Self-pay

## 2018-02-20 DIAGNOSIS — C220 Liver cell carcinoma: Secondary | ICD-10-CM

## 2018-02-20 NOTE — Telephone Encounter (Signed)
Patient's mother called stating that she is no longer able to lift on the patient. He has fallen twice in the last week and they have had to call someone to get him up.  He is not eating.  She wants to see about SNF for rehab.  I told her I will speak to Dr. Burr Medico and have one of our social workers call her.

## 2018-02-20 NOTE — Telephone Encounter (Signed)
[place] CSW Progress Notes  Request received from [facility] to talk w mother, Queen Slough [phone]).  States that patient has fallen twice at home over past week, has increasing difficulty w walking.  Family has purchased walker for him to use.  [place] is engaged w family, per note he will receive OT/PT/social work referrals.  Mother very concerned about her ability to continue to provide care for patient at home -  "I am 13, my husband is 8, we cannot physically help him." Mother is caring for husband.  Has great difficulty getting care for patient due to care needs of her spouse.  Patient lives alone but parents are active in helping care for him.  Parents have called [employer], was told by "Langley Gauss" at [facility] that his policy may cover SNF placement at [facility] and [facility] care. Patient is eligible for a 60 day placement per mother.  Family has been given a phone number for authorizations. Family has been transporting him to appointments; however, mother is worried she will not be able to get him in the car to get him to appointments.  Mother encouraged to call CSW w updates and needs.   Edwyna Shell, LCSW Clinical Social Worker Phone:  [phone]

## 2018-02-21 ENCOUNTER — Inpatient Hospital Stay (HOSPITAL_COMMUNITY): Payer: BLUE CROSS/BLUE SHIELD

## 2018-02-21 ENCOUNTER — Telehealth: Payer: Self-pay | Admitting: General Practice

## 2018-02-21 ENCOUNTER — Inpatient Hospital Stay (HOSPITAL_COMMUNITY)
Admission: EM | Admit: 2018-02-21 | Discharge: 2018-03-05 | DRG: 640 | Disposition: A | Discharge status: Skilled Nursing Facility | Payer: BLUE CROSS/BLUE SHIELD | Attending: Internal Medicine | Admitting: Internal Medicine

## 2018-02-21 ENCOUNTER — Ambulatory Visit: Payer: Self-pay | Admitting: Family Medicine

## 2018-02-21 ENCOUNTER — Encounter (HOSPITAL_COMMUNITY): Payer: Self-pay | Admitting: Emergency Medicine

## 2018-02-21 ENCOUNTER — Emergency Department (HOSPITAL_COMMUNITY): Payer: BLUE CROSS/BLUE SHIELD

## 2018-02-21 ENCOUNTER — Other Ambulatory Visit: Payer: Self-pay

## 2018-02-21 DIAGNOSIS — E875 Hyperkalemia: Secondary | ICD-10-CM | POA: Diagnosis present

## 2018-02-21 DIAGNOSIS — E43 Unspecified severe protein-calorie malnutrition: Secondary | ICD-10-CM | POA: Diagnosis present

## 2018-02-21 DIAGNOSIS — L97529 Non-pressure chronic ulcer of other part of left foot with unspecified severity: Secondary | ICD-10-CM | POA: Diagnosis present

## 2018-02-21 DIAGNOSIS — W19XXXA Unspecified fall, initial encounter: Secondary | ICD-10-CM | POA: Diagnosis not present

## 2018-02-21 DIAGNOSIS — Z681 Body mass index (BMI) 19 or less, adult: Secondary | ICD-10-CM

## 2018-02-21 DIAGNOSIS — I96 Gangrene, not elsewhere classified: Secondary | ICD-10-CM | POA: Diagnosis present

## 2018-02-21 DIAGNOSIS — Y9301 Activity, walking, marching and hiking: Secondary | ICD-10-CM | POA: Diagnosis present

## 2018-02-21 DIAGNOSIS — R21 Rash and other nonspecific skin eruption: Secondary | ICD-10-CM | POA: Diagnosis present

## 2018-02-21 DIAGNOSIS — R1084 Generalized abdominal pain: Secondary | ICD-10-CM | POA: Diagnosis not present

## 2018-02-21 DIAGNOSIS — D72829 Elevated white blood cell count, unspecified: Secondary | ICD-10-CM | POA: Diagnosis not present

## 2018-02-21 DIAGNOSIS — E860 Dehydration: Secondary | ICD-10-CM | POA: Diagnosis present

## 2018-02-21 DIAGNOSIS — Y92009 Unspecified place in unspecified non-institutional (private) residence as the place of occurrence of the external cause: Secondary | ICD-10-CM | POA: Diagnosis not present

## 2018-02-21 DIAGNOSIS — R9431 Abnormal electrocardiogram [ECG] [EKG]: Secondary | ICD-10-CM | POA: Diagnosis present

## 2018-02-21 DIAGNOSIS — E8770 Fluid overload, unspecified: Secondary | ICD-10-CM | POA: Diagnosis present

## 2018-02-21 DIAGNOSIS — G92 Toxic encephalopathy: Secondary | ICD-10-CM | POA: Diagnosis present

## 2018-02-21 DIAGNOSIS — B1920 Unspecified viral hepatitis C without hepatic coma: Secondary | ICD-10-CM | POA: Diagnosis not present

## 2018-02-21 DIAGNOSIS — Z515 Encounter for palliative care: Secondary | ICD-10-CM | POA: Diagnosis present

## 2018-02-21 DIAGNOSIS — G4700 Insomnia, unspecified: Secondary | ICD-10-CM | POA: Diagnosis present

## 2018-02-21 DIAGNOSIS — R296 Repeated falls: Secondary | ICD-10-CM | POA: Diagnosis present

## 2018-02-21 DIAGNOSIS — Z66 Do not resuscitate: Secondary | ICD-10-CM

## 2018-02-21 DIAGNOSIS — L98499 Non-pressure chronic ulcer of skin of other sites with unspecified severity: Secondary | ICD-10-CM | POA: Diagnosis present

## 2018-02-21 DIAGNOSIS — B182 Chronic viral hepatitis C: Secondary | ICD-10-CM | POA: Diagnosis present

## 2018-02-21 DIAGNOSIS — K7031 Alcoholic cirrhosis of liver with ascites: Secondary | ICD-10-CM | POA: Diagnosis present

## 2018-02-21 DIAGNOSIS — R531 Weakness: Secondary | ICD-10-CM | POA: Diagnosis present

## 2018-02-21 DIAGNOSIS — L97509 Non-pressure chronic ulcer of other part of unspecified foot with unspecified severity: Secondary | ICD-10-CM

## 2018-02-21 DIAGNOSIS — I959 Hypotension, unspecified: Secondary | ICD-10-CM | POA: Diagnosis present

## 2018-02-21 DIAGNOSIS — F1020 Alcohol dependence, uncomplicated: Secondary | ICD-10-CM | POA: Diagnosis present

## 2018-02-21 DIAGNOSIS — E871 Hypo-osmolality and hyponatremia: Secondary | ICD-10-CM | POA: Diagnosis present

## 2018-02-21 DIAGNOSIS — Z87891 Personal history of nicotine dependence: Secondary | ICD-10-CM

## 2018-02-21 DIAGNOSIS — R109 Unspecified abdominal pain: Secondary | ICD-10-CM

## 2018-02-21 DIAGNOSIS — T502X5A Adverse effect of carbonic-anhydrase inhibitors, benzothiadiazides and other diuretics, initial encounter: Secondary | ICD-10-CM | POA: Diagnosis present

## 2018-02-21 DIAGNOSIS — L89153 Pressure ulcer of sacral region, stage 3: Secondary | ICD-10-CM | POA: Diagnosis present

## 2018-02-21 DIAGNOSIS — R188 Other ascites: Secondary | ICD-10-CM | POA: Diagnosis not present

## 2018-02-21 DIAGNOSIS — R630 Anorexia: Secondary | ICD-10-CM | POA: Diagnosis not present

## 2018-02-21 DIAGNOSIS — I10 Essential (primary) hypertension: Secondary | ICD-10-CM | POA: Diagnosis present

## 2018-02-21 DIAGNOSIS — L8990 Pressure ulcer of unspecified site, unspecified stage: Secondary | ICD-10-CM

## 2018-02-21 DIAGNOSIS — K7030 Alcoholic cirrhosis of liver without ascites: Secondary | ICD-10-CM | POA: Diagnosis present

## 2018-02-21 DIAGNOSIS — Z7401 Bed confinement status: Secondary | ICD-10-CM

## 2018-02-21 DIAGNOSIS — C220 Liver cell carcinoma: Secondary | ICD-10-CM | POA: Diagnosis present

## 2018-02-21 DIAGNOSIS — Z79899 Other long term (current) drug therapy: Secondary | ICD-10-CM

## 2018-02-21 DIAGNOSIS — W1839XA Other fall on same level, initial encounter: Secondary | ICD-10-CM | POA: Diagnosis present

## 2018-02-21 LAB — CBC
HEMATOCRIT: 41.9 % (ref 39.0–52.0)
HEMOGLOBIN: 14.3 g/dL (ref 13.0–17.0)
MCH: 31.7 pg (ref 26.0–34.0)
MCHC: 34.1 g/dL (ref 30.0–36.0)
MCV: 92.9 fL (ref 80.0–100.0)
PLATELETS: 221 10*3/uL (ref 150–400)
RBC: 4.51 MIL/uL (ref 4.22–5.81)
RDW: 18 % — ABNORMAL HIGH (ref 11.5–15.5)
WBC: 19.3 10*3/uL — ABNORMAL HIGH (ref 4.0–10.5)
nRBC: 0 % (ref 0.0–0.2)

## 2018-02-21 LAB — CBC WITH DIFFERENTIAL/PLATELET
Abs Immature Granulocytes: 0.13 10*3/uL — ABNORMAL HIGH (ref 0.00–0.07)
Basophils Absolute: 0 10*3/uL (ref 0.0–0.1)
Basophils Relative: 0 %
Eosinophils Absolute: 0 10*3/uL (ref 0.0–0.5)
Eosinophils Relative: 0 %
HCT: 45 % (ref 39.0–52.0)
HEMOGLOBIN: 15.6 g/dL (ref 13.0–17.0)
Immature Granulocytes: 1 %
LYMPHS ABS: 0.5 10*3/uL — AB (ref 0.7–4.0)
LYMPHS PCT: 2 %
MCH: 31.6 pg (ref 26.0–34.0)
MCHC: 34.7 g/dL (ref 30.0–36.0)
MCV: 91.3 fL (ref 80.0–100.0)
MONOS PCT: 14 %
Monocytes Absolute: 2.8 10*3/uL — ABNORMAL HIGH (ref 0.1–1.0)
NEUTROS ABS: 16.1 10*3/uL — AB (ref 1.7–7.7)
Neutrophils Relative %: 83 %
Platelets: 250 10*3/uL (ref 150–400)
RBC: 4.93 MIL/uL (ref 4.22–5.81)
RDW: 17.8 % — ABNORMAL HIGH (ref 11.5–15.5)
WBC: 19.6 10*3/uL — ABNORMAL HIGH (ref 4.0–10.5)
nRBC: 0 % (ref 0.0–0.2)

## 2018-02-21 LAB — URINALYSIS, ROUTINE W REFLEX MICROSCOPIC
BILIRUBIN URINE: NEGATIVE
Glucose, UA: NEGATIVE mg/dL
Ketones, ur: NEGATIVE mg/dL
NITRITE: NEGATIVE
PROTEIN: NEGATIVE mg/dL
SPECIFIC GRAVITY, URINE: 1.004 — AB (ref 1.005–1.030)
pH: 6 (ref 5.0–8.0)

## 2018-02-21 LAB — COMPREHENSIVE METABOLIC PANEL
ALT: 48 U/L — AB (ref 0–44)
AST: 98 U/L — AB (ref 15–41)
Albumin: 2.2 g/dL — ABNORMAL LOW (ref 3.5–5.0)
Alkaline Phosphatase: 310 U/L — ABNORMAL HIGH (ref 38–126)
Anion gap: 15 (ref 5–15)
BUN: 17 mg/dL (ref 8–23)
CHLORIDE: 78 mmol/L — AB (ref 98–111)
CO2: 24 mmol/L (ref 22–32)
Calcium: 8.4 mg/dL — ABNORMAL LOW (ref 8.9–10.3)
Creatinine, Ser: 0.5 mg/dL — ABNORMAL LOW (ref 0.61–1.24)
GFR calc Af Amer: >60 mL/min (ref >60)
Glucose, Bld: 100 mg/dL — ABNORMAL HIGH (ref 70–99)
Potassium: 5.4 mmol/L — ABNORMAL HIGH (ref 3.5–5.1)
SODIUM: 117 mmol/L — AB (ref 135–145)
Total Bilirubin: 1.8 mg/dL — ABNORMAL HIGH (ref 0.3–1.2)
Total Protein: 6.9 g/dL (ref 6.5–8.1)

## 2018-02-21 LAB — BASIC METABOLIC PANEL
ANION GAP: 11 (ref 5–15)
ANION GAP: 10 (ref 5–15)
BUN: 15 mg/dL (ref 8–23)
BUN: 13 mg/dL (ref 8–23)
CHLORIDE: 85 mmol/L — AB (ref 98–111)
CO2: 26 mmol/L (ref 22–32)
CO2: 26 mmol/L (ref 22–32)
Calcium: 8.4 mg/dL — ABNORMAL LOW (ref 8.9–10.3)
Calcium: 8.1 mg/dL — ABNORMAL LOW (ref 8.9–10.3)
Chloride: 86 mmol/L — ABNORMAL LOW (ref 98–111)
Creatinine, Ser: 0.48 mg/dL — ABNORMAL LOW (ref 0.61–1.24)
Creatinine, Ser: 0.44 mg/dL — ABNORMAL LOW (ref 0.61–1.24)
GFR calc Af Amer: >60 mL/min (ref >60)
GFR calc Af Amer: >60 mL/min (ref >60)
GFR calc non Af Amer: >60 mL/min (ref >60)
GLUCOSE: 94 mg/dL (ref 70–99)
Glucose, Bld: 107 mg/dL — ABNORMAL HIGH (ref 70–99)
POTASSIUM: 4.7 mmol/L (ref 3.5–5.1)
POTASSIUM: 4.2 mmol/L (ref 3.5–5.1)
Sodium: 123 mmol/L — ABNORMAL LOW (ref 135–145)
Sodium: 121 mmol/L — ABNORMAL LOW (ref 135–145)

## 2018-02-21 LAB — LIPASE, BLOOD: LIPASE: 30 U/L (ref 11–51)

## 2018-02-21 LAB — ETHANOL: Alcohol, Ethyl (B): <10 mg/dL (ref <10)

## 2018-02-21 LAB — SODIUM, URINE, RANDOM: Sodium, Ur: <10 mmol/L

## 2018-02-21 LAB — AMMONIA: Ammonia: 22 umol/L (ref 9–35)

## 2018-02-21 LAB — SEDIMENTATION RATE: Sed Rate: 26 mm/hr — ABNORMAL HIGH (ref 0–16)

## 2018-02-21 LAB — BRAIN NATRIURETIC PEPTIDE: B Natriuretic Peptide: 153.6 pg/mL — ABNORMAL HIGH (ref 0.0–100.0)

## 2018-02-21 LAB — MAGNESIUM: Magnesium: 2 mg/dL (ref 1.7–2.4)

## 2018-02-21 LAB — C-REACTIVE PROTEIN: CRP: 14.8 mg/dL — AB (ref <1.0)

## 2018-02-21 LAB — PROTIME-INR
INR: 1.21
PROTHROMBIN TIME: 15.2 s (ref 11.4–15.2)

## 2018-02-21 LAB — MRSA PCR SCREENING: MRSA by PCR: NEGATIVE

## 2018-02-21 LAB — OSMOLALITY, URINE: Osmolality, Ur: 144 mOsm/kg — ABNORMAL LOW (ref 300–900)

## 2018-02-21 MED ORDER — SODIUM CHLORIDE 0.9 % IV SOLN
INTRAVENOUS | Status: DC
  Administered 2018-02-21: 17:00:00 via INTRAVENOUS

## 2018-02-21 MED ORDER — ENOXAPARIN SODIUM 40 MG/0.4ML ~~LOC~~ SOLN
40.0000 mg | SUBCUTANEOUS | Status: DC
  Administered 2018-02-21 – 2018-03-04 (×12): 40 mg via SUBCUTANEOUS
  Filled 2018-02-21 (×11): qty 0.4

## 2018-02-21 MED ORDER — B COMPLEX VITAMINS PO CAPS: 1.0000 | ORAL_CAPSULE | Freq: Every day | ORAL | Status: DC

## 2018-02-21 MED ORDER — SODIUM CHLORIDE 0.9 % IV BOLUS
1000.0000 mL | Freq: Once | INTRAVENOUS | Status: AC
  Administered 2018-02-21: 1000 mL via INTRAVENOUS

## 2018-02-21 MED ORDER — TRAZODONE HCL 50 MG PO TABS
100.0000 mg | ORAL_TABLET | Freq: Every day | ORAL | Status: DC
  Administered 2018-02-21 – 2018-03-05 (×13): 100 mg via ORAL
  Filled 2018-02-21 (×13): qty 2

## 2018-02-21 MED ORDER — ADULT MULTIVITAMIN W/MINERALS CH
1.0000 | ORAL_TABLET | Freq: Every day | ORAL | Status: DC
  Administered 2018-02-22 – 2018-03-05 (×12): 1 via ORAL
  Filled 2018-02-21 (×12): qty 1

## 2018-02-21 MED ORDER — B COMPLEX-C PO TABS
1.0000 | ORAL_TABLET | Freq: Every day | ORAL | Status: DC
  Administered 2018-02-22 – 2018-03-05 (×12): 1 via ORAL
  Filled 2018-02-21 (×13): qty 1

## 2018-02-21 MED ORDER — LACTULOSE 10 GM/15ML PO SOLN
10.0000 g | Freq: Three times a day (TID) | ORAL | Status: DC
  Administered 2018-02-21 – 2018-02-27 (×18): 10 g via ORAL
  Filled 2018-02-21 (×18): qty 15

## 2018-02-21 MED ORDER — DOXYCYCLINE HYCLATE 100 MG PO TABS
100.0000 mg | ORAL_TABLET | Freq: Two times a day (BID) | ORAL | Status: DC
  Administered 2018-02-21 – 2018-03-05 (×25): 100 mg via ORAL
  Filled 2018-02-21 (×25): qty 1

## 2018-02-21 MED ORDER — ENSURE ENLIVE PO LIQD
237.0000 mL | Freq: Two times a day (BID) | ORAL | Status: DC
  Administered 2018-02-21 – 2018-03-05 (×17): 237 mL via ORAL

## 2018-02-21 MED ORDER — AMOXICILLIN-POT CLAVULANATE 875-125 MG PO TABS
1.0000 | ORAL_TABLET | Freq: Two times a day (BID) | ORAL | Status: DC
  Administered 2018-02-21 – 2018-03-05 (×25): 1 via ORAL
  Filled 2018-02-21 (×25): qty 1

## 2018-02-21 NOTE — ED Notes (Signed)
Bed: WA07 Expected date:  Expected time:  Means of arrival:  Comments: 62 yo CA pt- gen weakness

## 2018-02-21 NOTE — Telephone Encounter (Signed)
Please see message. °

## 2018-02-21 NOTE — Telephone Encounter (Signed)
[place] CSW Progress Notes  Call from mother, states he has been hospitalized today after being found on the floor after a fall, incontinent.  Mother will work w inpatient CSWs on SNF placement for continued work on regaining strength.  Fabienne Bruns MD notified of admission, staff message sent.  Edwyna Shell, LCSW Clinical Social Worker Phone:  [phone]

## 2018-02-21 NOTE — Telephone Encounter (Signed)
Spoke with Dr Luetta Nutting. His step son is in ER now being evaluated.

## 2018-02-21 NOTE — Progress Notes (Signed)
Joshua [name]   DOB:June 07, 1955   WC#:[id number]   VOH#:[id number]  ONCOLOGY FOLLOW UP  Subjective: Patient is well-known to me, under my care for his liver cancer, he is currently on lenvatinib.  He presented to [facility] ED today with worsening weakness, multiple fall at home. He denies fever or chill, his appetite has been poor.    Objective:  Vitals:   02/21/18 2022 02/21/18 2229  BP: 97/68 94/68  Pulse: 94 92  Resp: 16 20  Temp:    SpO2: 96% 97%    Body mass index is 16.91 kg/m.  Intake/Output Summary (Last 24 hours) at 02/21/2018 2302 Last data filed at 02/21/2018 1812 Gross per 24 hour  Intake 310.25 ml  Output 200 ml  Net 110.25 ml     Sclerae unicteric  Oropharynx clear  No peripheral adenopathy  Lungs clear -- no rales or rhonchi  Heart regular rate and rhythm  Abdomen benign  MSK no focal spinal tenderness, no peripheral edema  Neuro nonfocal, he is wake and alert but answers questions slowly     CBG (last 3)  No results for input(s): GLUCAP in the last 72 hours.   Labs:  Lab Results  Component Value Date   WBC 19.3 (H) 02/21/2018   HGB 14.3 02/21/2018   HCT 41.9 02/21/2018   MCV 92.9 02/21/2018   PLT 221 02/21/2018   NEUTROABS 16.1 (H) 02/21/2018   / CMP Latest Ref Rng & Units 02/21/2018 02/21/2018 02/13/2018  Glucose 70 - 99 mg/dL 94 100(H) 144(H)  BUN 8 - 23 mg/dL 15 17 12   Creatinine 0.61 - 1.24 mg/dL 0.48(L) 0.50(L) 0.65  Sodium 135 - 145 mmol/L 123(L) 117(LL) 124(L)  Potassium 3.5 - 5.1 mmol/L 4.7 5.4(H) 4.2  Chloride 98 - 111 mmol/L 86(L) 78(L) 88(L)  CO2 22 - 32 mmol/L 26 24 27   Calcium 8.9 - 10.3 mg/dL 8.4(L) 8.4(L) 8.2(L)  Total Protein 6.5 - 8.1 g/dL - 6.9 6.9  Total Bilirubin 0.3 - 1.2 mg/dL - 1.8(H) 1.2  Alkaline Phos 38 - 126 U/L - 310(H) 318(H)  AST 15 - 41 U/L - 98(H) 67(H)  ALT 0 - 44 U/L - 48(H) 34     Urine Studies No results for input(s): UHGB, CRYS in the last 72 hours.  Invalid input(s): UACOL, UAPR, USPG, UPH, UTP, UGL,  UKET, UBIL, UNIT, UROB, ULEU, UEPI, UWBC, URBC, UBAC, CAST, UCOM, BILUA  Basic Metabolic Panel: Recent Labs  Lab 02/21/18 1236 02/21/18 1657  NA 117* 123*  K 5.4* 4.7  CL 78* 86*  CO2 24 26  GLUCOSE 100* 94  BUN 17 15  CREATININE 0.50* 0.48*  CALCIUM 8.4* 8.4*  MG 2.0  --    GFR Estimated Creatinine Clearance: 75.4 mL/min (A) (by C-G formula based on SCr of 0.48 mg/dL (L)). Liver Function Tests: Recent Labs  Lab 02/21/18 1236  AST 98*  ALT 48*  ALKPHOS 310*  BILITOT 1.8*  PROT 6.9  ALBUMIN 2.2*   Recent Labs  Lab 02/21/18 1236  LIPASE 30   Recent Labs  Lab 02/21/18 1657  AMMONIA 22   Coagulation profile Recent Labs  Lab 02/21/18 1500  INR 1.21    CBC: Recent Labs  Lab 02/21/18 1236 02/21/18 1657  WBC 19.6* 19.3*  NEUTROABS 16.1*  --   HGB 15.6 14.3  HCT 45.0 41.9  MCV 91.3 92.9  PLT 250 221   Cardiac Enzymes: No results for input(s): CKTOTAL, CKMB, CKMBINDEX, TROPONINI in the last 168 hours. BNP:  Invalid input(s): POCBNP CBG: No results for input(s): GLUCAP in the last 168 hours. D-Dimer No results for input(s): DDIMER in the last 72 hours. Hgb A1c No results for input(s): HGBA1C in the last 72 hours. Lipid Profile No results for input(s): CHOL, HDL, LDLCALC, TRIG, CHOLHDL, LDLDIRECT in the last 72 hours. Thyroid function studies No results for input(s): TSH, T4TOTAL, T3FREE, THYROIDAB in the last 72 hours.  Invalid input(s): FREET3 Anemia work up No results for input(s): VITAMINB12, FOLATE, FERRITIN, TIBC, IRON, RETICCTPCT in the last 72 hours. Microbiology Recent Results (from the past 240 hour(s))  MRSA PCR Screening     Status: None   Collection Time: 02/21/18  4:00 PM  Result Value Ref Range Status   MRSA by PCR NEGATIVE NEGATIVE Final    Comment:        The GeneXpert MRSA Assay (FDA approved for NASAL specimens only), is one component of a comprehensive MRSA colonization surveillance program. It is not intended to diagnose  MRSA infection nor to guide or monitor treatment for MRSA infections. Performed at [facility], [place] [address], [place], [place] [address]       Studies:  Dg Chest 2 View  Result Date: 02/21/2018 CLINICAL DATA:  62 year old male with multiple recent falls. Recent increased weakness. History of progressive hepatocellular carcinoma. EXAM: CHEST - 2 VIEW COMPARISON:  Chest CT 11/12/2017.  One-view chest 10/04/2016. FINDINGS: Semi upright AP and lateral views of the chest. Lower lung volumes. Allowing for portable technique the lungs are clear. Mediastinal contours remain normal. No pneumothorax or pleural effusion. No acute osseous abnormality identified. Paucity of bowel gas in the upper abdomen. IMPRESSION: No acute cardiopulmonary abnormality. Electronically Signed   By: Genevie Ann M.D.   On: 02/21/2018 13:29   Dg Lumbar Spine Complete  Result Date: 02/21/2018 CLINICAL DATA:  62 year old male with multiple recent falls. Increased weakness. Mid back pain radiating down the left leg. History of progressive HCC. EXAM: LUMBAR SPINE - COMPLETE 4+ VIEW COMPARISON:  CT Abdomen and Pelvis 06/17/2017. FINDINGS: Extensive Aortoiliac calcified atherosclerosis. Normal lumbar segmentation. Mild T12 superior endplate deformity appears stable since February. Stable lumbar vertebral height and alignment. No pars fracture. The sacral ala and SI joints appear intact. Chronic severe disc space loss at L2-L3 with vacuum disc and endplate spurring. Better preserved disc spaces elsewhere with intermittent endplate spurring. IMPRESSION: 1.  No acute osseous abnormality identified in the lumbar spine. 2. 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 3. Extensive aortic Atherosclerosis (ICD10-I70.0). Electronically Signed   By: Genevie Ann M.D.   On: 02/21/2018 13:32   Dg Finger Ring Left  Result Date: 02/21/2018 CLINICAL DATA:  Fall.  Pain. EXAM: LEFT RING FINGER 2+V COMPARISON:  None.  FINDINGS: There is no evidence of fracture or dislocation. There is no evidence of arthropathy or other focal bone abnormality. Soft tissues are swollen. IMPRESSION: PIP joint soft tissue swelling.  No fracture or dislocation. Electronically Signed   By: Staci Righter M.D.   On: 02/21/2018 15:48   Dg Foot 2 Views Left  Result Date: 02/21/2018 CLINICAL DATA:  Left foot pain worst about the third and fourth toes due to a fall today. Initial encounter. EXAM: LEFT FOOT - 2 VIEW COMPARISON:  None. FINDINGS: There is no evidence of fracture or dislocation. There is no evidence of arthropathy or other focal bone abnormality. Soft tissues are unremarkable. IMPRESSION: Negative exam. Electronically Signed   By: Inge Rise M.D.   On: 02/21/2018  15:63    Assessment: 62 y.o. with PMH of hepatocellular carcinoma, liver cirrhosis, hepatitis C, alcoholism, presented to hospital after a fall at home, along with progressive weakness.  1. Progressive weakness and fall at home 2.hyponatremia  3.  Multifocal hepatocellular carcinoma 4. Ascites  5. Liver cirrhosis  6. Treated hep C 7. History of alcohol abuse  8. HTN  9.  Progressive weakness 10.  Anorexia 11. Fall at home and deconditioning    Plan:  -pt has been on first line systemic therapy Lenvatinib for his [facility] for 3-4 months, although his recent restaging scan showed some tumor regression in his liver, and no other metastatic disease, his overall condition has declined lately, with progressive weakness, anorexia, and frequent requirement for paracentesis. I think his overall declining is probable related to his liver cancer and cirrhosis  -I recently discussed adding Keytruda to his treatment, but he has not started yet.  Due to his clinical declining, I am not sure if he is able to continue treatment. -OK to hold Lenvatinib for now  -His hyponatremia is probably related to diuretics, dehydration, and worsening liver function.  He is receiving IV  hydration.  We will monitor his sodium level closely -He appears to be sluggish and slightly confused to me, although his ammonia level was normal today.  -He does not appear to be toxic or septic to me, infection work-up is ongoing  -If his mental status and overall condition does not improve after hydration, will discuss hospice with pt and his mother. He lives alone, has elderly parents, and limited social support  -I discussed code status with pt, and recommend DNR, he will think about it. -I will f/u him on Monday. Please call my on-call partner if needed during the weekend.    Truitt Merle, MD 02/21/2018  11:02 PM

## 2018-02-21 NOTE — ED Triage Notes (Signed)
The patient has had multiple falls over the course of the week with the last fall being today. Today the patient needed assistance in order to get up. The caregiver has noted increased weakness and decrease in PO intake and movement.

## 2018-02-21 NOTE — ED Provider Notes (Signed)
[facility] DEPT Provider Note   CSN: [id number] Arrival date & time: 02/21/18  1157     History   Chief Complaint Chief Complaint  Patient presents with  . Fall  . Weakness    HPI Joshua [name] is a 62 y.o. male.  HPI  Patient presents with concern of weakness, back pain and concern for effects of a fall. Patient has multiple medical issues including cancer, is currently taking oral chemotherapy. He notes that he has been in his usual state of health, tolerating therapy, but today, felt particularly weak, feeling swollen and onto the floor. No head trauma, no syncope, no new weakness in a focal distribution, but there has been more severe pain than usual in his lumbar spine, described as sore, severe, worse with ambulation or weightbearing. No medication taken for relief  Past Medical History:  Diagnosis Date  . Cancer ([place])   . Cataract   . Chronic alcoholism ([place])   . Cirrhosis ([place])   . Depression   . Glucose intolerance (impaired glucose tolerance)   . Hepatitis C   . Hypertension   . Tobacco use   . Umbilical hernia     Patient Active Problem List   Diagnosis Date Noted  . Decubitus ulcer of buttock, stage 2 ([place]) 02/05/2018  . Protein calorie malnutrition ([place]) 02/05/2018  . Hepatocellular carcinoma ([place]) 08/24/2016  . Chronic hepatitis C ([place]) 10/05/2014  . Ganglion cyst of wrist 12/30/2013  . ANXIETY DEPRESSION 05/25/2010  . COPD (chronic obstructive pulmonary disease) ([place]) 09/16/2009  . ESOPHAGEAL REFLUX 09/16/2009  . TESTICULAR MASS, LEFT 09/16/2009  . PAIN IN JOINT, MULTIPLE SITES 09/16/2009  . DIARRHEA 09/16/2009  . HEPATOMEGALY 09/16/2009  . CIRRHOSIS, ALCOHOLIC 19/37/9024  . ALCOHOL WITHDRAWAL 02/02/2009  . CONSTIPATION 02/02/2009  . ABDOMINAL PAIN, GENERALIZED 01/31/2009  . WEIGHT LOSS, ABNORMAL 01/26/2009  . ANXIETY DISORDER 03/12/2008  . Essential hypertension 10/31/2006    Past Surgical History:    Procedure Laterality Date  . CATARACT EXTRACTION    . COLONOSCOPY  12/09  . INGUINAL HERNIA REPAIR    . IR 3D INDEPENDENT WKST  09/13/2016  . IR [place] ADDITIONAL VESSEL  09/13/2016  . IR [place] ADDITIONAL VESSEL  09/13/2016  . IR [place] ADDITIONAL VESSEL  09/13/2016  . IR [place] ADDITIONAL VESSEL  09/13/2016  . IR [place] ADDITIONAL VESSEL  09/13/2016  . IR ANGIOGRAM SELECTIVE EACH ADDITIONAL VESSEL  06/27/2017  . IR ANGIOGRAM SELECTIVE EACH ADDITIONAL VESSEL  06/27/2017  . IR ANGIOGRAM SELECTIVE EACH ADDITIONAL VESSEL  06/27/2017  . IR ANGIOGRAM SELECTIVE EACH ADDITIONAL VESSEL  07/09/2017  . IR ANGIOGRAM SELECTIVE EACH ADDITIONAL VESSEL  07/09/2017  . IR ANGIOGRAM VISCERAL SELECTIVE  09/13/2016  . IR ANGIOGRAM VISCERAL SELECTIVE  06/27/2017  . IR ANGIOGRAM VISCERAL SELECTIVE  06/27/2017  . IR ANGIOGRAM VISCERAL SELECTIVE  07/09/2017  . IR EMBO TUMOR ORGAN ISCHEMIA INFARCT INC GUIDE ROADMAPPING  09/13/2016  . IR EMBO TUMOR ORGAN ISCHEMIA INFARCT INC GUIDE ROADMAPPING  07/09/2017  . IR PARACENTESIS  10/10/2017  . IR PARACENTESIS  10/28/2017  . IR PARACENTESIS  11/11/2017  . IR PARACENTESIS  11/21/2017  . IR PARACENTESIS  12/20/2017  . IR PARACENTESIS  01/10/2018  . IR PARACENTESIS  01/27/2018  . IR RADIOLOGIST EVAL & MGMT  08/21/2016  . IR RADIOLOGIST EVAL & MGMT  08/30/2016  . IR RADIOLOGIST EVAL & MGMT  09/26/2016  . IR RADIOLOGIST EVAL &  MGMT  01/10/2017  . IR RADIOLOGIST EVAL & MGMT  11/06/2016  . IR RADIOLOGIST EVAL & MGMT  05/16/2017  . IR RADIOLOGIST EVAL & MGMT  06/13/2017  . IR RADIOLOGIST EVAL & MGMT  08/01/2017  . IR RADIOLOGIST EVAL & MGMT  10/16/2017  . IR US GUIDE VASC ACCESS RIGHT  09/13/2016  . IR US GUIDE VASC ACCESS RIGHT  06/27/2017  . IR US GUIDE VASC ACCESS RIGHT  07/09/2017  . left orchiectomy  07/2009  . RADIOFREQUENCY ABLATION N/A 10/05/2016   Procedure: LIVER MICROWAVE THERMAL ABLATION;  Surgeon: Sandi Mariscal,  MD;  Location: [facility];  Service: Anesthesiology;  Laterality: N/A;  . UMBILICAL HERNIA REPAIR          Home Medications    Prior to Admission medications   Medication Sig Start Date End Date Taking? Authorizing Provider  acetaminophen (TYLENOL) 325 MG tablet Take 650 mg by mouth every 6 (six) hours as needed (for headaches.).    [provider]  b complex vitamins capsule Take 1 capsule by mouth daily.    [provider]  Baclofen 5 MG TABS Take 5-10 mg by mouth every 8 (eight) hours as needed. 02/13/18   Truitt Merle, MD  cephALEXin (KEFLEX) 500 MG capsule Take 1 capsule (500 mg total) by mouth 4 (four) times daily. 02/10/18   Burchette, Alinda Sierras, MD  furosemide (LASIX) 40 MG tablet Take 40 mg by mouth 2 (two) times daily.     [provider]  lactulose (CEPHULAC) 10 g packet Take 1 packet (10 g total) by mouth 3 (three) times daily. 01/08/18   Truitt Merle, MD  Lenvatinib 12 mg daily dose (LENVIMA) 3 x 4 MG capsule Take 12 mg by mouth daily. 01/24/18   Truitt Merle, MD  lisinopril-hydrochlorothiazide (PRINZIDE,ZESTORETIC) 10-12.5 MG tablet Take 1 tablet by mouth daily. 02/28/17   Marletta Lor, MD  Multiple Vitamin (MULTIVITAMIN WITH MINERALS) TABS tablet Take 1 tablet by mouth daily.    [provider]  prochlorperazine (COMPAZINE) 10 MG tablet TAKE 1 TABLET(10 MG) BY MOUTH EVERY 6 HOURS AS NEEDED FOR NAUSEA OR VOMITING 02/14/18   Truitt Merle, MD  spironolactone (ALDACTONE) 100 MG tablet Take 100 mg by mouth 2 (two) times daily.     [provider]  traZODone (DESYREL) 50 MG tablet Take 2 tablets (100 mg total) by mouth at bedtime. 01/21/18   Truitt Merle, MD  triamcinolone ointment (KENALOG) 0.5 % Apply 1 application topically 2 (two) times daily. 12/16/17   Truitt Merle, MD    Family History Family History  Problem Relation Age of Onset  . Healthy Mother   . Colon cancer Neg Hx   . Stomach cancer Neg Hx   . Esophageal cancer Neg Hx   . Rectal cancer Neg  Hx     Social History Social History   Tobacco Use  . Smoking status: Former Smoker    Packs/day: 0.50    Years: 42.00    Pack years: 21.00    Types: Cigarettes    Start date: 05/21/1971    Last attempt to quit: 07/28/2017    Years since quitting: 0.5  . Smokeless tobacco: Former Systems developer  . Tobacco comment: Has cut down to about 1/3 pack per day  Substance Use Topics  . Alcohol use: No    Alcohol/week: 28.0 standard drinks    Types: 28 Cans of beer per week    Comment: hx of nothing current   . Drug use:  No     Allergies   Patient has no known allergies.   Review of Systems Review of Systems  Constitutional:       Per HPI, otherwise negative  HENT:       Per HPI, otherwise negative  Respiratory:       Per HPI, otherwise negative  Cardiovascular:       Per HPI, otherwise negative  Gastrointestinal: Negative for vomiting.  Endocrine:       Negative aside from HPI  Genitourinary:       Neg aside from HPI   Musculoskeletal:       Per HPI, otherwise negative  Skin: Positive for wound.  Allergic/Immunologic: Positive for immunocompromised state.  Neurological: Positive for weakness. Negative for syncope.  Hematological: Bruises/bleeds easily.     Physical Exam Updated Vital Signs BP 117/87   Pulse (!) 103   Temp (!) 97.3 F (36.3 C) (Oral)   Resp 15   SpO2 97%   Physical Exam  Constitutional: He is oriented to person, place, and time. He appears well-developed. He appears cachectic. He has a sickly appearance. No distress.  HENT:  Head: Normocephalic and atraumatic.  Eyes: Conjunctivae and EOM are normal.  Cardiovascular: Normal rate and regular rhythm.  Pulmonary/Chest: Effort normal. No stridor. No respiratory distress.  Abdominal: He exhibits no distension.  Musculoskeletal: He exhibits no edema.  Neurological: He is alert and oriented to person, place, and time. He displays atrophy. No cranial nerve deficit.  Moves all tremors spontaneously, has  diffuse atrophy, but no focal deformities  Skin: Skin is warm and dry.  Multiple areas of ecchymosis, excoriation and skin breakdown throughout the habitus.  Psychiatric: He has a normal mood and affect.  Nursing note and vitals reviewed.    ED Treatments / Results  Labs (all labs ordered are listed, but only abnormal results are displayed) Labs Reviewed  COMPREHENSIVE METABOLIC PANEL - Abnormal; Notable for the following components:      Result Value   Sodium 117 (*)    Potassium 5.4 (*)    Chloride 78 (*)    Glucose, Bld 100 (*)    Creatinine, Ser 0.50 (*)    Calcium 8.4 (*)    Albumin 2.2 (*)    AST 98 (*)    ALT 48 (*)    Alkaline Phosphatase 310 (*)    Total Bilirubin 1.8 (*)    All other components within normal limits  BRAIN NATRIURETIC PEPTIDE - Abnormal; Notable for the following components:   B Natriuretic Peptide 153.6 (*)    All other components within normal limits  CBC WITH DIFFERENTIAL/PLATELET - Abnormal; Notable for the following components:   WBC 19.6 (*)    RDW 17.8 (*)    Neutro Abs 16.1 (*)    Lymphs Abs 0.5 (*)    Monocytes Absolute 2.8 (*)    Abs Immature Granulocytes 0.13 (*)    All other components within normal limits  ETHANOL  LIPASE, BLOOD  MAGNESIUM    EKG EKG Interpretation  Date/Time:  Friday February 21 2018 12:14:15 EDT Ventricular Rate:  105 PR Interval:    QRS Duration: 96 QT Interval:  364 QTC Calculation: 482 R Axis:   80 Text Interpretation:  Sinus tachycardia Atrial premature complex Low voltage with right axis deviation Abnormal R-wave progression, early transition T wave abnormality Abnormal ekg Confirmed by Carmin Muskrat [phone]) on 02/21/2018 12:41:03 PM   Radiology Dg Chest 2 View  Result Date: 02/21/2018 CLINICAL DATA:  62 year old male with multiple recent falls. Recent increased weakness. History of progressive hepatocellular carcinoma. EXAM: CHEST - 2 VIEW COMPARISON:  Chest CT 11/12/2017.  One-view chest  10/04/2016. FINDINGS: Semi upright AP and lateral views of the chest. Lower lung volumes. Allowing for portable technique the lungs are clear. Mediastinal contours remain normal. No pneumothorax or pleural effusion. No acute osseous abnormality identified. Paucity of bowel gas in the upper abdomen. IMPRESSION: No acute cardiopulmonary abnormality. Electronically Signed   By: Genevie Ann M.D.   On: 02/21/2018 13:29   Dg Lumbar Spine Complete  Result Date: 02/21/2018 CLINICAL DATA:  62 year old male with multiple recent falls. Increased weakness. Mid back pain radiating down the left leg. History of progressive HCC. EXAM: LUMBAR SPINE - COMPLETE 4+ VIEW COMPARISON:  CT Abdomen and Pelvis 06/17/2017. FINDINGS: Extensive Aortoiliac calcified atherosclerosis. Normal lumbar segmentation. Mild T12 superior endplate deformity appears stable since February. Stable lumbar vertebral height and alignment. No pars fracture. The sacral ala and SI joints appear intact. Chronic severe disc space loss at L2-L3 with vacuum disc and endplate spurring. Better preserved disc spaces elsewhere with intermittent endplate spurring. IMPRESSION: 1.  No acute osseous abnormality identified in the lumbar spine. 2. 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 3. Extensive aortic Atherosclerosis (ICD10-I70.0). Electronically Signed   By: Genevie Ann M.D.   On: 02/21/2018 13:32    Procedures Procedures (including critical care time)  Medications Ordered in ED Medications  sodium chloride 0.9 % bolus 1,000 mL (has no administration in time range)     Initial Impression / Assessment and Plan / ED Course  I have reviewed the triage vital signs and the nursing notes.  Pertinent labs & imaging results that were available during my care of the patient were reviewed by me and considered in my medical decision making (see chart for details).  After the initial evaluation I discussed patient's presentation with his parents,  father is a retired Engineer, drilling. The patient has been declining for some time. He started to most recent oral chemotherapy cage about 1 month ago.    2:09 PM Initial labs notable for sodium 117.  Patient in similar condition on repeat evaluation, appears weak in general, but oriented appropriately. Patient is receiving fluid resuscitation, will continue to require additional resuscitation given his critically abnormal sodium value.  This 62 year old male with history of hepatocellular carcinoma presents with weakness, and after a fall. No obvious fractures, deformity following fall, but the patient is found to have critically abnormal sodium value, likely contributing to his weakness. Patient required initiation of fluid resuscitation for hyponatremia, required admission for further evaluation and management.   Final Clinical Impressions(s) / ED Diagnoses  Weakness Hyponatremia Fall   CRITICAL CARE Performed by: Carmin Muskrat Total critical care time: 40 minutes Critical care time was exclusive of separately billable procedures and treating other patients. Critical care was necessary to treat or prevent imminent or life-threatening deterioration. Critical care was time spent personally by me on the following activities: development of treatment plan with patient and/or surrogate as well as nursing, discussions with consultants, evaluation of patient's response to treatment, examination of patient, obtaining history from patient or surrogate, ordering and performing treatments and interventions, ordering and review of laboratory studies, ordering and review of radiographic studies, pulse oximetry and re-evaluation of patient's condition.    Carmin Muskrat, MD 02/21/18 1410
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
## 2018-02-21 NOTE — Progress Notes (Signed)
CRITICAL VALUE ALERT  Critical Value:  Sodium 117  Date & Time Notied: 10/11   1342  Provider Notified: Vanita Panda MD  Orders Received/Actions taken: none at this time

## 2018-02-21 NOTE — H&P (Addendum)
History and Physical    Joshua [name]:[id number] DOB: 04-01-1956 DOA: 02/21/2018  PCP: Joshua Post, MD  Patient coming from: home  I have personally briefly reviewed patient's old medical records in [place]  Chief Complaint: fall  HPI: Joshua [name] is Joshua [name] 62 y.o. male with medical history significant of hepatocellular carcinoma, cirrhosis, alcoholism, hepatitis C, multiple other chronic medical problems presenting after Joshua [name] fall at home.  Patient states that he fell about 2 days ago when walking from the bathroom.  He notes that his walker slipped from under him.  He denies any head trauma or loss of consciousness.  He notes that his neighbor helped him get back onto the couch.  The patient is confused and states that he has been in the emergency department for 2 days.  On discussion with his father, he states that they found him on the ground in his home and were unsure how long he been there.  The patient estimates that he is had about 3 falls over the past month.  He denies any fevers, chest pain, shortness of breath, abdominal pain, numbness or tingling.  He notes generalized weakness and some pain in his lower back.  He denies any difficulty urinating or with bowel movements or any saddle anesthesia.  He does note decreased appetite.  He lives at home alone and uses Joshua [name] walker.  He does not cook or clean, but does note that he is able to bathe himself and dress himself, but that it is challenging.  ED Course: Labs, imaging, IVF.  Admit for hyponatremia.  Review of Systems: As per HPI otherwise 10 point review of systems negative.   Past Medical History:  Diagnosis Date  . Cancer ([place])   . Cataract   . Chronic alcoholism ([place])   . Cirrhosis ([place])   . Depression   . Glucose intolerance (impaired glucose tolerance)   . Hepatitis C   . Hypertension   . Tobacco use   . Umbilical hernia     Past Surgical History:  Procedure Laterality Date  . CATARACT EXTRACTION      . COLONOSCOPY  12/09  . INGUINAL HERNIA REPAIR    . IR 3D INDEPENDENT WKST  09/13/2016  . IR [place] ADDITIONAL VESSEL  09/13/2016  . IR [place] ADDITIONAL VESSEL  09/13/2016  . IR [place] ADDITIONAL VESSEL  09/13/2016  . IR [place] ADDITIONAL VESSEL  09/13/2016  . IR [place] ADDITIONAL VESSEL  09/13/2016  . IR ANGIOGRAM SELECTIVE EACH ADDITIONAL VESSEL  06/27/2017  . IR ANGIOGRAM SELECTIVE EACH ADDITIONAL VESSEL  06/27/2017  . IR ANGIOGRAM SELECTIVE EACH ADDITIONAL VESSEL  06/27/2017  . IR ANGIOGRAM SELECTIVE EACH ADDITIONAL VESSEL  07/09/2017  . IR ANGIOGRAM SELECTIVE EACH ADDITIONAL VESSEL  07/09/2017  . IR ANGIOGRAM VISCERAL SELECTIVE  09/13/2016  . IR ANGIOGRAM VISCERAL SELECTIVE  06/27/2017  . IR ANGIOGRAM VISCERAL SELECTIVE  06/27/2017  . IR ANGIOGRAM VISCERAL SELECTIVE  07/09/2017  . IR EMBO TUMOR ORGAN ISCHEMIA INFARCT INC GUIDE ROADMAPPING  09/13/2016  . IR EMBO TUMOR ORGAN ISCHEMIA INFARCT INC GUIDE ROADMAPPING  07/09/2017  . IR PARACENTESIS  10/10/2017  . IR PARACENTESIS  10/28/2017  . IR PARACENTESIS  11/11/2017  . IR PARACENTESIS  11/21/2017  . IR PARACENTESIS  12/20/2017  . IR PARACENTESIS  01/10/2018  . IR PARACENTESIS  01/27/2018  . IR RADIOLOGIST EVAL & MGMT  08/21/2016  . IR RADIOLOGIST EVAL &  MGMT  08/30/2016  . IR RADIOLOGIST EVAL & MGMT  09/26/2016  . IR RADIOLOGIST EVAL & MGMT  01/10/2017  . IR RADIOLOGIST EVAL & MGMT  11/06/2016  . IR RADIOLOGIST EVAL & MGMT  05/16/2017  . IR RADIOLOGIST EVAL & MGMT  06/13/2017  . IR RADIOLOGIST EVAL & MGMT  08/01/2017  . IR RADIOLOGIST EVAL & MGMT  10/16/2017  . IR US GUIDE VASC ACCESS RIGHT  09/13/2016  . IR US GUIDE VASC ACCESS RIGHT  06/27/2017  . IR US GUIDE VASC ACCESS RIGHT  07/09/2017  . left orchiectomy  07/2009  . RADIOFREQUENCY ABLATION N/Joshua [name] 10/05/2016   Procedure: LIVER MICROWAVE THERMAL ABLATION;  Surgeon: Joshua Mariscal, MD;  Location: [facility];  Service: Anesthesiology;   Laterality: N/Joshua [name];  . UMBILICAL HERNIA REPAIR       reports that he quit smoking about 6 months ago. His smoking use included cigarettes. He started smoking about 46 years ago. He has Joshua [name] 21.00 pack-year smoking history. He has quit using smokeless tobacco. He reports that he does not drink alcohol or use drugs.  No Known Allergies  Family History  Problem Relation Age of Onset  . Healthy Mother   . Colon cancer Neg Hx   . Stomach cancer Neg Hx   . Esophageal cancer Neg Hx   . Rectal cancer Neg Hx    Prior to Admission medications   Medication Sig Start Date End Date Taking? Authorizing Provider  acetaminophen (TYLENOL) 325 MG tablet Take 650 mg by mouth every 6 (six) hours as needed (for headaches.).   Yes [provider]  b complex vitamins capsule Take 1 capsule by mouth daily.   Yes [provider]  Baclofen 5 MG TABS Take 5-10 mg by mouth every 8 (eight) hours as needed. Patient taking differently: Take 5-10 mg by mouth every 8 (eight) hours as needed (muscle spasm).  02/13/18  Yes Truitt Merle, MD  cephALEXin (KEFLEX) 500 MG capsule Take 1 capsule (500 mg total) by mouth 4 (four) times daily. 02/10/18  Yes Burchette, Alinda Sierras, MD  furosemide (LASIX) 40 MG tablet Take 40 mg by mouth 2 (two) times daily. Take doses 6hr apart   Yes [provider]  lactulose (CEPHULAC) 10 g packet Take 1 packet (10 g total) by mouth 3 (three) times daily. 01/08/18  Yes Truitt Merle, MD  Lenvatinib 12 mg daily dose (LENVIMA) 3 x 4 MG capsule Take 12 mg by mouth daily. 01/24/18  Yes Truitt Merle, MD  Multiple Vitamin (MULTIVITAMIN WITH MINERALS) TABS tablet Take 1 tablet by mouth daily.   Yes [provider]  prochlorperazine (COMPAZINE) 10 MG tablet TAKE 1 TABLET(10 MG) BY MOUTH EVERY 6 HOURS AS NEEDED FOR NAUSEA OR VOMITING Patient taking differently: Take 10 mg by mouth every 6 (six) hours as needed for nausea or vomiting.  02/14/18  Yes Truitt Merle, MD  spironolactone (ALDACTONE) 100  MG tablet Take 100 mg by mouth 2 (two) times daily.    Yes [provider]  traZODone (DESYREL) 50 MG tablet Take 2 tablets (100 mg total) by mouth at bedtime. 01/21/18  Yes Truitt Merle, MD  triamcinolone ointment (KENALOG) 0.5 % Apply 1 application topically 2 (two) times daily. 12/16/17  Yes Truitt Merle, MD  lisinopril-hydrochlorothiazide (PRINZIDE,ZESTORETIC) 10-12.5 MG tablet Take 1 tablet by mouth daily. Patient not taking: Reported on 02/21/2018 02/28/17   Marletta Lor, MD    Physical Exam: Vitals:   02/21/18 1430 02/21/18 1500 02/21/18 1516 02/21/18  1600  BP: (!) 103/92 134/89 134/89   Pulse: 94 93 93   Resp: '15 15 18   '$ Temp:    97.6 F (36.4 C)  TempSrc:    Oral  SpO2: 96% 100% 93%   Weight:      Height:        Constitutional: NAD, calm, comfortable Vitals:   02/21/18 1430 02/21/18 1500 02/21/18 1516 02/21/18 1600  BP: (!) 103/92 134/89 134/89   Pulse: 94 93 93   Resp: '15 15 18   '$ Temp:    97.6 F (36.4 C)  TempSrc:    Oral  SpO2: 96% 100% 93%   Weight:      Height:       Eyes: PERRL, lids and conjunctivae normal ENMT: Mucous membranes are moist. Posterior pharynx clear of any exudate or lesions.Normal dentition.  Neck: normal, supple, no masses, no thyromegaly Respiratory: clear to auscultation bilaterally, no wheezing, no crackles. Normal respiratory effort. No accessory muscle use.  Cardiovascular: Regular rate and rhythm, no murmurs / rubs / gallops. No extremity edema. 2+ pedal pulses. No carotid bruits.  Abdomen: no tenderness, no masses palpated. No hepatosplenomegaly. Bowel sounds positive.  Musculoskeletal: no clubbing / cyanosis. No joint deformity upper and lower extremities. Good ROM, no contractures. Normal muscle tone.  Skin: unstageable decubitus ulcer.  Ulcer to L ring finger and L 3rd and 4th toes.  Spider angiomata.   Neurologic: CN 2-12 intact. Sensation intact. Strength 5/5 in all 4.  Psychiatric: Normal judgment and insight. Alert and  oriented x 3. Normal mood.   Labs on Admission: I have personally reviewed following labs and imaging studies  CBC: Recent Labs  Lab 02/21/18 1236  WBC 19.6*  NEUTROABS 16.1*  HGB 15.6  HCT 45.0  MCV 91.3  PLT 564   Basic Metabolic Panel: Recent Labs  Lab 02/21/18 1236  NA 117*  K 5.4*  CL 78*  CO2 24  GLUCOSE 100*  BUN 17  CREATININE 0.50*  CALCIUM 8.4*  MG 2.0   GFR: Estimated Creatinine Clearance: 75.4 mL/min (Tigran Haynie) (by C-G formula based on SCr of 0.5 mg/dL (L)). Liver Function Tests: Recent Labs  Lab 02/21/18 1236  AST 98*  ALT 48*  ALKPHOS 310*  BILITOT 1.8*  PROT 6.9  ALBUMIN 2.2*   Recent Labs  Lab 02/21/18 1236  LIPASE 30   No results for input(s): AMMONIA in the last 168 hours. Coagulation Profile: Recent Labs  Lab 02/21/18 1500  INR 1.21   Cardiac Enzymes: No results for input(s): CKTOTAL, CKMB, CKMBINDEX, TROPONINI in the last 168 hours. BNP (last 3 results) No results for input(s): PROBNP in the last 8760 hours. HbA1C: No results for input(s): HGBA1C in the last 72 hours. CBG: No results for input(s): GLUCAP in the last 168 hours. Lipid Profile: No results for input(s): CHOL, HDL, LDLCALC, TRIG, CHOLHDL, LDLDIRECT in the last 72 hours. Thyroid Function Tests: No results for input(s): TSH, T4TOTAL, FREET4, T3FREE, THYROIDAB in the last 72 hours. Anemia Panel: No results for input(s): VITAMINB12, FOLATE, FERRITIN, TIBC, IRON, RETICCTPCT in the last 72 hours. Urine analysis:    Component Value Date/Time   PROTEINUR <30 ([place]) 11/25/2017 1021    Radiological Exams on Admission: Dg Chest 2 View  Result Date: 02/21/2018 CLINICAL DATA:  62 year old male with multiple recent falls. Recent increased weakness. History of progressive hepatocellular carcinoma. EXAM: CHEST - 2 VIEW COMPARISON:  Chest CT 11/12/2017.  One-view chest 10/04/2016. FINDINGS: Semi upright AP and lateral views of  the chest. Lower lung volumes. Allowing for portable  technique the lungs are clear. Mediastinal contours remain normal. No pneumothorax or pleural effusion. No acute osseous abnormality identified. Paucity of bowel gas in the upper abdomen. IMPRESSION: No acute cardiopulmonary abnormality. Electronically Signed   By: Genevie Ann M.D.   On: 02/21/2018 13:29   Dg Lumbar Spine Complete  Result Date: 02/21/2018 CLINICAL DATA:  62 year old male with multiple recent falls. Increased weakness. Mid back pain radiating down the left leg. History of progressive HCC. EXAM: LUMBAR SPINE - COMPLETE 4+ VIEW COMPARISON:  CT Abdomen and Pelvis 06/17/2017. FINDINGS: Extensive Aortoiliac calcified atherosclerosis. Normal lumbar segmentation. Mild T12 superior endplate deformity appears stable since February. Stable lumbar vertebral height and alignment. No pars fracture. The sacral ala and SI joints appear intact. Chronic severe disc space loss at L2-L3 with vacuum disc and endplate spurring. Better preserved disc spaces elsewhere with intermittent endplate spurring. IMPRESSION: 1.  No acute osseous abnormality identified in the lumbar spine. 2. 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 3. Extensive aortic Atherosclerosis (ICD10-I70.0). Electronically Signed   By: Genevie Ann M.D.   On: 02/21/2018 13:32   Dg Finger Ring Left  Result Date: 02/21/2018 CLINICAL DATA:  Fall.  Pain. EXAM: LEFT RING FINGER 2+V COMPARISON:  None. FINDINGS: There is no evidence of fracture or dislocation. There is no evidence of arthropathy or other focal bone abnormality. Soft tissues are swollen. IMPRESSION: PIP joint soft tissue swelling.  No fracture or dislocation. Electronically Signed   By: Staci Righter M.D.   On: 02/21/2018 15:48   Dg Foot 2 Views Left  Result Date: 02/21/2018 CLINICAL DATA:  Left foot pain worst about the third and fourth toes due to Tearsa Kowalewski fall today. Initial encounter. EXAM: LEFT FOOT - 2 VIEW COMPARISON:  None. FINDINGS: There is no evidence of fracture or  dislocation. There is no evidence of arthropathy or other focal bone abnormality. Soft tissues are unremarkable. IMPRESSION: Negative exam. Electronically Signed   By: Inge Rise M.D.   On: 02/21/2018 15:53    EKG: Independently reviewed. Sinus tachycardia.  T wave inversions in I, aVL and V2.  Assessment/Plan Active Problems:   Hyponatremia   Pressure injury of skin  Hyponatremia: Suspect this is 2/2 hypovolemia.  He's on lasix and spironolactone for his cirrhosis and also had thiazide on his med list (looks like this was supposed to be discontinued, but he stated to me he was still taking this).  Na 117 at presentation, down from 124 about 1 week ago (gradually worsening over past few months). Urine osms, urine Na, urine urea nitrogen, serum osm S/p 1 L NS in ED Sodium has bumped from 117 -> 123 with bolus and initial IVF, will d/c MIVF and follow how he does overnight Hold lasix and spironolactone for now, follow volume status.  Ensure he discontinues thiazide diuretic at discharge. Avoid overcorrection, follow q4 hr BMP for now and adjust above as needed  Leukocytosis: Pt has several potential sites of infection.  He has ulcer to L 4th finger as well as ulcers to L foot at his 3rd and 4th toes.  The toes were foul smelling and appeared infected (especially the 4th toe).  His abdomen was soft and nontender, I have low suspicion for SBP.  His sacral decub has some mild redness surrounding it, which could represent mild cellulitis.  CXR without acute abnormality.  He's hemodynamically stable.  He has palpable pulses.  No si/sx of sepsis. Plain  films notable for PIP joint soft tissue swelling in hand, foot plain films unremarkable Follow ESR/CRP Follow blood cultures Started on augmentin/doxy (was previously on keflex for his L toe wounds) Will follow up MRI of L foot  Fall: on pt's description, fall sounds mechanical with walker slipping from underneath him.  He's felt weaker recently.   C/o pain to L spine.  L spine plain film without acute findings.  No focal neurologic deficits on exam.  Pt living at home alone and family feels he needs higher level of care. PT eval Consider MRI of L spine if no improvement  Acute Encephalopathy: pt with some confusion in his recounting of the history, but Joshua [name] otherwise.  ? Some hepatic encephalopathy or contribution from hyponatremia. Continue lactulose, follow ammonia Consider additional work up as warranted   Hepatocellular Carcinoma: followed by Dr. Burr Medico.  On lenvatinib.  Planning for addition of keytruda.  Holding lenvatinib for now while inpatient.  Dr. Burr Medico is planning to see him later today or tomorrow.  Cirrhosis: 2/2 etoh and hepatitis C.  C/b recurrent ascites which has required frequent paracentesis.  Sees Dr. Loletha Carrow as outpatient. Lasix/aldactone on hold 2/2 above Continue lactulose  Hypertension: Holding lasix, spironolactone.  Was instructed to stop prinzide, but it sounds like he may have still been taking this  Decubitus Ulcer  LE Wounds  L ring finger ulcer: wound c/s, appreciate recs  Insomnia: continue trazodone for sleep  Hyperkalemia: mild, follow   Abnormal EKG: pt without cardiac symptoms, continue to follow  DVT prophylaxis: lovenox  Code Status: full  Family Communication: discussed with Dr. Luetta Nutting over phone  Disposition Plan: pending improvement  Consults called: Burr Medico, oncology Admission status: stepdown, pt with hyponatremia which will require several days as an inpatient to gradually correct.     Fayrene Helper MD [facility] Pager [phone]   If 7PM-7AM, please contact night-coverage [email or web address] Password [facility]  02/21/2018, 5:23 PM

## 2018-02-21 NOTE — Telephone Encounter (Signed)
Incoming call from Dr. Raeford Razor who is the Step Father of the patient.  States that the came over to patients house found him on the floor.  States tha he has been laying on the floor for about 20 to 25 minutes.  Patient has history of Ca of the liver.  Lives alone.  Dr. Luetta Nutting states that the patient is not safe to be alone . Unable to care for himself.  Would like to speak with Dr. Elease Hashimoto.  He may be reached at.  [phone].  Dr. Luetta Nutting states that patient is ok from today's fall. Request that patient be considered for a nursing home.   They are expecting a visiting nurse to come in today within the hour. This would be the first time a nurse comes out.  Stepfather is very concerned about the patient safety.  States " He needs to be in a nursing home. He is just tired. " Dr Raeford Razor request a return call from Dr. Elease Hashimoto.  Reason for Disposition . [1] MODERATE weakness (i.e., interferes with work, school, normal activities) AND [2] persists > 3 days . Sounds like a life-threatening emergency to the triager  Answer Assessment - Initial Assessment Questions 1. DESCRIPTION: "Describe how you are feeling."     Patient is weak  2. SEVERITY: "How bad is it?"  "Can you stand and walk?"   - MILD - Feels weak or tired, but does not interfere with work, school or normal activities   - [place] to stand and walk; weakness interferes with work, school, or normal activities   - SEVERE - Unable to stand or walk    severe 3. ONSET:  "When did the weakness begin?"      4. CAUSE: "What do you think is causing the weakness?"     CA of the liver 5. MEDICINES: "Have you recently started a new medicine or had a change in the amount of a medicine?"     no 6. OTHER SYMPTOMS: "Do you have any other symptoms?" (e.g., chest pain, fever, cough, SOB, vomiting, diarrhea, bleeding, other areas of pain)     Denies  7. PREGNANCY: "Is there any chance you are pregnant?" "When was your last menstrual  period?"     na  Protocols used: WEAKNESS (GENERALIZED) AND FATIGUE-A-AH

## 2018-02-21 NOTE — Telephone Encounter (Signed)
I left a VM on pt's mother's phone yesterday, no call back yet. I noticed that pt is in [facility] ED today and will likely to be admitted for hyponatremia, weakness and fall. I will f/u in the hospital.    Truitt Merle MD

## 2018-02-22 ENCOUNTER — Inpatient Hospital Stay (HOSPITAL_COMMUNITY): Payer: BLUE CROSS/BLUE SHIELD

## 2018-02-22 LAB — BASIC METABOLIC PANEL
Anion gap: 13 (ref 5–15)
Anion gap: 12 (ref 5–15)
BUN: 13 mg/dL (ref 8–23)
BUN: 12 mg/dL (ref 8–23)
CHLORIDE: 85 mmol/L — AB (ref 98–111)
CHLORIDE: 88 mmol/L — AB (ref 98–111)
CO2: 20 mmol/L — AB (ref 22–32)
CO2: 20 mmol/L — AB (ref 22–32)
Calcium: 7.5 mg/dL — ABNORMAL LOW (ref 8.9–10.3)
Calcium: 8 mg/dL — ABNORMAL LOW (ref 8.9–10.3)
Creatinine, Ser: 0.33 mg/dL — ABNORMAL LOW (ref 0.61–1.24)
Creatinine, Ser: 0.43 mg/dL — ABNORMAL LOW (ref 0.61–1.24)
GFR calc Af Amer: >60 mL/min (ref >60)
GFR calc Af Amer: >60 mL/min (ref >60)
GFR calc non Af Amer: >60 mL/min (ref >60)
GFR calc non Af Amer: >60 mL/min (ref >60)
GLUCOSE: 96 mg/dL (ref 70–99)
Glucose, Bld: 91 mg/dL (ref 70–99)
POTASSIUM: 5.5 mmol/L — AB (ref 3.5–5.1)
POTASSIUM: 4.7 mmol/L (ref 3.5–5.1)
Sodium: 118 mmol/L — CL (ref 135–145)
Sodium: 120 mmol/L — ABNORMAL LOW (ref 135–145)

## 2018-02-22 LAB — COMPREHENSIVE METABOLIC PANEL
ALBUMIN: 1.8 g/dL — AB (ref 3.5–5.0)
ALK PHOS: 270 U/L — AB (ref 38–126)
ALT: 43 U/L (ref 0–44)
AST: 78 U/L — AB (ref 15–41)
Anion gap: 10 (ref 5–15)
BUN: 13 mg/dL (ref 8–23)
CALCIUM: 8 mg/dL — AB (ref 8.9–10.3)
CO2: 22 mmol/L (ref 22–32)
CREATININE: 0.44 mg/dL — AB (ref 0.61–1.24)
Chloride: 86 mmol/L — ABNORMAL LOW (ref 98–111)
GFR calc Af Amer: >60 mL/min (ref >60)
GFR calc non Af Amer: >60 mL/min (ref >60)
GLUCOSE: 96 mg/dL (ref 70–99)
Potassium: 4.6 mmol/L (ref 3.5–5.1)
SODIUM: 118 mmol/L — AB (ref 135–145)
Total Bilirubin: 1.6 mg/dL — ABNORMAL HIGH (ref 0.3–1.2)
Total Protein: 5.6 g/dL — ABNORMAL LOW (ref 6.5–8.1)

## 2018-02-22 LAB — CBC
HCT: 39.5 % (ref 39.0–52.0)
Hemoglobin: 13.5 g/dL (ref 13.0–17.0)
MCH: 31.6 pg (ref 26.0–34.0)
MCHC: 34.2 g/dL (ref 30.0–36.0)
MCV: 92.5 fL (ref 80.0–100.0)
PLATELETS: 202 10*3/uL (ref 150–400)
RBC: 4.27 MIL/uL (ref 4.22–5.81)
RDW: 18.3 % — AB (ref 11.5–15.5)
WBC: 16.1 10*3/uL — ABNORMAL HIGH (ref 4.0–10.5)
nRBC: 0 % (ref 0.0–0.2)

## 2018-02-22 LAB — UREA NITROGEN, URINE: Urea Nitrogen, Ur: 249 mg/dL

## 2018-02-22 LAB — HIV ANTIBODY (ROUTINE TESTING W REFLEX): HIV Screen 4th Generation wRfx: NONREACTIVE

## 2018-02-22 MED ORDER — SODIUM CHLORIDE 0.9 % IV SOLN: INTRAVENOUS | Status: AC

## 2018-02-22 MED ORDER — SODIUM CHLORIDE 0.9 % IV SOLN
INTRAVENOUS | Status: DC
  Administered 2018-02-22: 04:00:00 via INTRAVENOUS

## 2018-02-22 MED ORDER — TRIAMCINOLONE ACETONIDE 0.5 % EX OINT
1.0000 "application " | TOPICAL_OINTMENT | Freq: Two times a day (BID) | CUTANEOUS | Status: DC
  Administered 2018-02-22 – 2018-02-23 (×5): 1 via TOPICAL
  Filled 2018-02-22 (×2): qty 15

## 2018-02-22 MED ORDER — ACETAMINOPHEN 500 MG PO TABS
500.0000 mg | ORAL_TABLET | ORAL | Status: DC | PRN
  Administered 2018-02-22 – 2018-02-24 (×4): 500 mg via ORAL
  Filled 2018-02-22 (×5): qty 1

## 2018-02-22 MED ORDER — SODIUM CHLORIDE 0.9 % IV SOLN
INTRAVENOUS | Status: AC
  Administered 2018-02-22 (×2): via INTRAVENOUS

## 2018-02-22 NOTE — Progress Notes (Signed)
[facility] PROGRESS NOTE  Joshua [name] KXF:[id number] DOB: Dec 26, 1955 DOA: 02/21/2018 PCP: [name], MD   Narrative: 62 year old male diagnosed with hepatocellular CA 06/18/2016 stage II progressed and liver-restaging MRI 10/16/2017 unfortunately showed disease progression was started on first-line lenvatinib and has had paracentesis every 10 daily--he also has liver cirrhosis secondary to hep C and alcoholism Multiple lung nodules Chronic EtOH, TOB Untreated hep C HTN on diuretics Presbycusis left ear  Presented to the ED 10/11 with fall 2 days prior to admission-he was confused (note he had been started on baclofen by oncologist recently) and had an unclear history of being found down per his father Dr. He is unable to perform IADLs  Found to have a sodium of 117 (reportedly still taking diuretics despite being told not to), leukocytosis with an ulcer on left fourth finger, left foot third and fourth toe ulcerations in addition-also found to be encephalopathic etc.  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 Luetta Nutting   Disposition Plan: [name], MD  [facility] Direct contact: [phone] --Via amion app OR  --[email or web address]; password [email or web address]  7PM-7AM contact night  coverage as above 02/22/2018, 7:34 AM  LOS: 1 day   Consultants:  Oncology Dr. Annamaria Boots  Procedures:  X-rays pending  Antimicrobials:  Doxycycline/Augmentin  Interval history/Subjective: Awake alert in nad Eating some No pain except in the LLE  Objective:  Vitals:  Vitals:   02/22/18 0500 02/22/18 0600  BP: 95/71 110/78  Pulse: 97 100  Resp: (!) 25 (!) 24  Temp:    SpO2: 95% 96%    Exam:  . Awake older than stated age . Bruising to chest and arm . cta b . abd soft but distended and slight fluid shift . Weak overall power grossly 5/5 . TOe ulceration   I have personally reviewed the following:   Labs:  Sodium 118 chloride 86  Calcium 8.0 albumin 1.8  AST/ALT 78/43  WBC down from 19.3-16.1  Sed rate 26    Imaging studies:  X-ray of back shows advanced chronic disc endplate degeneration F7-P1  X-ray left ring finger shows no evidence of fracture PIP soft tissue joint swelling  X-ray of left foot shows negative exam  MRI left foot is pending  Medical tests:  Multiple  Test discussed with performing physician:  no  Decision to obtain old records:  Yes  Review and summation of old records:  Yes  Scheduled Meds: . amoxicillin-clavulanate  1 tablet Oral Q12H  . B-complex with vitamin C  1 tablet Oral Daily  . doxycycline  100 mg Oral Q12H  . enoxaparin (LOVENOX) injection  40 mg Subcutaneous Q24H  . feeding supplement (ENSURE ENLIVE)  237 mL Oral BID BM  . lactulose  10 g Oral TID  . multivitamin with minerals  1 tablet Oral Daily  . traZODone  100 mg Oral QHS  . triamcinolone ointment  1 application Topical BID   Continuous Infusions: . sodium chloride 75 mL/hr at 02/22/18 0600    Active Problems:   Hyponatremia   Pressure injury of skin   Fall   LOS: 1 day

## 2018-02-22 NOTE — Evaluation (Signed)
Physical Therapy Evaluation Patient Details Name: Joshua [name] MRN: [id number] DOB: [birth year] Today's Date: 02/22/2018   History of Present Illness  62 y.o. male with medical history significant of hepatocellular carcinoma, cirrhosis, alcoholism, hepatitis C, multiple other chronic medical problems presenting after a fall at home and diagnosed with toxic metabolic encephalopathy related either to liver cirrhosis versus metastatic liver disease from untreated hep C  Clinical Impression  Pt admitted with above diagnosis. Pt currently with functional limitations due to the deficits listed below (see PT Problem List).  Pt will benefit from skilled PT to increase their independence and safety with mobility to allow discharge to the venue listed below.   Pt reports being mostly in bed for the past week, and so he states he feels very deconditioned and weak.  Pt assisted to standing at bedside however had difficulty lifting feet and shifting weight so did not yet attempt ambulation.  Pt with multiple areas of wounds including sacral pressure injury which pt reports he was told to use donut cushion to sit on at home.  Pt educated to change positions every 2 hours for pressure injury prevention.  Recommend d/c to SNF at this time.     Follow Up Recommendations SNF    Equipment Recommendations  None recommended by PT    Recommendations for Other Services       Precautions / Restrictions Precautions Precautions: Fall Precaution Comments: fragile skin      Mobility  Bed Mobility Overal bed mobility: Needs Assistance Bed Mobility: Supine to Sit;Sit to Sidelying     Supine to sit: Mod assist   Sit to sidelying: Mod assist General bed mobility comments: assist for trunk upright, assist for LEs onto bed  Transfers Overall transfer level: Needs assistance Equipment used: Rolling walker (2 wheeled) Transfers: Sit to/from Stand Sit to Stand: Min assist;From elevated surface          General transfer comment: assist to rise and steady as well as control descent, pt with difficulty marching in place so did not attempt ambulation today, pt shifted feet up [facility]  Ambulation/Gait                Stairs            Wheelchair Mobility    Modified Rankin (Stroke Patients Only)       Balance Overall balance assessment: Needs assistance;History of Falls         Standing balance support: Bilateral upper extremity supported Standing balance-Leahy Scale: Poor                               Pertinent Vitals/Pain Pain Assessment: 0-10 Pain Score: 7  Pain Location: L foot Pain Descriptors / Indicators: Tender;Constant;Discomfort Pain Intervention(s): Limited activity within patient's tolerance;Repositioned;Monitored during session    [place] expects to be discharged to:: Private residence Living Arrangements: Alone   Type of Home: House       Home Layout: One level Home Equipment: Environmental consultant - 2 wheels      Prior Function Level of Independence: Independent with assistive device(s)         Comments: reports he was using a RW in the morning for balance, from home alone     Hand Dominance        Extremity/Trunk Assessment   Upper Extremity Assessment Upper Extremity Assessment: Generalized weakness    Lower Extremity Assessment Lower Extremity Assessment: Generalized weakness  Cervical / Trunk Assessment Cervical / Trunk Assessment: (cachectic appearance)  Communication   Communication: No difficulties  Cognition Arousal/Alertness: Awake/alert Behavior During Therapy: WFL for tasks assessed/performed Overall Cognitive Status: Within Functional Limits for tasks assessed                                 General Comments: following commands/cues, appropriate for session      General Comments      Exercises     Assessment/Plan    PT Assessment Patient needs continued PT services   PT Problem List Decreased strength;Decreased mobility;Decreased skin integrity;Decreased balance;Decreased knowledge of use of DME;Decreased activity tolerance       PT Treatment Interventions DME instruction;Therapeutic activities;Gait training;Therapeutic exercise;Patient/family education;Functional mobility training;Balance training    PT Goals (Current goals can be found in the Care Plan section)  Acute Rehab PT Goals PT Goal Formulation: With patient Time For Goal Achievement: 03/08/18 Potential to Achieve Goals: Good    Frequency Min 3X/week   Barriers to discharge        Co-evaluation               AM-PAC PT "6 Clicks" Daily Activity  Outcome Measure Difficulty turning over in bed (including adjusting bedclothes, sheets and blankets)?: Unable Difficulty moving from lying on back to sitting on the side of the bed? : Unable Difficulty sitting down on and standing up from a chair with arms (e.g., wheelchair, bedside commode, etc,.)?: Unable Help needed moving to and from a bed to chair (including a wheelchair)?: A Lot Help needed walking in hospital room?: Total Help needed climbing 3-5 steps with a railing? : Total 6 Click Score: 7    End of Session Equipment Utilized During Treatment: Gait belt Activity Tolerance: Patient tolerated treatment well Patient left: in bed;with bed alarm set;with call bell/phone within reach Nurse Communication: Mobility status PT Visit Diagnosis: Difficulty in walking, not elsewhere classified (R26.2);Muscle weakness (generalized) (M62.81)    Time: 1937-9024 PT Time Calculation (min) (ACUTE ONLY): 23 min   Charges:   PT Evaluation $PT Eval Moderate Complexity: [place], PT, DPT Acute Rehabilitation Services Office: [phone] Pager: [phone]   Trena Platt 02/22/2018, 12:47 PM

## 2018-02-22 NOTE — Progress Notes (Signed)
CRITICAL VALUE ALERT  Critical Value:  Sodium reading of 118  Date & Time Notied:  02/22/18 02:55  Provider Notified: Dr. Posey Pronto  Orders Received/Actions taken: Sodium Chloride infusion started at 75 mL/hr.

## 2018-02-23 LAB — COMPREHENSIVE METABOLIC PANEL
ALT: 41 U/L (ref 0–44)
ANION GAP: 7 (ref 5–15)
AST: 74 U/L — ABNORMAL HIGH (ref 15–41)
Albumin: 1.6 g/dL — ABNORMAL LOW (ref 3.5–5.0)
Alkaline Phosphatase: 319 U/L — ABNORMAL HIGH (ref 38–126)
BILIRUBIN TOTAL: 1.4 mg/dL — AB (ref 0.3–1.2)
BUN: 13 mg/dL (ref 8–23)
CO2: 22 mmol/L (ref 22–32)
Calcium: 7.8 mg/dL — ABNORMAL LOW (ref 8.9–10.3)
Chloride: 92 mmol/L — ABNORMAL LOW (ref 98–111)
Creatinine, Ser: 0.4 mg/dL — ABNORMAL LOW (ref 0.61–1.24)
Glucose, Bld: 107 mg/dL — ABNORMAL HIGH (ref 70–99)
POTASSIUM: 4.4 mmol/L (ref 3.5–5.1)
Sodium: 121 mmol/L — ABNORMAL LOW (ref 135–145)
TOTAL PROTEIN: 5.3 g/dL — AB (ref 6.5–8.1)

## 2018-02-23 LAB — CBC WITH DIFFERENTIAL/PLATELET
ABS IMMATURE GRANULOCYTES: 0.14 10*3/uL — AB (ref 0.00–0.07)
Basophils Absolute: 0 10*3/uL (ref 0.0–0.1)
Basophils Relative: 0 %
Eosinophils Absolute: 0 10*3/uL (ref 0.0–0.5)
Eosinophils Relative: 0 %
HCT: 38.1 % — ABNORMAL LOW (ref 39.0–52.0)
Hemoglobin: 13.1 g/dL (ref 13.0–17.0)
Immature Granulocytes: 1 %
Lymphocytes Relative: 3 %
Lymphs Abs: 0.5 10*3/uL — ABNORMAL LOW (ref 0.7–4.0)
MCH: 31.8 pg (ref 26.0–34.0)
MCHC: 34.4 g/dL (ref 30.0–36.0)
MCV: 92.5 fL (ref 80.0–100.0)
MONO ABS: 2.5 10*3/uL — AB (ref 0.1–1.0)
Monocytes Relative: 17 %
NEUTROS ABS: 11.7 10*3/uL — AB (ref 1.7–7.7)
Neutrophils Relative %: 79 %
PLATELETS: 191 10*3/uL (ref 150–400)
RBC: 4.12 MIL/uL — ABNORMAL LOW (ref 4.22–5.81)
RDW: 18.6 % — ABNORMAL HIGH (ref 11.5–15.5)
WBC: 14.9 10*3/uL — AB (ref 4.0–10.5)
nRBC: 0 % (ref 0.0–0.2)

## 2018-02-23 LAB — PROTIME-INR
INR: 1.25
Prothrombin Time: 15.6 seconds — ABNORMAL HIGH (ref 11.4–15.2)

## 2018-02-23 MED ORDER — SODIUM CHLORIDE 0.9 % IV SOLN: INTRAVENOUS | Status: AC

## 2018-02-23 NOTE — Progress Notes (Signed)
Report called and given to [place], RN on [place] and pt to be transferred momentarily. Hale Bogus.

## 2018-02-23 NOTE — Progress Notes (Signed)
[facility] PROGRESS NOTE  DESTEN MANOR FIE:[id number] DOB: 11/19/1955 DOA: 02/21/2018 PCP: [name], MD   Narrative:  62 year old male diagnosed with liver cirrhosis secondary to hep C and alcoholism, hepatocellular CA 06/18/2016 stage II progressed and liver-restaging MRI 10/16/2017 =disease progression- was started on first-line lenvatinib and has had paracentesis every 10 daily Multiple lung nodules Chronic EtOH, TOB Untreated hep C HTN on diuretics Presbycusis left ear  Presented to the ED 10/11 with fall 2 days prior to admission-he was confused (note he had been started on baclofen by oncologist recently) and had an unclear history of being found down per his father Dr. He is unable to perform IADLs  Found to have a sodium of 117 (reportedly still taking diuretics despite being told not to), leukocytosis with an ulcer on left fourth finger, left foot third and fourth toe ulcerations in addition-also found to be encephalopathic etc.  A & Plan  Toxic metabolic encephalopathy- liver cirrhosis versus metastatic liver disease from untreated hep C-poor prognosis overall--family and I had long discussions today and he has improved  See below  Severe hyponatremia-sodium stabilizing-no further SDU monitoring needed-tx to floor given stable biochem anomalies  Multiple lung nodules-unclear significance at this time-- unclear if candidate for aggressive follow up  Chronic EtOH tobacco, probable falls--has bruises possibly from falling--- father and mother mention to me that patient might be surreptitiously drinking-mother states that she has not been in his home in over a year  Presbycusis left ear  Chronic ulcerations of multiple digits-continue antibiotics-MRI was negative for osteomyelitis however the foot is demarcating-I discussed with Dr. Gwenlyn Saran who recommended ABIs and TBI's and he will follow along once results are back although my impression is that the patient is not  the best protoplasm in terms of operative management of the same   DVT prophylaxis: lovenox  Code Status: FUll currently   Family Communication: called dr. Luetta Nutting, spoke with his mother as well   Disposition Plan: sdu    Verlon Au, MD  [facility] Direct contact: [phone] --Via amion app OR  --[email or web address]; password [email or web address]  7PM-7AM contact night coverage as above 02/23/2018, 7:23 AM  LOS: 2 days   Consultants:  Oncology Dr. Burr Medico  Procedures:  X-rays pending  Antimicrobials:  Doxycycline/Augmentin  Interval history/Subjective:  Improved in no distress States he has some back can sacral pain No fever no chills No rash   Objective:  Vitals:  Vitals:   02/23/18 0628 02/23/18 0718  BP: (!) 88/67   Pulse:    Resp:    Temp:  97.6 F (36.4 C)  SpO2:      Exam:  . Awake older than stated age . Bruising to chest and arm . cta b . abd soft but distended and slight fluid shift . Weak overall power grossly 5/5 . TOe ulceration and area over second toe left foot seems to be demarcating and drying I did not appreciate any dorsalis and the patient has a non-dopplerable DP   I have personally reviewed the following:   Labs:  Sodium 118-->121 chloride 86-->92  Calcium 7.8 albumin 1.6  AST/ALT 74/41  WBC down from 19.3-16.1--->14.9  Bili 1.4    Imaging studies:  X-ray of back shows advanced chronic disc endplate degeneration F0-X3  X-ray left ring finger shows no evidence of fracture PIP soft tissue joint swelling  X-ray of left foot shows negative exam  MRI left foot shows no specific anomaly  Medical tests:  Multiple  Test discussed with  performing physician:  no  Decision to obtain old records:  Yes  Review and summation of old records:  Yes  Scheduled Meds: . amoxicillin-clavulanate  1 tablet Oral Q12H  . B-complex with vitamin C  1 tablet Oral Daily  . doxycycline  100 mg Oral Q12H  . enoxaparin (LOVENOX) injection  40 mg  Subcutaneous Q24H  . feeding supplement (ENSURE ENLIVE)  237 mL Oral BID BM  . lactulose  10 g Oral TID  . multivitamin with minerals  1 tablet Oral Daily  . traZODone  100 mg Oral QHS  . triamcinolone ointment  1 application Topical BID   Continuous Infusions:   Active Problems:   Hyponatremia   Pressure injury of skin   Fall   LOS: 2 days

## 2018-02-23 NOTE — Consult Note (Signed)
[facility] Nurse wound consult note Reason for Consult: two full thickness Unstageable pressure injuries to coccygeal area, necrotic 4th toe on left foot, full thickness wound on lateral aspect of left LE. Wound type: Pressure, circulatory vs trauma Pressure Injury POA: Yes Measurement: Coccygeal wounds are embedded within a 9cm x 7cm area of erythema. Right measures 3cm x 2.2cm left measures 2.5cm x 2cm.  Both areas have their depth obscured by the presence of thin, white slough. Minimal exudate (serous) Wound bed:As described above Drainage (amount, consistency, odor) As described above Periwound: erythema, blanching Dressing procedure/placement/frequency: Patient with nonviable 4th toe on left foot, recommend vascular or Orthopedic surgery consult.  If you agree, please order. Conservative care orders using betadine swab stick application to keep the area dry and infection free as long as possible are provided. Pressure redistribution heel boots and a chair pad are provided for patient. Topical care for POC pressure injuries to coccygeal area are provided using a calcium alginate. Turning and repositioning are an essential part of this patient's POC.  He is instructed in this today and indicates understanding.  [place] nursing team will not follow, but will remain available to this patient, the nursing and medical teams.  Please re-consult if needed. Thanks, Maudie Flakes, MSN, RN, [place], Arther Abbott  Pager# [phone]

## 2018-02-24 ENCOUNTER — Inpatient Hospital Stay (HOSPITAL_COMMUNITY): Payer: BLUE CROSS/BLUE SHIELD

## 2018-02-24 DIAGNOSIS — R188 Other ascites: Secondary | ICD-10-CM

## 2018-02-24 DIAGNOSIS — B182 Chronic viral hepatitis C: Secondary | ICD-10-CM

## 2018-02-24 DIAGNOSIS — I96 Gangrene, not elsewhere classified: Secondary | ICD-10-CM

## 2018-02-24 LAB — COMPREHENSIVE METABOLIC PANEL
ALK PHOS: 392 U/L — AB (ref 38–126)
ALT: 47 U/L — ABNORMAL HIGH (ref 0–44)
ANION GAP: 10 (ref 5–15)
AST: 82 U/L — ABNORMAL HIGH (ref 15–41)
Albumin: 1.8 g/dL — ABNORMAL LOW (ref 3.5–5.0)
BILIRUBIN TOTAL: 1.2 mg/dL (ref 0.3–1.2)
BUN: 16 mg/dL (ref 8–23)
CALCIUM: 8.1 mg/dL — AB (ref 8.9–10.3)
CO2: 20 mmol/L — ABNORMAL LOW (ref 22–32)
Chloride: 90 mmol/L — ABNORMAL LOW (ref 98–111)
Creatinine, Ser: 0.43 mg/dL — ABNORMAL LOW (ref 0.61–1.24)
GFR calc Af Amer: >60 mL/min (ref >60)
GFR calc non Af Amer: >60 mL/min (ref >60)
Glucose, Bld: 144 mg/dL — ABNORMAL HIGH (ref 70–99)
POTASSIUM: 4.5 mmol/L (ref 3.5–5.1)
Sodium: 120 mmol/L — ABNORMAL LOW (ref 135–145)
Total Protein: 5.8 g/dL — ABNORMAL LOW (ref 6.5–8.1)

## 2018-02-24 LAB — CBC WITH DIFFERENTIAL/PLATELET
ABS IMMATURE GRANULOCYTES: 0.16 10*3/uL — AB (ref 0.00–0.07)
Basophils Absolute: 0 10*3/uL (ref 0.0–0.1)
Basophils Relative: 0 %
EOS ABS: 0.1 10*3/uL (ref 0.0–0.5)
Eosinophils Relative: 1 %
HCT: 42.3 % (ref 39.0–52.0)
Hemoglobin: 14.4 g/dL (ref 13.0–17.0)
Immature Granulocytes: 1 %
Lymphocytes Relative: 4 %
Lymphs Abs: 0.6 10*3/uL — ABNORMAL LOW (ref 0.7–4.0)
MCH: 31.8 pg (ref 26.0–34.0)
MCHC: 34 g/dL (ref 30.0–36.0)
MCV: 93.4 fL (ref 80.0–100.0)
MONO ABS: 2.1 10*3/uL — AB (ref 0.1–1.0)
Monocytes Relative: 14 %
NEUTROS ABS: 11.5 10*3/uL — AB (ref 1.7–7.7)
NEUTROS PCT: 80 %
PLATELETS: 215 10*3/uL (ref 150–400)
RBC: 4.53 MIL/uL (ref 4.22–5.81)
RDW: 18.8 % — AB (ref 11.5–15.5)
WBC: 14.5 10*3/uL — AB (ref 4.0–10.5)
nRBC: 0 % (ref 0.0–0.2)

## 2018-02-24 MED ORDER — BACLOFEN 10 MG PO TABS
5.0000 mg | ORAL_TABLET | Freq: Three times a day (TID) | ORAL | Status: DC | PRN
  Administered 2018-02-24 – 2018-03-01 (×2): 10 mg via ORAL
  Filled 2018-02-24 (×2): qty 1

## 2018-02-24 MED ORDER — DICLOFENAC SODIUM 1 % TD GEL
4.0000 g | Freq: Four times a day (QID) | TRANSDERMAL | Status: DC
  Administered 2018-02-24 – 2018-03-05 (×29): 4 g via TOPICAL
  Filled 2018-02-24 (×2): qty 100

## 2018-02-24 MED ORDER — SODIUM CHLORIDE 0.9 % IV SOLN
INTRAVENOUS | Status: DC
  Administered 2018-02-24 – 2018-02-27 (×9): via INTRAVENOUS

## 2018-02-24 MED ORDER — BACLOFEN 5 MG PO TABS: 5.0000 mg | ORAL_TABLET | Freq: Three times a day (TID) | ORAL | Status: DC | PRN

## 2018-02-24 MED ORDER — HYDROCODONE-ACETAMINOPHEN 5-325 MG PO TABS
1.0000 | ORAL_TABLET | ORAL | Status: DC | PRN
  Administered 2018-02-24 – 2018-03-01 (×16): 2 via ORAL
  Administered 2018-03-01 – 2018-03-02 (×2): 1 via ORAL
  Administered 2018-03-03: 2 via ORAL
  Filled 2018-02-24 (×18): qty 2
  Filled 2018-02-24: qty 1
  Filled 2018-02-24: qty 2

## 2018-02-24 MED ORDER — TRIAMCINOLONE ACETONIDE 0.5 % EX CREA
TOPICAL_CREAM | Freq: Two times a day (BID) | CUTANEOUS | Status: DC
  Administered 2018-02-24: 1 via TOPICAL
  Administered 2018-02-25 – 2018-03-05 (×14): via TOPICAL
  Filled 2018-02-24: qty 15

## 2018-02-24 NOTE — Progress Notes (Signed)
[facility] PROGRESS NOTE  HARIM BI OEU:[id number] DOB: [birth year] DOA: 02/21/2018 PCP: [name], MD   Narrative:  62 year old male diagnosed with liver cirrhosis secondary to hep C and alcoholism, hepatocellular CA 06/18/2016 stage II progressed and liver-restaging MRI 10/16/2017 =disease progression- was started on first-line lenvatinib and has had paracentesis every 10 daily Multiple lung nodules Chronic EtOH, TOB Untreated hep C HTN on diuretics Presbycusis left ear  Presented to the ED 10/11 with fall 2 days prior to admission-he was confused (note he had been started on baclofen by oncologist recently) and had an unclear history of being found down per his father Dr. He is unable to perform IADLs  Found to have a sodium of 117 (reportedly still taking diuretics despite being told not to), leukocytosis with an ulcer on left fourth finger, left foot third and fourth toe ulcerations in addition-also found to be encephalopathic etc.  A & Plan  Toxic metabolic encephalopathy- ? untreated hep C-poor prognosis overall--family and I discussed--will need SNF  Liver CA stg ii-poor prognosis if continues to drink-need OP follow-up  Severe hyponatremia-sodium stabilizing-no further SDU monitoring needed-re-started IV saline 100 cc/h--monitor--if no better would get urine studies in am  Multiple lung nodules-unclear significance at this time-- unclear if candidate for aggressive follow up  Chronic EtOH tobacco, probable falls--has bruises possibly from falling--- family shared ? might be surreptitiously drinking-mother states that she has not been in his home in over a year  Presbycusis left ear  Chronic ulcerations of multiple digits-continue antibiotics-MRI was negative for osteomyelitis however the foot is demarcating-await ABI/TBI  Sacral decub-9x7 cm erythema-- Right measures 3cm x 2.2cm left measures 2.5cm x 2cm--dress with calcium alginate--restart baclofen for  pain--adding low dose Vicodin   DVT prophylaxis: lovenox  Code Status: Full currently   Family Communication: called dr. Luetta Nutting, spoke with his mother as well   Disposition Plan: sdu    Verlon Au, MD  [facility] Direct contact: [phone] --Via amion app OR  --[email or web address]; password [email or web address]  7PM-7AM contact night coverage as above 02/24/2018, 10:57 AM  LOS: 3 days   Consultants:  Oncology Dr. Burr Medico  Procedures:  X-rays pending  Antimicrobials:  Doxycycline/Augmentin  Interval history/Subjective:  Improved in no distress States he has some back can sacral pain No fever no chills No rash   Objective:  Vitals:  Vitals:   02/23/18 2206 02/24/18 0424  BP: 90/73 91/72  Pulse: 98 91  Resp: 16 16  Temp: 98 F (36.7 C) 97.7 F (36.5 C)  SpO2: 94% 96%    Exam:  . Awake older than stated age-cachectic . Bruising to chest and arm . cta b . abd soft but distended and slight fluid shift . Weak overall power grossly 5/5 . TOe ulceration and area over second toe left foot seems to be demarcating and drying    I have personally reviewed the following:   Labs:  Sodium 118-->121-->120 chloride 86-->92-->90  CO2 20  Calcium 8.1 albumin 1.6  AST/ALT 74/41-->82/47  WBC down from 19.3-16.1--->14.9  Bili 1.4  Imaging studies:  X-ray of back shows advanced chronic disc endplate degeneration P5-K9  X-ray left ring finger shows no evidence of fracture PIP soft tissue joint swelling  X-ray of left foot shows negative exam  MRI left foot shows no specific anomaly  Medical tests:  Multiple  Test discussed with performing physician:  no  Decision to obtain old records:  Yes  Review and summation of old records:  Yes  Scheduled  Meds: . amoxicillin-clavulanate  1 tablet Oral Q12H  . B-complex with vitamin C  1 tablet Oral Daily  . diclofenac sodium  4 g Topical QID  . doxycycline  100 mg Oral Q12H  . enoxaparin (LOVENOX) injection  40 mg  Subcutaneous Q24H  . feeding supplement (ENSURE ENLIVE)  237 mL Oral BID BM  . lactulose  10 g Oral TID  . multivitamin with minerals  1 tablet Oral Daily  . traZODone  100 mg Oral QHS  . triamcinolone cream   Topical BID   Continuous Infusions: . sodium chloride 100 mL/hr at 02/24/18 1001    Active Problems:   Hyponatremia   Pressure injury of skin   Fall   LOS: 3 days

## 2018-02-24 NOTE — Progress Notes (Signed)
VASCULAR LAB PRELIMINARY  ARTERIAL  ABI completed:    RIGHT    LEFT    PRESSURE WAVEFORM  PRESSURE WAVEFORM  BRACHIAL 100 Triphasic BRACHIAL Unable to obtain Triphasic  DP 91 Monophasic DP 94 Dampened monophasic  AT   AT    PT 84 Monophasic PT 34 Dampened monophasic  PER   PER    GREAT TOE 47 NA GREAT TOE  Absent    RIGHT LEFT  ABI 0.91 0.94  TBI 0.47 0.00   Bilateral ABIs are suggestive of mild arterial insufficiency at rest. However, given diminished waveforms these may be falsely elevated. The right TBI is abnormal. The left TBI is unable to be calculated due to absent waveform.  02/24/2018 4:57 PM Maudry Mayhew, MHA, RVT, RDCS, RDMS

## 2018-02-24 NOTE — Progress Notes (Signed)
Joshua [name]   DOB:April 06, 1956   HB#:[id number]   YBO#:[id number]  ONCOLOGY FOLLOW UP  Subjective: Patient was transferred from ICU to regular floor, still very weak, has spent most time in bed, has low appetite, no other new complaints.  Objective:  Vitals:   02/24/18 0424 02/24/18 1458  BP: 91/72 106/80  Pulse: 91 93  Resp: 16 16  Temp: 97.7 F (36.5 C) (!) 97.4 F (36.3 C)  SpO2: 96% 95%    Body mass index is 16.91 kg/m.  Intake/Output Summary (Last 24 hours) at 02/24/2018 1949 Last data filed at 02/24/2018 1858 Gross per 24 hour  Intake 200 ml  Output 200 ml  Net 0 ml     Sclerae unicteric  Oropharynx clear  No peripheral adenopathy  Lungs clear -- no rales or rhonchi  Heart regular rate and rhythm  Abdomen benign  MSK no focal spinal tenderness, no peripheral edema  Neuro nonfocal, he is wake and alert but answers questions slowly     CBG (last 3)  No results for input(s): GLUCAP in the last 72 hours.   Labs:  Lab Results  Component Value Date   WBC 14.5 (H) 02/24/2018   HGB 14.4 02/24/2018   HCT 42.3 02/24/2018   MCV 93.4 02/24/2018   PLT 215 02/24/2018   NEUTROABS 11.5 (H) 02/24/2018   / CMP Latest Ref Rng & Units 02/24/2018 02/23/2018 02/22/2018  Glucose 70 - 99 mg/dL 144(H) 107(H) 96  BUN 8 - 23 mg/dL 16 13 12   Creatinine 0.61 - 1.24 mg/dL 0.43(L) 0.40(L) 0.43(L)  Sodium 135 - 145 mmol/L 120(L) 121(L) 120(L)  Potassium 3.5 - 5.1 mmol/L 4.5 4.4 4.7  Chloride 98 - 111 mmol/L 90(L) 92(L) 88(L)  CO2 22 - 32 mmol/L 20(L) 22 20(L)  Calcium 8.9 - 10.3 mg/dL 8.1(L) 7.8(L) 8.0(L)  Total Protein 6.5 - 8.1 g/dL 5.8(L) 5.3(L) -  Total Bilirubin 0.3 - 1.2 mg/dL 1.2 1.4(H) -  Alkaline Phos 38 - 126 U/L 392(H) 319(H) -  AST 15 - 41 U/L 82(H) 74(H) -  ALT 0 - 44 U/L 47(H) 41 -     Urine Studies No results for input(s): UHGB, CRYS in the last 72 hours.  Invalid input(s): UACOL, UAPR, USPG, UPH, UTP, UGL, UKET, UBIL, UNIT, UROB, [place], UEPI, UWBC, [place],  [place], [place], [place], [place]  Basic Metabolic Panel: Recent Labs  Lab 02/21/18 1236  02/22/18 0210 02/22/18 0704 02/22/18 1051 02/23/18 0251 02/24/18 0518  NA 117*   < > 118* 118* 120* 121* 120*  K 5.4*   < > 4.6 5.5* 4.7 4.4 4.5  CL 78*   < > 86* 85* 88* 92* 90*  CO2 24   < > 22 20* 20* 22 20*  GLUCOSE 100*   < > 96 91 96 107* 144*  BUN 17   < > 13 13 12 13 16   CREATININE 0.50*   < > 0.44* 0.33* 0.43* 0.40* 0.43*  CALCIUM 8.4*   < > 8.0* 7.5* 8.0* 7.8* 8.1*  MG 2.0  --   --   --   --   --   --    < > = values in this interval not displayed.   GFR Estimated Creatinine Clearance: 75.4 mL/min (A) (by C-G formula based on SCr of 0.43 mg/dL (L)). Liver Function Tests: Recent Labs  Lab 02/21/18 1236 02/22/18 0210 02/23/18 0251 02/24/18 0518  AST 98* 78* 74* 82*  ALT 48* 43 41 47*  ALKPHOS 310* 270*  319* 392*  BILITOT 1.8* 1.6* 1.4* 1.2  PROT 6.9 5.6* 5.3* 5.8*  ALBUMIN 2.2* 1.8* 1.6* 1.8*   Recent Labs  Lab 02/21/18 1236  LIPASE 30   Recent Labs  Lab 02/21/18 1657  AMMONIA 22   Coagulation profile Recent Labs  Lab 02/21/18 1500 02/23/18 0251  INR 1.21 1.25    CBC: Recent Labs  Lab 02/21/18 1236 02/21/18 1657 02/22/18 0210 02/23/18 0251 02/24/18 0518  WBC 19.6* 19.3* 16.1* 14.9* 14.5*  NEUTROABS 16.1*  --   --  11.7* 11.5*  HGB 15.6 14.3 13.5 13.1 14.4  HCT 45.0 41.9 39.5 38.1* 42.3  MCV 91.3 92.9 92.5 92.5 93.4  PLT 250 221 202 191 215   Cardiac Enzymes: No results for input(s): CKTOTAL, CKMB, CKMBINDEX, TROPONINI in the last 168 hours. BNP: Invalid input(s): POCBNP CBG: No results for input(s): GLUCAP in the last 168 hours. D-Dimer No results for input(s): DDIMER in the last 72 hours. Hgb A1c No results for input(s): HGBA1C in the last 72 hours. Lipid Profile No results for input(s): CHOL, HDL, LDLCALC, TRIG, CHOLHDL, LDLDIRECT in the last 72 hours. Thyroid function studies No results for input(s): TSH, T4TOTAL, T3FREE, THYROIDAB in the last  72 hours.  Invalid input(s): FREET3 Anemia work up No results for input(s): VITAMINB12, FOLATE, FERRITIN, TIBC, IRON, RETICCTPCT in the last 72 hours. Microbiology Recent Results (from the past 240 hour(s))  MRSA PCR Screening     Status: None   Collection Time: 02/21/18  4:00 PM  Result Value Ref Range Status   MRSA by PCR NEGATIVE NEGATIVE Final    Comment:        The GeneXpert MRSA Assay (FDA approved for NASAL specimens only), is one component of a comprehensive MRSA colonization surveillance program. It is not intended to diagnose MRSA infection nor to guide or monitor treatment for MRSA infections. Performed at [facility], [place] [address] [place], [place] [address]   Culture, blood (routine x 2)     Status: None (Preliminary result)   Collection Time: 02/21/18  5:02 PM  Result Value Ref Range Status   Specimen Description   Final    BLOOD RIGHT ANTECUBITAL Performed at [facility] [address], [place], [place] [address]    Special Requests   Final    BOTTLES DRAWN AEROBIC AND ANAEROBIC Blood Culture adequate volume Performed at [facility] [address], [place], [place] [address]    Culture   Final    NO GROWTH 2 DAYS Performed at [facility] [address] [place], [place] [address]    Report Status PENDING  Incomplete  Culture, blood (routine x 2)     Status: None (Preliminary result)   Collection Time: 02/21/18  5:03 PM  Result Value Ref Range Status   Specimen Description   Final    BLOOD RIGHT HAND Performed at [facility] [address], [place], [place] [address]    Special Requests   Final    BOTTLES DRAWN AEROBIC ONLY Blood Culture adequate volume Performed at [facility] [address], [place], [place] [address]    Culture   Final    NO GROWTH 2 DAYS Performed at [facility] [address], [place], [place] [address]     Report Status PENDING  Incomplete      Studies:  No results found.  Assessment: 62 y.o. with PMH of hepatocellular carcinoma, liver cirrhosis,  hepatitis C, alcoholism, presented to hospital after a fall at home, along with progressive weakness.  1. Progressive weakness and fall at home 2.hyponatremia, persistent  3.  Multifocal hepatocellular carcinoma 4. Ascites  5. Liver cirrhosis  6. Treated hep C 7. History of alcohol abuse  8. HTN  9.  Progressive weakness 10.  Anorexia 11. Fall at home and deconditioning    Plan:  -Pt has not made much progress since admission, he remains to be very weak, spent most time in the bed, sluggish, with no appetite.  His sodium remains to be around 120. -Unfortunately there is limited medical treatments we can offer to improve his condition.  I think his overall poor condition is related to his underlying liver cancer and liver cirrhosis. His prognosis is guarded  -I do not think he is a candidate for further liver cancer treatment, due to his declining and extremely poor performance status. -Physical therapy has recommended SNF, patient wants to go home, however his elderly parents are likely not able to provide the care he needs.  He is at very high risk for fall. -I recommend SNF along with hospice service. I will call his mother tomorrow to see what she thinks.  -I will f/u   Truitt Merle, MD 02/24/2018  7:49 PM

## 2018-02-24 NOTE — Progress Notes (Signed)
Physical Therapy Treatment Patient Details Name: Joshua [name] MRN: [id number] DOB: February 01, 1956 Today's Date: 02/24/2018    History of Present Illness 62 y.o. male with medical history significant of hepatocellular carcinoma, cirrhosis, alcoholism, hepatitis C, multiple other chronic medical problems presenting after a fall at home and diagnosed with toxic metabolic encephalopathy related either to liver cirrhosis versus metastatic liver disease from untreated hep C    PT Comments    Progressing slowly with mobility. Pt remains at risk for falls. Continue to recommend SNF.    Follow Up Recommendations  SNF     Equipment Recommendations  None recommended by PT    Recommendations for Other Services       Precautions / Restrictions Precautions Precautions: Fall Precaution Comments: fragile skin Restrictions Weight Bearing Restrictions: No    Mobility  Bed Mobility Overal bed mobility: Needs Assistance Bed Mobility: Sidelying to Sit;Sit to Sidelying   Sidelying to sit: Mod assist     Sit to sidelying: Mod assist General bed mobility comments: assist for trunk upright, assist for LEs onto bed  Transfers Overall transfer level: Needs assistance Equipment used: Rolling walker (2 wheeled) Transfers: Sit to/from Stand Sit to Stand: From elevated surface;Min assist         General transfer comment: assist to rise and steady as well as control descent. VCs safety, hand placement  Ambulation/Gait Ambulation/Gait assistance: Min assist Gait Distance (Feet): 15 Feet Assistive device: Rolling walker (2 wheeled) Gait Pattern/deviations: Step-through pattern     General Gait Details: Assist to stabilize pt throughout distance. At risk for falls. Fatigues easily. Cues for safety   Stairs             Wheelchair Mobility    Modified Rankin (Stroke Patients Only)       Balance Overall balance assessment: Needs assistance;History of Falls         Standing  balance support: Bilateral upper extremity supported Standing balance-Leahy Scale: Poor                              Cognition Arousal/Alertness: Awake/alert Behavior During Therapy: WFL for tasks assessed/performed Overall Cognitive Status: Within Functional Limits for tasks assessed                                        Exercises      General Comments        Pertinent Vitals/Pain Pain Assessment: 0-10 Pain Score: 5  Pain Location: LEs Pain Descriptors / Indicators: Tender Pain Intervention(s): Limited activity within patient's tolerance;Repositioned    Home Living                      Prior Function            PT Goals (current goals can now be found in the care plan section) Progress towards PT goals: Progressing toward goals    Frequency    Min 3X/week      PT Plan Current plan remains appropriate    Co-evaluation              AM-PAC PT "6 Clicks" Daily Activity  Outcome Measure  Difficulty turning over in bed (including adjusting bedclothes, sheets and blankets)?: Unable Difficulty moving from lying on back to sitting on the side of the bed? : Unable Difficulty sitting down on and  standing up from a chair with arms (e.g., wheelchair, bedside commode, etc,.)?: Unable Help needed moving to and from a bed to chair (including a wheelchair)?: A Lot Help needed walking in hospital room?: A Lot Help needed climbing 3-5 steps with a railing? : Total 6 Click Score: 8    End of Session Equipment Utilized During Treatment: Gait belt Activity Tolerance: Patient limited by fatigue Patient left: in bed;with call bell/phone within reach;with bed alarm set   PT Visit Diagnosis: Difficulty in walking, not elsewhere classified (R26.2);Muscle weakness (generalized) (M62.81)     Time: 1236-1300 PT Time Calculation (min) (ACUTE ONLY): 24 min  Charges:  $Gait Training: 8-22 mins $Therapeutic Activity: 8-22 mins                         Weston Anna, PT Acute Rehabilitation Services Pager: [phone] Office: [phone]

## 2018-02-25 ENCOUNTER — Inpatient Hospital Stay: Payer: BLUE CROSS/BLUE SHIELD

## 2018-02-25 LAB — CBC WITH DIFFERENTIAL/PLATELET
Abs Immature Granulocytes: 0.16 10*3/uL — ABNORMAL HIGH (ref 0.00–0.07)
BASOS PCT: 0 %
Basophils Absolute: 0 10*3/uL (ref 0.0–0.1)
EOS ABS: 0 10*3/uL (ref 0.0–0.5)
EOS PCT: 0 %
HCT: 39.3 % (ref 39.0–52.0)
Hemoglobin: 13.3 g/dL (ref 13.0–17.0)
Immature Granulocytes: 1 %
Lymphocytes Relative: 3 %
Lymphs Abs: 0.4 10*3/uL — ABNORMAL LOW (ref 0.7–4.0)
MCH: 32 pg (ref 26.0–34.0)
MCHC: 33.8 g/dL (ref 30.0–36.0)
MCV: 94.5 fL (ref 80.0–100.0)
MONOS PCT: 14 %
Monocytes Absolute: 2.2 10*3/uL — ABNORMAL HIGH (ref 0.1–1.0)
NRBC: 0 % (ref 0.0–0.2)
Neutro Abs: 13.2 10*3/uL — ABNORMAL HIGH (ref 1.7–7.7)
Neutrophils Relative %: 82 %
PLATELETS: 183 10*3/uL (ref 150–400)
RBC: 4.16 MIL/uL — ABNORMAL LOW (ref 4.22–5.81)
RDW: 18.9 % — AB (ref 11.5–15.5)
WBC: 16.1 10*3/uL — ABNORMAL HIGH (ref 4.0–10.5)

## 2018-02-25 LAB — COMPREHENSIVE METABOLIC PANEL
ALK PHOS: 443 U/L — AB (ref 38–126)
ALT: 49 U/L — AB (ref 0–44)
AST: 84 U/L — AB (ref 15–41)
Albumin: 1.6 g/dL — ABNORMAL LOW (ref 3.5–5.0)
Anion gap: 8 (ref 5–15)
BILIRUBIN TOTAL: 1.1 mg/dL (ref 0.3–1.2)
BUN: 23 mg/dL (ref 8–23)
CO2: 21 mmol/L — ABNORMAL LOW (ref 22–32)
CREATININE: 0.59 mg/dL — AB (ref 0.61–1.24)
Calcium: 7.7 mg/dL — ABNORMAL LOW (ref 8.9–10.3)
Chloride: 97 mmol/L — ABNORMAL LOW (ref 98–111)
GFR calc Af Amer: >60 mL/min (ref >60)
Glucose, Bld: 113 mg/dL — ABNORMAL HIGH (ref 70–99)
Potassium: 4.8 mmol/L (ref 3.5–5.1)
Sodium: 126 mmol/L — ABNORMAL LOW (ref 135–145)
TOTAL PROTEIN: 5.4 g/dL — AB (ref 6.5–8.1)

## 2018-02-25 NOTE — Consult Note (Addendum)
Hospital Consult    Reason for Consult: Necrosis left fourth toe Referring Physician: Dr. Verlon Au MRN #:  [id number]  History of Present Illness: This is a 62 y.o. male here with altered mental status and has known liver cancer that appears to be poor prognosis.  He has had hyponatremia that is improving.  Was noted to have a left toe ulcer which has quickly worsened while in the hospital.  He has never had lower extremity surgery before.  He is a chronic everyday smoker.  He also has hypertension as a risk factor for vascular disease.  He has never had toe amputations before.  Does have multiple skin ulcerations as well as sacral decubitus ulcer at the time.  Past Medical History:  Diagnosis Date  . Cancer ([place])   . Cataract   . Chronic alcoholism ([place])   . Cirrhosis ([place])   . Depression   . Glucose intolerance (impaired glucose tolerance)   . Hepatitis C   . Hypertension   . Tobacco use   . Umbilical hernia     Past Surgical History:  Procedure Laterality Date  . CATARACT EXTRACTION    . COLONOSCOPY  12/09  . INGUINAL HERNIA REPAIR    . IR 3D INDEPENDENT WKST  09/13/2016  . IR [place] ADDITIONAL VESSEL  09/13/2016  . IR [place] ADDITIONAL VESSEL  09/13/2016  . IR [place] ADDITIONAL VESSEL  09/13/2016  . IR [place] ADDITIONAL VESSEL  09/13/2016  . IR [place] ADDITIONAL VESSEL  09/13/2016  . IR ANGIOGRAM SELECTIVE EACH ADDITIONAL VESSEL  06/27/2017  . IR ANGIOGRAM SELECTIVE EACH ADDITIONAL VESSEL  06/27/2017  . IR ANGIOGRAM SELECTIVE EACH ADDITIONAL VESSEL  06/27/2017  . IR ANGIOGRAM SELECTIVE EACH ADDITIONAL VESSEL  07/09/2017  . IR ANGIOGRAM SELECTIVE EACH ADDITIONAL VESSEL  07/09/2017  . IR ANGIOGRAM VISCERAL SELECTIVE  09/13/2016  . IR ANGIOGRAM VISCERAL SELECTIVE  06/27/2017  . IR ANGIOGRAM VISCERAL SELECTIVE  06/27/2017  . IR ANGIOGRAM VISCERAL SELECTIVE  07/09/2017  . IR EMBO TUMOR ORGAN ISCHEMIA  INFARCT INC GUIDE ROADMAPPING  09/13/2016  . IR EMBO TUMOR ORGAN ISCHEMIA INFARCT INC GUIDE ROADMAPPING  07/09/2017  . IR PARACENTESIS  10/10/2017  . IR PARACENTESIS  10/28/2017  . IR PARACENTESIS  11/11/2017  . IR PARACENTESIS  11/21/2017  . IR PARACENTESIS  12/20/2017  . IR PARACENTESIS  01/10/2018  . IR PARACENTESIS  01/27/2018  . IR RADIOLOGIST EVAL & MGMT  08/21/2016  . IR RADIOLOGIST EVAL & MGMT  08/30/2016  . IR RADIOLOGIST EVAL & MGMT  09/26/2016  . IR RADIOLOGIST EVAL & MGMT  01/10/2017  . IR RADIOLOGIST EVAL & MGMT  11/06/2016  . IR RADIOLOGIST EVAL & MGMT  05/16/2017  . IR RADIOLOGIST EVAL & MGMT  06/13/2017  . IR RADIOLOGIST EVAL & MGMT  08/01/2017  . IR RADIOLOGIST EVAL & MGMT  10/16/2017  . IR US GUIDE VASC ACCESS RIGHT  09/13/2016  . IR US GUIDE VASC ACCESS RIGHT  06/27/2017  . IR US GUIDE VASC ACCESS RIGHT  07/09/2017  . left orchiectomy  07/2009  . RADIOFREQUENCY ABLATION N/A 10/05/2016   Procedure: LIVER MICROWAVE THERMAL ABLATION;  Surgeon: Sandi Mariscal, MD;  Location: [facility];  Service: Anesthesiology;  Laterality: N/A;  . UMBILICAL HERNIA REPAIR      No Known Allergies  Prior to Admission medications   Medication Sig Start Date End Date Taking? Authorizing Provider  acetaminophen (TYLENOL) 325 MG tablet Take 650  mg by mouth every 6 (six) hours as needed (for headaches.).   Yes [provider]  b complex vitamins capsule Take 1 capsule by mouth daily.   Yes [provider]  Baclofen 5 MG TABS Take 5-10 mg by mouth every 8 (eight) hours as needed. Patient taking differently: Take 5-10 mg by mouth every 8 (eight) hours as needed (muscle spasm).  02/13/18  Yes Truitt Merle, MD  cephALEXin (KEFLEX) 500 MG capsule Take 1 capsule (500 mg total) by mouth 4 (four) times daily. 02/10/18  Yes Burchette, Alinda Sierras, MD  furosemide (LASIX) 40 MG tablet Take 40 mg by mouth 2 (two) times daily. Take doses 6hr apart   Yes [provider]  lactulose (CEPHULAC) 10 g packet Take 1 packet  (10 g total) by mouth 3 (three) times daily. 01/08/18  Yes Truitt Merle, MD  Lenvatinib 12 mg daily dose (LENVIMA) 3 x 4 MG capsule Take 12 mg by mouth daily. 01/24/18  Yes Truitt Merle, MD  Multiple Vitamin (MULTIVITAMIN WITH MINERALS) TABS tablet Take 1 tablet by mouth daily.   Yes [provider]  prochlorperazine (COMPAZINE) 10 MG tablet TAKE 1 TABLET(10 MG) BY MOUTH EVERY 6 HOURS AS NEEDED FOR NAUSEA OR VOMITING Patient taking differently: Take 10 mg by mouth every 6 (six) hours as needed for nausea or vomiting.  02/14/18  Yes Truitt Merle, MD  spironolactone (ALDACTONE) 100 MG tablet Take 100 mg by mouth 2 (two) times daily.    Yes [provider]  traZODone (DESYREL) 50 MG tablet Take 2 tablets (100 mg total) by mouth at bedtime. 01/21/18  Yes Truitt Merle, MD  triamcinolone ointment (KENALOG) 0.5 % Apply 1 application topically 2 (two) times daily. 12/16/17  Yes Truitt Merle, MD  lisinopril-hydrochlorothiazide (PRINZIDE,ZESTORETIC) 10-12.5 MG tablet Take 1 tablet by mouth daily. Patient not taking: Reported on 02/21/2018 02/28/17   Marletta Lor, MD    Social History   Socioeconomic History  . Marital status: Single    Spouse name: Not on file  . Number of children: Not on file  . Years of education: Not on file  . Highest education level: Not on file  Occupational History  . Not on file  Social Needs  . Financial resource strain: Not on file  . Food insecurity:    Worry: Not on file    Inability: Not on file  . Transportation needs:    Medical: Not on file    Non-medical: Not on file  Tobacco Use  . Smoking status: Former Smoker    Packs/day: 0.50    Years: 42.00    Pack years: 21.00    Types: Cigarettes    Start date: 05/21/1971    Last attempt to quit: 07/28/2017    Years since quitting: 0.5  . Smokeless tobacco: Former Systems developer  . Tobacco comment: Has cut down to about 1/3 pack per day  Substance and Sexual Activity  . Alcohol use: No    Alcohol/week: 28.0 standard  drinks    Types: 28 Cans of beer per week    Comment: hx of nothing current   . Drug use: No  . Sexual activity: Not on file  Lifestyle  . Physical activity:    Days per week: Not on file    Minutes per session: Not on file  . Stress: Not on file  Relationships  . Social connections:    Talks on phone: Not on file    Gets together: Not on file  Attends religious service: Not on file    Active member of club or organization: Not on file    Attends meetings of clubs or organizations: Not on file    Relationship status: Not on file  . Intimate partner violence:    Fear of current or ex partner: Not on file    Emotionally abused: Not on file    Physically abused: Not on file    Forced sexual activity: Not on file  Other Topics Concern  . Not on file  Social History Narrative  . Not on file     Family History  Problem Relation Age of Onset  . Healthy Mother   . Colon cancer Neg Hx   . Stomach cancer Neg Hx   . Esophageal cancer Neg Hx   . Rectal cancer Neg Hx     ROS: [x]  Positive   [ ]  Negative   [ ]  All sytems reviewed and are negative  Cardiovascular: []  chest pain/pressure []  palpitations []  SOB lying flat []  DOE []  pain in legs while walking []  pain in legs at rest [x]  pain in legs at night []  non-healing ulcers []  hx of DVT [x]  swelling in legs  Pulmonary: []  productive cough []  asthma/wheezing []  home O2  Neurologic: [x]  weakness in []  arms []  legs []  numbness in []  arms []  legs []  hx of CVA []  mini stroke [] difficulty speaking or slurred speech []  temporary loss of vision in one eye []  dizziness  Hematologic: [x]  hx of cancer [xx] bleeding problems []  problems with blood clotting easily  Endocrine:   []  diabetes []  thyroid disease  GI []  vomiting blood []  blood in stool  GU: []  CKD/renal failure []  HD--[]  M/W/F or []  T/T/S []  burning with urination []  blood in urine  Psychiatric: []  anxiety []  depression  Musculoskeletal: []   arthritis []  joint pain  Integumentary: []  rashes [x]  ulcers  Constitutional: []  fever []  chills   Physical Examination  Vitals:   02/25/18 0430 02/25/18 1504  BP: 102/73 114/85  Pulse: 92 89  Resp: 16 16  Temp: 98 F (36.7 C) 98.2 F (36.8 C)  SpO2: 91% 95%   Body mass index is 16.91 kg/m.  General: Currently in no acute distress HENT: WNL, normocephalic Pulmonary: normal non-labored breathing Cardiac: No palpable lower extremity pulses feet are warm bilaterally Abdomen: Distended Extremities: Multiple upper extremity lower extremity ulcerations are superficial There is necrosis that appears dry of his left fourth toe Neurologic: A&O X 3; Appropriate Affect ; SENSATION: normal; MOTOR FUNCTION:  moving all extremities equally. Speech is fluent/normal   CBC    Component Value Date/Time   WBC 16.1 (H) 02/25/2018 0419   RBC 4.16 (L) 02/25/2018 0419   HGB 13.3 02/25/2018 0419   HCT 39.3 02/25/2018 0419   PLT 183 02/25/2018 0419   MCV 94.5 02/25/2018 0419   MCH 32.0 02/25/2018 0419   MCHC 33.8 02/25/2018 0419   RDW 18.9 (H) 02/25/2018 0419   LYMPHSABS 0.4 (L) 02/25/2018 0419   MONOABS 2.2 (H) 02/25/2018 0419   EOSABS 0.0 02/25/2018 0419   BASOSABS 0.0 02/25/2018 0419    BMET    Component Value Date/Time   NA 126 (L) 02/25/2018 0419   K 4.8 02/25/2018 0419   CL 97 (L) 02/25/2018 0419   CO2 21 (L) 02/25/2018 0419   GLUCOSE 113 (H) 02/25/2018 0419   GLUCOSE 91 05/01/2006 1047   BUN 23 02/25/2018 0419   CREATININE 0.59 (L) 02/25/2018 2376  CREATININE 0.57 (L) 10/09/2017 1159   CALCIUM 7.7 (L) 02/25/2018 0419   GFRNONAA >60 02/25/2018 0419   GFRNONAA 111 10/09/2017 1159   GFRAA >60 02/25/2018 0419   GFRAA 128 10/09/2017 1159    COAGS: Lab Results  Component Value Date   INR 1.25 02/23/2018   INR 1.21 02/21/2018   INR 1.0 10/09/2017     Non-Invasive Vascular Imaging:   ABI on the right 0.91 left 0.94 TBI right 0.47 the left  0   ASSESSMENT/PLAN: This is a 62 y.o. male with hepatic cancer that has poor prognosis.  He unfortunately has a necrotic toe as well that appears to have rapidly progressed.  Very possible that he could have involvement of other toes requiring amputation if he does not have revascularization first.  There is discussion regarding palliative care at this time.  If he is to continue with aggressive management and would like consideration of revascularization we could consider an endovascular only approach and he would likely need toe amputation to prevent spread of necrosis if we are able to improve his blood flow.  Unfortunately I am unsure that any of this will help his overall course.  I will be available as needed for further discussion.  Renaud Celli C. Donzetta Matters, MD [facility] Office: [phone] Pager: [phone]

## 2018-02-25 NOTE — Progress Notes (Signed)
[facility] PROGRESS NOTE  Joshua [name] PYK:[id number] DOB: 04-24-1956 DOA: 02/21/2018 PCP: [name], MD   Narrative:  62 year old male diagnosed with liver cirrhosis secondary to hep C and alcoholism, hepatocellular CA 06/18/2016 stage II progressed and liver-restaging MRI 10/16/2017 =disease progression- was started on first-line lenvatinib and has had paracentesis every 10 daily Multiple lung nodules Chronic EtOH, TOB Untreated hep C HTN on diuretics Presbycusis left ear  Presented to the ED 10/11 with fall 2 days prior to admission-he was confused (note he had been started on baclofen by oncologist recently) and had an unclear history of being found down per his father Dr. He is unable to perform IADLs  Found to have a sodium of 117 (reportedly still taking diuretics despite being told not to), leukocytosis with an ulcer on left fourth finger, left foot third and fourth toe ulcerations in addition-also found to be encephalopathic etc.  A & Plan  Toxic metabolic encephalopathy- ? untreated hep C-poor prognosis overall--Will involve palliative care to delineate care  Liver CA stg ii-poor prognosis if continues to drink-need OP follow-up  Severe hyponatremia-sodium stabilizing-re-started IV saline 100 cc/h--improved--cut back saline in 1-2 days  Multiple lung nodules-unclear significance at this time-- unclear if candidate for aggressive follow up  Chronic EtOH tobacco, probable falls--has bruises possibly from falling--- family shared ? might be surreptitiously drinking-mother states that she has not been in his home in over a year  Presbycusis left ear  Chronic ulcerations of multiple digits-continue antibiotics-MRI was negative for osteomyelitis however the foot is demarcating--poor surgical candidate--will need to further delineate goals  Sacral decub-9x7 cm erythema-- Right measures 3cm x 2.2cm left measures 2.5cm x 2cm--dress with calcium alginate--restart  baclofen for pain--adding low dose Vicodin   DVT prophylaxis: lovenox  Code Status: Full currently   Family Communication: called dr. Luetta Nutting, spoke with his mother as well   Disposition Plan: sdu    Verlon Au, MD  [facility] Direct contact: [phone] --Via amion app OR  --[email or web address]; password [email or web address]  7PM-7AM contact night coverage as above 02/25/2018, 4:44 PM  LOS: 4 days   Consultants:  Oncology Dr. Burr Medico  Procedures:  X-rays pending  Antimicrobials:  Doxycycline/Augmentin  Interval history/Subjective:  Improved in no distress States he has some back can sacral pain No fever no chills No rash   Objective:  Vitals:  Vitals:   02/25/18 0430 02/25/18 1504  BP: 102/73 114/85  Pulse: 92 89  Resp: 16 16  Temp: 98 F (36.7 C) 98.2 F (36.8 C)  SpO2: 91% 95%    Exam:  . Awake older than stated age-cachectic . Bruising to chest and arm . cta b . abd soft but distended and slight fluid shift . Weak overall power grossly 5/5 . Toe ulceration not seen today . Sacraum visualised today and stage 3 sores noted without slough--painful   I have personally reviewed the following:   Labs:  Sodium 118-->121-->120-->126 chloride 86-->92-->90-->97  CO2 20-->21  Calcium 7/7 albumin 1.6  AST/ALT 74/41-->84/49  WBC down from 19.3-16.1--->14.9-->16  Bili 1.1  Imaging studies:  X-ray of back shows advanced chronic disc endplate degeneration F7-T0  X-ray left ring finger shows no evidence of fracture PIP soft tissue joint swelling  X-ray of left foot shows negative exam  MRI left foot shows no specific anomaly  ABI Summary: Bilateral ABIs are suggestive of mild arterial insufficiency at rest. However, given diminished waveforms these may be falsely elevated.  The right TBI is abnormal. The left TBI is  unable to be calculated due to absent waveform.  Medical tests:  Multiple  Test discussed with performing physician:  no  Decision to  obtain old records:  Yes  Review and summation of old records:  Yes  Scheduled Meds: . amoxicillin-clavulanate  1 tablet Oral Q12H  . B-complex with vitamin C  1 tablet Oral Daily  . diclofenac sodium  4 g Topical QID  . doxycycline  100 mg Oral Q12H  . enoxaparin (LOVENOX) injection  40 mg Subcutaneous Q24H  . feeding supplement (ENSURE ENLIVE)  237 mL Oral BID BM  . lactulose  10 g Oral TID  . multivitamin with minerals  1 tablet Oral Daily  . traZODone  100 mg Oral QHS  . triamcinolone cream   Topical BID   Continuous Infusions: . sodium chloride 100 mL/hr at 02/25/18 1458    Active Problems:   Hyponatremia   Pressure injury of skin   Fall   LOS: 4 days

## 2018-02-25 NOTE — Progress Notes (Signed)
CSW attempted to speak with patient regarding SNF placement- patient stated he did not want to discuss that at this time and asked CSW to speak with him tomorrow 10/16. CSW will follow up with patient tomorrow 10/16 regarding disposition plans.   [name], [name]  [phone]

## 2018-02-25 NOTE — Progress Notes (Signed)
Full note will follow Long discussion about EOL care and also Oncology impressions He needed 2 people to assist getting him up today His body is breaking down with multiple sores Not sure if he is a prime surgical candidate--appreciate in advance Vascular input  Will plan dispo to SNF once sodium improves a little more

## 2018-02-26 LAB — CBC
HCT: 42 % (ref 39.0–52.0)
Hemoglobin: 13.9 g/dL (ref 13.0–17.0)
MCH: 31.8 pg (ref 26.0–34.0)
MCHC: 33.1 g/dL (ref 30.0–36.0)
MCV: 96.1 fL (ref 80.0–100.0)
PLATELETS: 187 10*3/uL (ref 150–400)
RBC: 4.37 MIL/uL (ref 4.22–5.81)
RDW: 19.1 % — ABNORMAL HIGH (ref 11.5–15.5)
WBC: 15.5 10*3/uL — AB (ref 4.0–10.5)
nRBC: 0 % (ref 0.0–0.2)

## 2018-02-26 LAB — BASIC METABOLIC PANEL
ANION GAP: 6 (ref 5–15)
BUN: 30 mg/dL — ABNORMAL HIGH (ref 8–23)
CALCIUM: 8.2 mg/dL — AB (ref 8.9–10.3)
CO2: 22 mmol/L (ref 22–32)
Chloride: 100 mmol/L (ref 98–111)
Creatinine, Ser: 0.73 mg/dL (ref 0.61–1.24)
GFR calc Af Amer: >60 mL/min (ref >60)
GLUCOSE: 117 mg/dL — AB (ref 70–99)
Potassium: 5 mmol/L (ref 3.5–5.1)
SODIUM: 128 mmol/L — AB (ref 135–145)

## 2018-02-26 LAB — CULTURE, BLOOD (ROUTINE X 2)
CULTURE: NO GROWTH
CULTURE: NO GROWTH
Special Requests: ADEQUATE
Special Requests: ADEQUATE

## 2018-02-26 LAB — SODIUM, URINE, RANDOM

## 2018-02-26 LAB — OSMOLALITY, URINE: OSMOLALITY UR: 525 mosm/kg (ref 300–900)

## 2018-02-26 NOTE — Progress Notes (Addendum)
Physical Therapy Treatment Patient Details Name: Joshua [name] MRN: [id number] DOB: 1955/06/27 Today's Date: 02/26/2018    History of Present Illness 62 y.o. male with medical history significant of hepatocellular carcinoma, cirrhosis, alcoholism, hepatitis C, multiple other chronic medical problems presenting after a fall at home and diagnosed with toxic metabolic encephalopathy related either to liver cirrhosis versus metastatic liver disease from untreated hep C    PT Comments    Pt limited this session by widespread pain and frequent BMs. Pt participated in bed mobility tasks, requiring max assist for rolling and maintaining rolled position for pericare. Pt appeared discouraged this session and was difficult to motivate, deferring LE exercises or sitting EOB. Pt positioned so foot boots were aligned in neutral, resulting in neutral position of bilateral LEs. Pt with tendency to internally or externally rotate feet in supine position. Will continue to follow acutely.    Follow Up Recommendations  SNF     Equipment Recommendations  None recommended by PT    Recommendations for Other Services       Precautions / Restrictions Precautions Precautions: Fall Precaution Comments: fragile skin, sores on UEs  Restrictions Weight Bearing Restrictions: No    Mobility  Bed Mobility Overal bed mobility: Needs Assistance Bed Mobility: Rolling Rolling: Max assist;+2 for physical assistance;+2 for safety/equipment         General bed mobility comments: Pt unable to perform supine to sit or further mobility today due to constant BMs. PT rolled bilaterally multiple times to clean pt up after BM. Pt performed small bridging motion for placement of bedpad.   Transfers                    Ambulation/Gait                 Stairs             Wheelchair Mobility    Modified Rankin (Stroke Patients Only)       Balance Overall balance assessment: Needs  assistance;History of Falls                                          Cognition Arousal/Alertness: Awake/alert Behavior During Therapy: WFL for tasks assessed/performed Overall Cognitive Status: Within Functional Limits for tasks assessed                                        Exercises      General Comments        Pertinent Vitals/Pain Pain Assessment: Faces Faces Pain Scale: Hurts even more Pain Location: "all over", with mobility  Pain Descriptors / Indicators: Tender;Sore Pain Intervention(s): Limited activity within patient's tolerance;Repositioned;Monitored during session    Home Living                      Prior Function            PT Goals (current goals can now be found in the care plan section) Acute Rehab PT Goals PT Goal Formulation: With patient Time For Goal Achievement: 03/08/18 Potential to Achieve Goals: Good Progress towards PT goals: PT to reassess next treatment    Frequency    Min 3X/week      PT Plan Current plan remains appropriate    Co-evaluation  AM-PAC PT "6 Clicks" Daily Activity  Outcome Measure  Difficulty turning over in bed (including adjusting bedclothes, sheets and blankets)?: Unable Difficulty moving from lying on back to sitting on the side of the bed? : Unable Difficulty sitting down on and standing up from a chair with arms (e.g., wheelchair, bedside commode, etc,.)?: Unable Help needed moving to and from a bed to chair (including a wheelchair)?: A Lot Help needed walking in hospital room?: A Lot Help needed climbing 3-5 steps with a railing? : Total 6 Click Score: 8    End of Session Equipment Utilized During Treatment: Gait belt Activity Tolerance: Patient limited by fatigue Patient left: in bed;with call bell/phone within reach;with bed alarm set Nurse Communication: Mobility status PT Visit Diagnosis: Difficulty in walking, not elsewhere classified  (R26.2);Muscle weakness (generalized) (M62.81)     Time: 9622-2979 PT Time Calculation (min) (ACUTE ONLY): 25 min  Charges:  $Therapeutic Activity: 23-37 mins                     Julien Girt, PT Acute Rehabilitation Services Pager [phone]  Office [phone]   Tawny Raspberry D [place] 02/26/2018, 6:04 PM

## 2018-02-26 NOTE — Progress Notes (Signed)
PROGRESS NOTE    [name]  DUK:[id number] DOB: 03/19/1956 DOA: 02/21/2018 PCP: [name], MD   Brief Narrative: Joshua [name] is a 62 y.o. male with a history of liver cirrhosis secondary to hepatitis C and alcoholism, hepatocellular carcinoma stage II, alcohol abuse, hypertension.  Patient presented secondary to confusion and being found down.  He is found to have a sodium of 117 in setting of continued diuretic use.  Mental status has improved and sodium is also improved with IV fluids.   Assessment & Plan:   Active Problems:   Hyponatremia   Pressure injury of skin   Fall   Hyponatremia In setting of poor oral intake. Complicated by liver disease, however, he has responded to IV fluids and holding of lasix. Sodium of 128 currently -Continue IV fluids -Recheck sodium in AM -Urine sodium/osmolality  Toxic encephalopathy In setting of baclofen per chart review, however, patient started back on baclofen, so unsure. To date, he has received only one dose of baclofen. Concern for hepatic encephalopathy on admission, however, ammonia within normal limits. -Continue lactulose  Left fourth toe gangrene Patient with vascular disease leading to nonhealing ulcers on his leg in addition to left fourth toe gangrene.  Vascular surgery was consulted and recommended possible intervention pending patient's goals of care discussions. -Doxycycline/augmentin  Malnutrition In setting of chronic illness. Albumin of 1.6 in setting of liver disease. -Registered dietician  Neck rash Unknown etiology. -Continue steroid cream   DVT prophylaxis: Lovenox Code Status:   Code Status: Full Code Family Communication: None at bedside Disposition Plan: Discharge pending goals of care discussions   Consultants:   Vascular surgery  Palliative care medicine  Medical oncology/hematology  Procedures:   None  Antimicrobials:  Doxycycline    Subjective: Did not sleep well  overnight. Neck itching  Objective: Vitals:   02/25/18 1504 02/25/18 2026 02/26/18 0414 02/26/18 1000  BP: 114/85 97/81 100/80 98/79  Pulse: 89 84 85 89  Resp: 16 20 14    Temp: 98.2 F (36.8 C) (!) 97.3 F (36.3 C) (!) 97.4 F (36.3 C) (!) 97.1 F (36.2 C)  TempSrc: Oral Oral Oral Oral  SpO2: 95% 94% 94% 95%  Weight:      Height:        Intake/Output Summary (Last 24 hours) at 02/26/2018 1629 Last data filed at 02/26/2018 1500 Gross per 24 hour  Intake 4440.37 ml  Output 303 ml  Net 4137.37 ml   Filed Weights   02/21/18 1254  Weight: 55 kg    Examination:  General exam: Appears calm and comfortable Respiratory system: Clear to auscultation. Respiratory effort normal. Cardiovascular system: S1 & S2 heard, RRR. No murmurs, rubs, gallops or clicks. Gastrointestinal system: Abdomen is distended, soft and nontender. Normal bowel sounds heard. Central nervous system: Alert and oriented. No focal neurological deficits. Extremities: No edema. No calf tenderness Skin: No cyanosis. No rashes Psychiatry: Judgement and insight appear normal. Flat affect    Data Reviewed: I have personally reviewed following labs and imaging studies  CBC: Recent Labs  Lab 02/21/18 1236  02/22/18 0210 02/23/18 0251 02/24/18 0518 02/25/18 0419 02/26/18 1143  WBC 19.6*   < > 16.1* 14.9* 14.5* 16.1* 15.5*  NEUTROABS 16.1*  --   --  11.7* 11.5* 13.2*  --   HGB 15.6   < > 13.5 13.1 14.4 13.3 13.9  HCT 45.0   < > 39.5 38.1* 42.3 39.3 42.0  MCV 91.3   < > 92.5 92.5  93.4 94.5 96.1  PLT 250   < > 202 191 215 183 187   < > = values in this interval not displayed.   Basic Metabolic Panel: Recent Labs  Lab 02/21/18 1236  02/22/18 1051 02/23/18 0251 02/24/18 0518 02/25/18 0419 02/26/18 1143  NA 117*   < > 120* 121* 120* 126* 128*  K 5.4*   < > 4.7 4.4 4.5 4.8 5.0  CL 78*   < > 88* 92* 90* 97* 100  CO2 24   < > 20* 22 20* 21* 22  GLUCOSE 100*   < > 96 107* 144* 113* 117*  BUN 17   < >  12 13 16 23  30*  CREATININE 0.50*   < > 0.43* 0.40* 0.43* 0.59* 0.73  CALCIUM 8.4*   < > 8.0* 7.8* 8.1* 7.7* 8.2*  MG 2.0  --   --   --   --   --   --    < > = values in this interval not displayed.   GFR: Estimated Creatinine Clearance: 75.4 mL/min (by C-G formula based on SCr of 0.73 mg/dL). Liver Function Tests: Recent Labs  Lab 02/21/18 1236 02/22/18 0210 02/23/18 0251 02/24/18 0518 02/25/18 0419  AST 98* 78* 74* 82* 84*  ALT 48* 43 41 47* 49*  ALKPHOS 310* 270* 319* 392* 443*  BILITOT 1.8* 1.6* 1.4* 1.2 1.1  PROT 6.9 5.6* 5.3* 5.8* 5.4*  ALBUMIN 2.2* 1.8* 1.6* 1.8* 1.6*   Recent Labs  Lab 02/21/18 1236  LIPASE 30   Recent Labs  Lab 02/21/18 1657  AMMONIA 22   Coagulation Profile: Recent Labs  Lab 02/21/18 1500 02/23/18 0251  INR 1.21 1.25   Cardiac Enzymes: No results for input(s): CKTOTAL, CKMB, CKMBINDEX, TROPONINI in the last 168 hours. BNP (last 3 results) No results for input(s): PROBNP in the last 8760 hours. HbA1C: No results for input(s): HGBA1C in the last 72 hours. CBG: No results for input(s): GLUCAP in the last 168 hours. Lipid Profile: No results for input(s): CHOL, HDL, LDLCALC, TRIG, CHOLHDL, LDLDIRECT in the last 72 hours. Thyroid Function Tests: No results for input(s): TSH, T4TOTAL, FREET4, T3FREE, THYROIDAB in the last 72 hours. Anemia Panel: No results for input(s): VITAMINB12, FOLATE, FERRITIN, TIBC, IRON, RETICCTPCT in the last 72 hours. Sepsis Labs: No results for input(s): PROCALCITON, LATICACIDVEN in the last 168 hours.  Recent Results (from the past 240 hour(s))  MRSA PCR Screening     Status: None   Collection Time: 02/21/18  4:00 PM  Result Value Ref Range Status   MRSA by PCR NEGATIVE NEGATIVE Final    Comment:        The GeneXpert MRSA Assay (FDA approved for NASAL specimens only), is one component of a comprehensive MRSA colonization surveillance program. It is not intended to diagnose MRSA infection nor to guide  or monitor treatment for MRSA infections. Performed at [facility], [place] [address], [place], [place] [address]   Culture, blood (routine x 2)     Status: None   Collection Time: 02/21/18  5:02 PM  Result Value Ref Range Status   Specimen Description   Final    BLOOD RIGHT ANTECUBITAL Performed at [facility] [address] [place], [place] [address]    Special Requests   Final    BOTTLES DRAWN AEROBIC AND ANAEROBIC Blood Culture adequate volume Performed at [facility] [address] [place], [place] [address]    Culture  Final    NO GROWTH 5 DAYS Performed at [facility], [place] [address], [place], [place] [address]    Report Status 02/26/2018 FINAL  Final  Culture, blood (routine x 2)     Status: None   Collection Time: 02/21/18  5:03 PM  Result Value Ref Range Status   Specimen Description   Final    BLOOD RIGHT HAND Performed at [facility] [address], [place], [place] [address]    Special Requests   Final    BOTTLES DRAWN AEROBIC ONLY Blood Culture adequate volume Performed at [facility] [address] [place], [place] [address]    Culture   Final    NO GROWTH 5 DAYS Performed at [facility], [place] [address] [place], [place] [address]    Report Status 02/26/2018 FINAL  Final         Radiology Studies: No results found.      Scheduled Meds: . amoxicillin-clavulanate  1 tablet Oral Q12H  . B-complex with vitamin C  1 tablet Oral Daily  . diclofenac sodium  4 g Topical QID  . doxycycline  100 mg Oral Q12H  . enoxaparin (LOVENOX) injection  40 mg Subcutaneous Q24H  . feeding supplement (ENSURE ENLIVE)  237 mL Oral BID BM  . lactulose  10 g Oral TID  . multivitamin with minerals  1 tablet Oral Daily  . traZODone  100 mg Oral QHS  . triamcinolone cream   Topical BID   Continuous Infusions: . sodium chloride 100 mL/hr at 02/26/18  1500     LOS: 5 days     Cordelia Poche, MD [facility] 02/26/2018, 4:29 PM  If 7PM-7AM, please contact night-coverage [email or web address]

## 2018-02-26 NOTE — Consult Note (Signed)
Consultation Note Date: 02/27/2018   Patient Name: Joshua [name]  DOB: 27-Nov-1955  MRN: [id number]  Age / Sex: 62 y.o., male  PCP: Joshua Post, MD Referring Physician: Mariel Aloe, MD  Reason for Consultation: Establishing goals of care  HPI/Patient Profile: 62 y.o. male  with past medical history of hepatocellular carcinoma (on chemotherapy), hepatitis C, cirrhosis, and alcohol abuse who was admitted on 02/21/2018 after a fall with encephalopathy.  On admission he was profoundly hyponatremic (117) and had ulcerations on multiple digits and his sacrum.   He has been followed by Vascular Surgery and Oncology.  He is not a good surgical candidate and is unable to receive chemo or palliative immunotherapy at this point.  Clinical Assessment and Goals of Care:  I have reviewed medical records including EPIC notes, labs and imaging, received report from the care team, assessed the patient and then spoke on the phone with his mother and step father to discuss diagnosis prognosis, GOC, EOL wishes, disposition and options.  I introduced Palliative Medicine as specialized medical care for people living with serious illness. It focuses on providing relief from the symptoms and stress of a serious illness. The goal is to improve quality of life for both the patient and the family.  We discussed a brief life review of the patient.  He was living at home alone prior to admission.  He has no children and it appears his closest family are his mother and step father.  He has fallen three times recently.  He would not allow his mother into his home because it was soiled.  As far as functional and nutritional status he is very weak needing help at this point to even roll over in bed.  He is unable to perform ADLs and is completely dependent.  He is having continuous "leaking" of diarrhea - likely due to medications  (lactulose and augmentin) as well as liver disease.  He is eating very poorly.  Current albumin is 1.6.  He requires frequent paracentesis.   We discussed their current illness and what it means in the larger context of their on-going co-morbidities.  Natural disease trajectory and expectations at EOL were discussed.  Mrs. Joshua [name] was surprised at her son's prognosis.  She feels this has all happened so fast.  We talked about his very poor functional status and discussed his liver disease.  I attempted to elicit values and goals of care important to the patient.  Mr. Joshua [name] is confused but tells me he feels he is getting better.  He is hopeful to continue to improve.  Advanced directives, concepts specific to code status, artifical feeding and hydration, and rehospitalization were considered and discussed.  Mrs. Joshua [name] and Dr. Elta [name] are agreeable to DNR.  They do not want him to lay in bed and suffer.  They do not want him to have surgery.  They understand there are not options for oncology treatment at this time.   They want him to be comfortable and well cared for.  They asked for him to go to [facility] on discharge as it is close to their home.  Hospice and Palliative Care services outpatient were explained and offered.  Mrs. And Dr. Luetta [name] would like for Joshua [name] to be followed by Palliative Care services at [facility].    Questions and concerns were addressed.  The family was encouraged to call with questions or concerns.    Primary Decision Maker:  NEXT OF KIN Patient's mother    SUMMARY OF RECOMMENDATIONS    Code status changed to DNR per Mother and Step father Parents request D/C to SNF with Palliative to follow when medically appropriate. Lactulose decreased to 10 g once daily (rather than TID) I feel Mr. Joshua [name] would be best served by comfort care and Hospice services when he and his family are ready to accept them.  Code Status/Advance Care Planning:  DNR   Symptom Management:    Decreased lactulose.  Consider changing antibiotic if you feel it may decrease his diarrhea.  Additional Recommendations (Limitations, Scope, Preferences):  Avoid Hospitalization, Minimize Medications and No Surgical Procedures   Psycho-social/Spiritual:   Desire for further Chaplaincy support:  He is [place].  Chaplain visit would be supportive and reassuring in this difficult and confusing time for Mr. Joshua [name]  Prognosis: less than 6 months.  Perhaps much less.  He has hepatocellular carcinoma with no further oncological options.  He has recurrent encephalopathy and is requiring frequent paracentesis.  His nutritional status is extremely poor and at this point he is bed bound.  He has necrosis on he left toe and ulcerations elsewhere.  Family does not want surgery.  I am concerned he will return to the hospital quickly.  When he does he will most likely be an excellent candidate for Hospice House services.   Discharge Planning: [facility] for rehab with Palliative care service follow-up      Primary Diagnoses: Present on Admission: . Hyponatremia   I have reviewed the medical record, interviewed the patient and family, and examined the patient. The following aspects are pertinent.  Past Medical History:  Diagnosis Date  . Cancer ([place])   . Cataract   . Chronic alcoholism ([place])   . Cirrhosis ([place])   . Depression   . Glucose intolerance (impaired glucose tolerance)   . Hepatitis C   . Hypertension   . Tobacco use   . Umbilical hernia    Social History   Socioeconomic History  . Marital status: Single    Spouse name: Not on file  . Number of children: Not on file  . Years of education: Not on file  . Highest education level: Not on file  Occupational History  . Not on file  Social Needs  . Financial resource strain: Not on file  . Food insecurity:    Worry: Not on file    Inability: Not on file  . Transportation needs:    Medical: Not on file     Non-medical: Not on file  Tobacco Use  . Smoking status: Former Smoker    Packs/day: 0.50    Years: 42.00    Pack years: 21.00    Types: Cigarettes    Start date: 05/21/1971    Last attempt to quit: 07/28/2017    Years since quitting: 0.5  . Smokeless tobacco: Former Systems developer  . Tobacco comment: Has cut down to about 1/3 pack per day  Substance and Sexual Activity  . Alcohol use: No    Alcohol/week: 28.0 standard drinks  Types: 28 Cans of beer per week    Comment: hx of nothing current   . Drug use: No  . Sexual activity: Not on file  Lifestyle  . Physical activity:    Days per week: Not on file    Minutes per session: Not on file  . Stress: Not on file  Relationships  . Social connections:    Talks on phone: Not on file    Gets together: Not on file    Attends religious service: Not on file    Active member of club or organization: Not on file    Attends meetings of clubs or organizations: Not on file    Relationship status: Not on file  Other Topics Concern  . Not on file  Social History Narrative  . Not on file   Family History  Problem Relation Age of Onset  . Healthy Mother   . Colon cancer Neg Hx   . Stomach cancer Neg Hx   . Esophageal cancer Neg Hx   . Rectal cancer Neg Hx    Scheduled Meds: . amoxicillin-clavulanate  1 tablet Oral Q12H  . B-complex with vitamin C  1 tablet Oral Daily  . diclofenac sodium  4 g Topical QID  . doxycycline  100 mg Oral Q12H  . enoxaparin (LOVENOX) injection  40 mg Subcutaneous Q24H  . feeding supplement (ENSURE ENLIVE)  237 mL Oral BID BM  . lactulose  10 g Oral TID  . multivitamin with minerals  1 tablet Oral Daily  . traZODone  100 mg Oral QHS  . triamcinolone cream   Topical BID   Continuous Infusions: . sodium chloride 100 mL/hr at 02/26/18 1126   PRN Meds:.acetaminophen, baclofen, HYDROcodone-acetaminophen No Known Allergies Review of Systems patient confused.  Physical Exam  Well developed chronically ill  appearing male, awake but lethargic and confused. CV rrr Resp no w/c/r, no distress Abdomen distended, non-tender   Vital Signs: BP 98/79 (BP Location: Right Arm)   Pulse 89   Temp (!) 97.1 F (36.2 C) (Oral)   Resp 14   Ht 5\' 11"  (1.803 m)   Wt 55 kg   SpO2 95%   BMI 16.91 kg/m  Pain Scale: Faces POSS *See Group Information*: 1-Acceptable,Awake and alert Pain Score: 6    SpO2: SpO2: 95 % O2 Device:SpO2: 95 % O2 Flow Rate: .   IO: Intake/output summary:   Intake/Output Summary (Last 24 hours) at 02/26/2018 1326 Last data filed at 02/26/2018 0700 Gross per 24 hour  Intake 3705.34 ml  Output 303 ml  Net 3402.34 ml    LBM: Last BM Date: 02/24/18 Baseline Weight: Weight: 55 kg Most recent weight: Weight: 55 kg     Palliative Assessment/Data: 20%     Time In: 8:00 Time Out: 9:20 Time Total: 80 min. Greater than 50%  of this time was spent counseling and coordinating care related to the above assessment and plan.  Signed by: Florentina Jenny, PA-C Palliative Medicine Pager: [phone]  Please contact Palliative Medicine Team phone at [phone] for questions and concerns.  For individual provider: See Shea Evans

## 2018-02-26 NOTE — Progress Notes (Signed)
Joshua [name]   DOB:June 06, 1955   ZW#:[id number]   [id number]#:[id number]  ONCOLOGY FOLLOW UP  Subjective: Patient remains to be very weak, eats very little, complains body pain all over, but states his pain is controlled by pain meds. I also spoke with his mother and step-father in his room when they were visiting   Objective:  Vitals:   02/26/18 1000 02/26/18 1500  BP: 98/79 109/76  Pulse: 89 87  Resp:  (!) 8  Temp: (!) 97.1 F (36.2 C) 97.6 F (36.4 C)  SpO2: 95% 97%    Body mass index is 16.91 kg/m.  Intake/Output Summary (Last 24 hours) at 02/26/2018 1731 Last data filed at 02/26/2018 1500 Gross per 24 hour  Intake 2497.24 ml  Output 303 ml  Net 2194.24 ml     Sclerae unicteric  Oropharynx clear  No peripheral adenopathy  Lungs clear -- no rales or rhonchi  Heart regular rate and rhythm  Abdomen benign  MSK no focal spinal tenderness, no peripheral edema  Neuro nonfocal, he is wake and alert but answers questions slowly     CBG (last 3)  No results for input(s): GLUCAP in the last 72 hours.   Labs:  Lab Results  Component Value Date   WBC 15.5 (H) 02/26/2018   HGB 13.9 02/26/2018   HCT 42.0 02/26/2018   MCV 96.1 02/26/2018   PLT 187 02/26/2018   NEUTROABS 13.2 (H) 02/25/2018   / CMP Latest Ref Rng & Units 02/26/2018 02/25/2018 02/24/2018  Glucose 70 - 99 mg/dL 117(H) 113(H) 144(H)  BUN 8 - 23 mg/dL 30(H) 23 16  Creatinine 0.61 - 1.24 mg/dL 0.73 0.59(L) 0.43(L)  Sodium 135 - 145 mmol/L 128(L) 126(L) 120(L)  Potassium 3.5 - 5.1 mmol/L 5.0 4.8 4.5  Chloride 98 - 111 mmol/L 100 97(L) 90(L)  CO2 22 - 32 mmol/L 22 21(L) 20(L)  Calcium 8.9 - 10.3 mg/dL 8.2(L) 7.7(L) 8.1(L)  Total Protein 6.5 - 8.1 g/dL - 5.4(L) 5.8(L)  Total Bilirubin 0.3 - 1.2 mg/dL - 1.1 1.2  Alkaline Phos 38 - 126 U/L - 443(H) 392(H)  AST 15 - 41 U/L - 84(H) 82(H)  ALT 0 - 44 U/L - 49(H) 47(H)     Urine Studies No results for input(s): UHGB, CRYS in the last 72 hours.  Invalid input(s):  UACOL, UAPR, USPG, UPH, UTP, UGL, UKET, UBIL, UNIT, UROB, [place], UEPI, UWBC, [place], [place], [place], [place], [place]  Basic Metabolic Panel: Recent Labs  Lab 02/21/18 1236  02/22/18 1051 02/23/18 0251 02/24/18 0518 02/25/18 0419 02/26/18 1143  NA 117*   < > 120* 121* 120* 126* 128*  K 5.4*   < > 4.7 4.4 4.5 4.8 5.0  CL 78*   < > 88* 92* 90* 97* 100  CO2 24   < > 20* 22 20* 21* 22  GLUCOSE 100*   < > 96 107* 144* 113* 117*  BUN 17   < > 12 13 16 23  30*  CREATININE 0.50*   < > 0.43* 0.40* 0.43* 0.59* 0.73  CALCIUM 8.4*   < > 8.0* 7.8* 8.1* 7.7* 8.2*  MG 2.0  --   --   --   --   --   --    < > = values in this interval not displayed.   GFR Estimated Creatinine Clearance: 75.4 mL/min (by C-G formula based on SCr of 0.73 mg/dL). Liver Function Tests: Recent Labs  Lab 02/21/18 1236 02/22/18 0210 02/23/18 0251 02/24/18 0518 02/25/18 4008  AST 98* 78* 74* 82* 84*  ALT 48* 43 41 47* 49*  ALKPHOS 310* 270* 319* 392* 443*  BILITOT 1.8* 1.6* 1.4* 1.2 1.1  PROT 6.9 5.6* 5.3* 5.8* 5.4*  ALBUMIN 2.2* 1.8* 1.6* 1.8* 1.6*   Recent Labs  Lab 02/21/18 1236  LIPASE 30   Recent Labs  Lab 02/21/18 1657  AMMONIA 22   Coagulation profile Recent Labs  Lab 02/21/18 1500 02/23/18 0251  INR 1.21 1.25    CBC: Recent Labs  Lab 02/21/18 1236  02/22/18 0210 02/23/18 0251 02/24/18 0518 02/25/18 0419 02/26/18 1143  WBC 19.6*   < > 16.1* 14.9* 14.5* 16.1* 15.5*  NEUTROABS 16.1*  --   --  11.7* 11.5* 13.2*  --   HGB 15.6   < > 13.5 13.1 14.4 13.3 13.9  HCT 45.0   < > 39.5 38.1* 42.3 39.3 42.0  MCV 91.3   < > 92.5 92.5 93.4 94.5 96.1  PLT 250   < > 202 191 215 183 187   < > = values in this interval not displayed.   Cardiac Enzymes: No results for input(s): CKTOTAL, CKMB, CKMBINDEX, TROPONINI in the last 168 hours. BNP: Invalid input(s): POCBNP CBG: No results for input(s): GLUCAP in the last 168 hours. D-Dimer No results for input(s): DDIMER in the last 72 hours. Hgb A1c No  results for input(s): HGBA1C in the last 72 hours. Lipid Profile No results for input(s): CHOL, HDL, LDLCALC, TRIG, CHOLHDL, LDLDIRECT in the last 72 hours. Thyroid function studies No results for input(s): TSH, T4TOTAL, T3FREE, THYROIDAB in the last 72 hours.  Invalid input(s): FREET3 Anemia work up No results for input(s): VITAMINB12, FOLATE, FERRITIN, TIBC, IRON, RETICCTPCT in the last 72 hours. Microbiology Recent Results (from the past 240 hour(s))  MRSA PCR Screening     Status: None   Collection Time: 02/21/18  4:00 PM  Result Value Ref Range Status   MRSA by PCR NEGATIVE NEGATIVE Final    Comment:        The GeneXpert MRSA Assay (FDA approved for NASAL specimens only), is one component of a comprehensive MRSA colonization surveillance program. It is not intended to diagnose MRSA infection nor to guide or monitor treatment for MRSA infections. Performed at [facility], [place] [address] [place], [place] [address]   Culture, blood (routine x 2)     Status: None   Collection Time: 02/21/18  5:02 PM  Result Value Ref Range Status   Specimen Description   Final    BLOOD RIGHT ANTECUBITAL Performed at [facility] [address], [place], [place] [address]    Special Requests   Final    BOTTLES DRAWN AEROBIC AND ANAEROBIC Blood Culture adequate volume Performed at [facility] [address] [place], [place] [address]    Culture   Final    NO GROWTH 5 DAYS Performed at [facility], [place] [address] [place], [place] [address]    Report Status 02/26/2018 FINAL  Final  Culture, blood (routine x 2)     Status: None   Collection Time: 02/21/18  5:03 PM  Result Value Ref Range Status   Specimen Description   Final    BLOOD RIGHT HAND Performed at [facility] [address], [place], [place] [address]    Special Requests   Final    BOTTLES DRAWN AEROBIC ONLY Blood Culture adequate  volume Performed at [facility]  [address], [place], [place] [address]    Culture   Final    NO GROWTH 5 DAYS Performed at [facility], [place] [address], [place], [place] [address]    Report Status 02/26/2018 FINAL  Final      Studies:  No results found.  Assessment: 62 y.o. with PMH of hepatocellular carcinoma, liver cirrhosis, hepatitis C, alcoholism, presented to hospital after a fall at home, along with progressive weakness.  1. Progressive weakness and fall at home 2.hyponatremia, improved  3.  Multifocal hepatocellular carcinoma 4. Ascites  5. Liver cirrhosis  6. Treated hep C 7. History of alcohol abuse  8. HTN  9.  Progressive weakness 10.  Anorexia 11. Fall at home and deconditioning  12. Necrotic left 4th toe    Plan:  -I discussed the overall very poor prognosis due to his underlying liver cancer and liver cirrhosis, and unfortunately I do not have good treatment options for his cancer, due to his extremely poor performance status.  -We discussed his left toe necrosis, and possible amputation.  Given his underlying poor prognosis, he is likely going to hospice, I do not feel vascular surgery and amputation would help him. Pt agrees.  -I recommend SNP placement and hospice care at SNF. We can also ask hospice team to evaluate him to see if he qualify residential hospice  -palliative care consult has been called  -I spoke with Dr. Lonny Prude today and he agrees with the plan. He will let SW to discuss with pt and his mother about SNF options.  -I will f/u   Truitt Merle, MD 02/26/2018  5:31 PM

## 2018-02-27 DIAGNOSIS — Z66 Do not resuscitate: Secondary | ICD-10-CM

## 2018-02-27 DIAGNOSIS — L97509 Non-pressure chronic ulcer of other part of unspecified foot with unspecified severity: Secondary | ICD-10-CM

## 2018-02-27 DIAGNOSIS — E43 Unspecified severe protein-calorie malnutrition: Secondary | ICD-10-CM

## 2018-02-27 DIAGNOSIS — Z515 Encounter for palliative care: Secondary | ICD-10-CM

## 2018-02-27 LAB — BASIC METABOLIC PANEL
ANION GAP: 8 (ref 5–15)
BUN: 31 mg/dL — ABNORMAL HIGH (ref 8–23)
CALCIUM: 7.7 mg/dL — AB (ref 8.9–10.3)
CO2: 17 mmol/L — AB (ref 22–32)
Chloride: 101 mmol/L (ref 98–111)
Creatinine, Ser: 0.7 mg/dL (ref 0.61–1.24)
GFR calc Af Amer: >60 mL/min (ref >60)
GFR calc non Af Amer: >60 mL/min (ref >60)
GLUCOSE: 113 mg/dL — AB (ref 70–99)
POTASSIUM: 4.9 mmol/L (ref 3.5–5.1)
Sodium: 126 mmol/L — ABNORMAL LOW (ref 135–145)

## 2018-02-27 MED ORDER — LACTULOSE 10 GM/15ML PO SOLN
10.0000 g | Freq: Every day | ORAL | Status: DC
  Administered 2018-02-28 – 2018-03-04 (×5): 10 g via ORAL
  Filled 2018-02-27 (×5): qty 15

## 2018-02-27 NOTE — Progress Notes (Signed)
Initial Nutrition Assessment  DOCUMENTATION CODES:   Severe malnutrition in context of chronic illness, Underweight  INTERVENTION:   Continue Ensure Enlive po BID, each supplement provides 350 kcal and 20 grams of protein  NUTRITION DIAGNOSIS:   Severe Malnutrition related to chronic illness, cancer and cancer related treatments as evidenced by percent weight loss, severe fat depletion, severe muscle depletion.  GOAL:   Patient will meet greater than or equal to 90% of their needs  MONITOR:   PO intake, Supplement acceptance, Labs, Weight trends, Skin, I & O's  REASON FOR ASSESSMENT:   Consult Assessment of nutrition requirement/status  ASSESSMENT:   62 y.o. male with a history of liver cirrhosis secondary to hepatitis C and alcoholism, hepatocellular carcinoma stage II, alcohol abuse, hypertension.  Patient presented secondary to confusion and being found down.   Pt was last seen by [place] on 9/17. At that time, pt was cooking for himself and had occasional poor appetite.   Patient in room with no family at bedside. Pt lethargic, kept closing his eyes when speaking with him. Pt with applesauce, Boost Breeze and some coffee at bedside. Pt states he does not like the Boost Breeze and prefers the Ensure supplements to drink. Pt not eating well and this has been the case PTA.   Patient is currently bedbound. Has boots on his bilateral LEs for wounds. Per Palliative care note, pt is not a candidate for further cancer treatment. Suggested pt be comfort care but family would like him to d/c to SNF.   Per weight records, pt has lost 34 lb since March 2019 (22% wt loss x 7 months, significant for time frame).  Medications: B-complex w/ Vitamin C tablet daily, Multivitamin with minerals daily  NUTRITION - FOCUSED PHYSICAL EXAM:    Most Recent Value  Orbital Region  Mild depletion  Upper Arm Region  Severe depletion  Thoracic and Lumbar Region  Unable to assess  Buccal  Region  Moderate depletion  Temple Region  Severe depletion  Clavicle Bone Region  Severe depletion  Clavicle and Acromion Bone Region  Severe depletion  Scapular Bone Region  Severe depletion  Dorsal Hand  Severe depletion  Patellar Region  Unable to assess  Anterior Thigh Region  Unable to assess  Posterior Calf Region  Unable to assess [boots]  Edema (RD Assessment)  Unable to assess  Skin  Reviewed [extremely dry]       Diet Order:   Diet Order            Diet regular Room service appropriate? Yes; Fluid consistency: Thin  Diet effective now              EDUCATION NEEDS:   Not appropriate for education at this time  Skin:  Skin Assessment: Skin Integrity Issues: Skin Integrity Issues:: Stage III, Other (Comment) Stage III: sacrum Other: left necrotic toe, left leg  Last BM:  10/17  Height:   Ht Readings from Last 1 Encounters:  02/21/18 5\' 11"  (1.803 m)    Weight:   Wt Readings from Last 1 Encounters:  02/21/18 55 kg    Ideal Body Weight:  78.2 kg  BMI:  Body mass index is 16.91 kg/m.  Estimated Nutritional Needs:   Kcal:  1550-1750  Protein:  70-80g  Fluid:  1.7L/day   Clayton Bibles, MS, RD, LDN [place] Dietitian Pager: [phone] After Hours Pager: [phone]

## 2018-02-27 NOTE — Plan of Care (Signed)
Pt alert and oriented, very weak. Daily dressing changes per orders done. Plan of care discussed with pt. RN will monitor.

## 2018-02-27 NOTE — NC FL2 (Signed)
[place] LEVEL OF CARE SCREENING TOOL     IDENTIFICATION  Patient Name: Joshua [name] Birthdate: 12/02/1955 Sex: male Admission Date (Current Location): 02/21/2018  [facility] and [place] Number:  Herbalist and Address:  [facility],  [place], [place]      Provider Number: [id number]  Attending Physician Name and Address:  Mariel Aloe, MD  Relative Name and Phone Number:  Marcia Brash: [phone]    Current Level of Care: Hospital Recommended Level of Care: [place] Prior Approval Number:    Date Approved/Denied:   PASRR Number: Pending  Discharge Plan: SNF    Current Diagnoses: Patient Active Problem List   Diagnosis Date Noted  . Ulcer of toe ([place])   . DNR (do not resuscitate)   . Palliative care encounter   . Hyponatremia 02/21/2018  . Pressure injury of skin 02/21/2018  . Fall   . Decubitus ulcer of buttock, stage 2 ([place]) 02/05/2018  . Protein calorie malnutrition ([place]) 02/05/2018  . Hepatocellular carcinoma ([place]) 08/24/2016  . Chronic hepatitis C ([place]) 10/05/2014  . Ganglion cyst of wrist 12/30/2013  . ANXIETY DEPRESSION 05/25/2010  . COPD (chronic obstructive pulmonary disease) ([place]) 09/16/2009  . ESOPHAGEAL REFLUX 09/16/2009  . TESTICULAR MASS, LEFT 09/16/2009  . PAIN IN JOINT, MULTIPLE SITES 09/16/2009  . DIARRHEA 09/16/2009  . HEPATOMEGALY 09/16/2009  . CIRRHOSIS, ALCOHOLIC 15/17/6160  . ALCOHOL WITHDRAWAL 02/02/2009  . CONSTIPATION 02/02/2009  . ABDOMINAL PAIN, GENERALIZED 01/31/2009  . WEIGHT LOSS, ABNORMAL 01/26/2009  . ANXIETY DISORDER 03/12/2008  . Essential hypertension 10/31/2006    Orientation RESPIRATION BLADDER Height & Weight     Self, Time, Situation, Place  Normal External catheter Weight: 121 lb 4.1 oz (55 kg) Height:  5\' 11"  (180.3 cm)  BEHAVIORAL SYMPTOMS/MOOD NEUROLOGICAL BOWEL NUTRITION STATUS      Incontinent Diet(Regular)  AMBULATORY STATUS  COMMUNICATION OF NEEDS Skin   Extensive Assist Verbally PU Stage and Appropriate Care    Two full thickness Unstageable pressure injuries to coccygeal area, necrotic 4th toe on left foot, full thickness wound on lateral aspect of left LE. Wound type: Pressure, circulatory vs trauma Pressure Injury POA: Yes Measurement: Coccygeal wounds are embedded within a 9cm x 7cm area of erythema. Right measures 3cm x 2.2cm left measures 2.5cm x 2cm.  Both areas have their depth obscured by the presence of thin, white slough. Minimal exudate (serous) Wound bed:As described above Drainage (amount, consistency, odor) As described above Periwound: erythema, blanching Dressing procedure/placement/frequency: Patient with nonviable 4th toe on left foot, recommend vascular or Orthopedic surgery consult.   Conservative care orders using betadine swab stick application to keep the area dry and infection free as long as possible are provided. Pressure redistribution heel boots and a chair pad are provided for patient. Topical care for POC pressure injuries to coccygeal area are provided using a calcium alginate. Turning and repositioning are an essential part of this patient's POC                   Personal Care Assistance Level of Assistance  Bathing, Feeding, Dressing Bathing Assistance: Maximum assistance Feeding assistance: Limited assistance Dressing Assistance: Maximum assistance     Functional Limitations Info  Sight, Hearing, Speech Sight Info: Adequate Hearing Info: Adequate Speech Info: Adequate    SPECIAL CARE FACTORS FREQUENCY  PT (By licensed PT), OT (By licensed OT)  Contractures Contractures Info: Not present    Additional Factors Info  Code Status, Allergies Code Status Info: DNR Allergies Info: NKA           Current Medications (02/27/2018):  This is the current hospital active medication list Current Facility-Administered Medications  Medication  Dose Route Frequency Provider Last Rate Last Dose  . 0.9 %  sodium chloride infusion   Intravenous Continuous Nita Sells, MD 100 mL/hr at 02/27/18 0906    . acetaminophen (TYLENOL) tablet 500 mg  500 mg Oral Q4H PRN Nita Sells, MD   500 mg at 02/24/18 0421  . amoxicillin-clavulanate (AUGMENTIN) 875-125 MG per tablet 1 tablet  1 tablet Oral Q12H Nita Sells, MD   1 tablet at 02/27/18 0905  . B-complex with vitamin C tablet 1 tablet  1 tablet Oral Daily Nita Sells, MD   1 tablet at 02/27/18 0906  . baclofen (LIORESAL) tablet 5-10 mg  5-10 mg Oral Q8H PRN Nita Sells, MD   10 mg at 02/24/18 1455  . diclofenac sodium (VOLTAREN) 1 % transdermal gel 4 g  4 g Topical QID Rise Patience, MD   4 g at 02/27/18 0936  . doxycycline (VIBRA-TABS) tablet 100 mg  100 mg Oral Q12H Nita Sells, MD   100 mg at 02/27/18 0905  . enoxaparin (LOVENOX) injection 40 mg  40 mg Subcutaneous Q24H Nita Sells, MD   40 mg at 02/26/18 2140  . feeding supplement (ENSURE ENLIVE) (ENSURE ENLIVE) liquid 237 mL  237 mL Oral BID BM Nita Sells, MD   237 mL at 02/27/18 0935  . HYDROcodone-acetaminophen (NORCO/VICODIN) 5-325 MG per tablet 1-2 tablet  1-2 tablet Oral Q4H PRN Nita Sells, MD   2 tablet at 02/27/18 0905  . [START ON 02/28/2018] lactulose (CHRONULAC) 10 GM/15ML solution 10 g  10 g Oral Daily Dellinger, Marianne L, PA-C      . multivitamin with minerals tablet 1 tablet  1 tablet Oral Daily Nita Sells, MD   1 tablet at 02/27/18 0905  . traZODone (DESYREL) tablet 100 mg  100 mg Oral QHS Nita Sells, MD   100 mg at 02/26/18 2140  . triamcinolone cream (KENALOG) 0.5 %   Topical BID Nita Sells, MD         Discharge Medications: Please see discharge summary for a list of discharge medications.  Relevant Imaging Results:  Relevant Lab Results:   Additional Information SSN: [id number]. Needs palliative  care to follow.  Pricilla Holm, LCSW

## 2018-02-27 NOTE — Clinical Social Work Note (Addendum)
CSW provided bed offers to pt's mother- ([name] was their preference but unable to accept due to [employer]) Mother states she and stepfather will research facilities to make choices and get back to CSW as soon as possible as insurance authorization request will be initiated by selected facility  Clinical Social Work Assessment  Patient Details  Name: Joshua [name] MRN: [id number] Date of Birth: [birth year]  Date of referral:  02/27/18               Reason for consult:  Discharge Planning, Facility Placement                Permission sought to share information with:  Family Supports Permission granted to share information::  Yes, Verbal Permission Granted  Name::     [name]  Agency::     Relationship::     Contact Information:     Housing/Transportation Living arrangements for the past 2 months:  [place] of Information:  Medical Team, Palliative Care Team, Parent Patient Interpreter Needed:  None Criminal Activity/Legal Involvement Pertinent to Current Situation/Hospitalization:  No - Comment as needed Significant Relationships:  Parents Lives with:  Self Do you feel safe going back to the place where you live?  Yes Need for family participation in patient care:  Yes (Comment)(Pt not participating in [place] with [place]- defers to mother)  Care giving concerns:  Pt admitted from home where he resides alone- has good support from family and neighbors.  At baseline ambulated independently- used rolling walker at times. Pt has liver ca and cirrhosis- hx of alcohol use. Poor prognosis per evaluations.  Pt has several wounds and necrotic toe. Has fallen multiple times at home and no longer able to complete ADLs. Nutritional intake currently poor as well.   Social Worker assessment / plan:  CSW consulted to assist with SNF placement.  Pt prefers to return home however admits having no care available to meet his advanced needs. Has deferred to mother and  stepfather for decision- they want pt to admit to SNF, preferable [name], for therapy and wound care. Understand that even with rehab pt's prognosis is poor. Want palliative care to follow to continue assessing needs.  CSW submitted for pasrr- went to manual review. Will provide addt'l info when requested by [place], Pt will not be able to admit to a SNF until pasrr approved. CSW completed FL2 and referrals.  Explained insurance prior authorization process to pt's mother- understanding.  Plan: Pursuing SNF at [place]- awaiting bed offers and will need prior authorization from [facility] once facility selected. Additional barrier is Research officer, trade union pending.    Employment status:    Insurance information:    PT Recommendations:  [place] / Referral to community resources:  [place]  Patient/Family's Response to care:  Pt angry and would not engage with CSW. Mother expressed appreciation.   Patient/Family's Understanding of and Emotional Response to Diagnosis, Current Treatment, and Prognosis:  UTA pt's understanding- currently yelling at caretaker and did not engage with CSW. Mother showed good understanding of conversations had thus far.   Emotional Assessment Appearance:  Appears stated age Attitude/Demeanor/Rapport:  Angry Affect (typically observed):  Angry Orientation:  Oriented to Self, Oriented to Place, Oriented to  Time, Oriented to Situation Alcohol / Substance use:  Not Applicable Psych involvement (Current and /or in the community):  No (Comment)  Discharge Needs  Concerns to be addressed:  Discharge Planning Concerns Readmission within the  last 30 days:  No Current discharge risk:  Chronically ill, Dependent with [employer], Lives alone Barriers to Discharge:  Ship broker, pasrr approval   Joshua Nephew, LCSW 02/27/2018, 10:53 AM [phone]

## 2018-02-27 NOTE — Progress Notes (Signed)
PROGRESS NOTE    Joshua [name]  UTM:[id number] DOB: 12-13-1955 DOA: 02/21/2018 PCP: [name], MD   Brief Narrative: Joshua [name] is a 62 y.o. male with a history of liver cirrhosis secondary to hepatitis C and alcoholism, hepatocellular carcinoma stage II, alcohol abuse, hypertension.  Patient presented secondary to confusion and being found down.  He is found to have a sodium of 117 in setting of continued diuretic use.  Mental status has improved and sodium is also improved with IV fluids.   Assessment & Plan:   Active Problems:   Essential hypertension   CIRRHOSIS, ALCOHOLIC   Chronic hepatitis C ([place])   Hepatocellular carcinoma (HCC)   Hyponatremia   Pressure injury of skin   Fall   Ulcer of toe ([place])   DNR (do not resuscitate)   Palliative care encounter   Protein-calorie malnutrition, severe   Hyponatremia In setting of poor oral intake. Complicated by liver disease, however, he has responded to IV fluids and holding of lasix. Sodium of 128 currently -Continue IV fluids -Daily BMP  Toxic encephalopathy In setting of baclofen per chart review, however, patient started back on baclofen, so unsure. To date, he has received only one dose of baclofen. Concern for hepatic encephalopathy on admission, however, ammonia within normal limits. 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  Vascular surgery was consulted and recommended possible intervention pending patient's goals of care discussions. -Doxycycline/augmentin  Severe malnutrition In setting of chronic illness. Albumin of 1.6 in setting of liver disease. -Registered dietician recommendations  Neck rash Unknown etiology. -Continue steroid cream   DVT prophylaxis: Lovenox Code Status:   Code Status: DNR Family Communication: None at bedside Disposition Plan: Discharge to SNF with  palliative care when medically stable.   Consultants:   Vascular surgery  Palliative care medicine  Medical oncology/hematology  Procedures:   None  Antimicrobials:  Doxycycline    Subjective: Back pain.  Objective: Vitals:   02/26/18 1000 02/26/18 1500 02/26/18 2050 02/27/18 0611  BP: 98/79 109/76 103/82 106/71  Pulse: 89 87 93 81  Resp:  (!) 8 18 16   Temp: (!) 97.1 F (36.2 C) 97.6 F (36.4 C) (!) 97.5 F (36.4 C) 97.7 F (36.5 C)  TempSrc: Oral Oral Oral Oral  SpO2: 95% 97% 93% 95%  Weight:      Height:        Intake/Output Summary (Last 24 hours) at 02/27/2018 1518 Last data filed at 02/27/2018 1030 Gross per 24 hour  Intake 2039.59 ml  Output 200 ml  Net 1839.59 ml   Filed Weights   02/21/18 1254  Weight: 55 kg    Examination:  General exam: Appears calm and comfortable Respiratory system: Clear to auscultation. Respiratory effort normal. Cardiovascular system: S1 & S2 heard, RRR. No murmurs, rubs, gallops or clicks. Gastrointestinal system: Abdomen is distended, soft and nontender. Normal bowel sounds heard. Central nervous system: Alert and oriented. No focal neurological deficits. Musculoskeletal: No calf tenderness, generalized back tenderness without point tenderness Skin: No cyanosis. No rashes Psychiatry: Judgement and insight appear normal. Flat affect    Data Reviewed: I have personally reviewed following labs and imaging studies  CBC: Recent Labs  Lab 02/21/18 1236  02/22/18 0210 02/23/18 0251 02/24/18 0518 02/25/18 0419 02/26/18 1143  WBC 19.6*   < > 16.1* 14.9* 14.5* 16.1* 15.5*  NEUTROABS 16.1*  --   --  11.7* 11.5* 13.2*  --   HGB 15.6   < > 13.5 13.1 14.4 13.3 13.9  HCT 45.0   < > 39.5 38.1* 42.3 39.3 42.0  MCV 91.3   < > 92.5 92.5 93.4 94.5 96.1  PLT 250   < > 202 191 215 183 187   < > = values in this interval not displayed.   Basic Metabolic Panel: Recent Labs  Lab 02/21/18 1236  02/23/18 0251  02/24/18 0518 02/25/18 0419 02/26/18 1143 02/27/18 0402  NA 117*   < > 121* 120* 126* 128* 126*  K 5.4*   < > 4.4 4.5 4.8 5.0 4.9  CL 78*   < > 92* 90* 97* 100 101  CO2 24   < > 22 20* 21* 22 17*  GLUCOSE 100*   < > 107* 144* 113* 117* 113*  BUN 17   < > 13 16 23  30* 31*  CREATININE 0.50*   < > 0.40* 0.43* 0.59* 0.73 0.70  CALCIUM 8.4*   < > 7.8* 8.1* 7.7* 8.2* 7.7*  MG 2.0  --   --   --   --   --   --    < > = values in this interval not displayed.   GFR: Estimated Creatinine Clearance: 75.4 mL/min (by C-G formula based on SCr of 0.7 mg/dL). Liver Function Tests: Recent Labs  Lab 02/21/18 1236 02/22/18 0210 02/23/18 0251 02/24/18 0518 02/25/18 0419  AST 98* 78* 74* 82* 84*  ALT 48* 43 41 47* 49*  ALKPHOS 310* 270* 319* 392* 443*  BILITOT 1.8* 1.6* 1.4* 1.2 1.1  PROT 6.9 5.6* 5.3* 5.8* 5.4*  ALBUMIN 2.2* 1.8* 1.6* 1.8* 1.6*   Recent Labs  Lab 02/21/18 1236  LIPASE 30   Recent Labs  Lab 02/21/18 1657  AMMONIA 22   Coagulation Profile: Recent Labs  Lab 02/21/18 1500 02/23/18 0251  INR 1.21 1.25   Cardiac Enzymes: No results for input(s): CKTOTAL, CKMB, CKMBINDEX, TROPONINI in the last 168 hours. BNP (last 3 results) No results for input(s): PROBNP in the last 8760 hours. HbA1C: No results for input(s): HGBA1C in the last 72 hours. CBG: No results for input(s): GLUCAP in the last 168 hours. Lipid Profile: No results for input(s): CHOL, HDL, LDLCALC, TRIG, CHOLHDL, LDLDIRECT in the last 72 hours. Thyroid Function Tests: No results for input(s): TSH, T4TOTAL, FREET4, T3FREE, THYROIDAB in the last 72 hours. Anemia Panel: No results for input(s): VITAMINB12, FOLATE, FERRITIN, TIBC, IRON, RETICCTPCT in the last 72 hours. Sepsis Labs: No results for input(s): PROCALCITON, LATICACIDVEN in the last 168 hours.  Recent Results (from the past 240 hour(s))  MRSA PCR Screening     Status: None   Collection Time: 02/21/18  4:00 PM  Result Value Ref Range Status    MRSA by PCR NEGATIVE NEGATIVE Final    Comment:        The GeneXpert MRSA Assay (FDA approved for NASAL specimens only), is one component of a comprehensive MRSA colonization surveillance program. It is not intended to diagnose MRSA infection nor to guide or monitor treatment for MRSA infections. Performed at [facility] [place], [place] [address], [place],  [address]   Culture, blood (routine x 2)     Status: None   Collection Time: 02/21/18  5:02 PM  Result Value Ref Range Status   Specimen Description   Final    BLOOD RIGHT ANTECUBITAL Performed at [facility] [address] [place],  [place] [address]    Special Requests   Final    BOTTLES DRAWN AEROBIC AND ANAEROBIC Blood Culture adequate volume Performed at [facility] [address] [place], [place] [address]    Culture   Final    NO GROWTH 5 DAYS Performed at [facility], [place] [address], [place], [place] [address]    Report Status 02/26/2018 FINAL  Final  Culture, blood (routine x 2)     Status: None   Collection Time: 02/21/18  5:03 PM  Result Value Ref Range Status   Specimen Description   Final    BLOOD RIGHT HAND Performed at [facility] [address] [place], [place] [address]    Special Requests   Final    BOTTLES DRAWN AEROBIC ONLY Blood Culture adequate volume Performed at [facility] [address], [place], [place] [address]    Culture   Final    NO GROWTH 5 DAYS Performed at [facility], [place] [address], [place], [place] [address]    Report Status 02/26/2018 FINAL  Final         Radiology Studies: No results found.      Scheduled Meds: . amoxicillin-clavulanate  1 tablet Oral Q12H  . B-complex with vitamin C  1 tablet Oral Daily  . diclofenac sodium  4 g Topical QID  . doxycycline  100 mg Oral Q12H  . enoxaparin (LOVENOX) injection  40 mg Subcutaneous Q24H  . feeding  supplement (ENSURE ENLIVE)  237 mL Oral BID BM  . [START ON 02/28/2018] lactulose  10 g Oral Daily  . multivitamin with minerals  1 tablet Oral Daily  . traZODone  100 mg Oral QHS  . triamcinolone cream   Topical BID   Continuous Infusions: . sodium chloride 100 mL/hr at 02/27/18 0906     LOS: 6 days     Cordelia Poche, MD [facility] 02/27/2018, 3:18 PM  If 7PM-7AM, please contact night-coverage [email or web address]

## 2018-02-27 NOTE — Progress Notes (Signed)
Physical Therapy Treatment Patient Details Name: Joshua [name] MRN: [id number] DOB: [birth year] Today's Date: 02/27/2018    History of Present Illness 62 y.o. male with medical history significant of hepatocellular carcinoma, cirrhosis, alcoholism, hepatitis C, multiple other chronic medical problems presenting after a fall at home and diagnosed with toxic metabolic encephalopathy related either to liver cirrhosis versus metastatic liver disease from untreated hep C    PT Comments    Pt increasingly discouraged and frustrated with mobility today. Pt expressing to PT "I wish they would just sedate me" and cursed throughout session. Pt transferred to recliner at bedside with mod assist for steadying, and requires frequent verbal cues for safety. PT to continue to follow for mobility/positioning needs.    Follow Up Recommendations  SNF     Equipment Recommendations  None recommended by PT    Recommendations for Other Services       Precautions / Restrictions Precautions Precautions: Fall Precaution Comments: fragile skin, sores on UEs  Restrictions Weight Bearing Restrictions: No    Mobility  Bed Mobility   Bed Mobility: Rolling;Sidelying to Sit Rolling: Mod assist Sidelying to sit: Mod assist;HOB elevated     Sit to sidelying: Mod assist;HOB elevated General bed mobility comments: Pt with BM upon PT arrival to room. Rolling for pericare. Mod assist for bed mobility for LE management, trunk management. Verbal and tactile cuing provided throughout. Upon sitting EOB, pt expressing frustration with series of curse words.   Transfers Overall transfer level: Needs assistance Equipment used: Rolling walker (2 wheeled) Transfers: Sit to/from [facility] Sit to Stand: From elevated surface;Mod assist;+2 physical assistance Stand pivot transfers: Mod assist       General transfer comment: Assist for power up, increased time to perform knee and hip extension.  Verbal cuing for hand placement. Stand pivot to chair with mod assist for steadying and directing to chair. Upon sitting in chair, pt with cursing and stating he was uncomfortable, and stated "I wish they would just sedate me". Pt extremely uncomfortable in chair even after 5 minutes of attempting to alter pt's position for comfort. Pt transferred back to bed with mod assist.   Ambulation/Gait Ambulation/Gait assistance: (Pt would not tolerate )               Stairs             Wheelchair Mobility    Modified Rankin (Stroke Patients Only)       Balance Overall balance assessment: Needs assistance;History of Falls Sitting-balance support: Single extremity supported Sitting balance-Leahy Scale: Fair     Standing balance support: Bilateral upper extremity supported Standing balance-Leahy Scale: Poor Standing balance comment: relies on PT steadying and RW for support                             Cognition Arousal/Alertness: Awake/alert Behavior During Therapy: WFL for tasks assessed/performed Overall Cognitive Status: Within Functional Limits for tasks assessed                                        Exercises      General Comments        Pertinent Vitals/Pain Pain Assessment: Faces Faces Pain Scale: Hurts even more Pain Location: "all over", with mobility  Pain Descriptors / Indicators: Tender;Sore Pain Intervention(s): Limited activity within patient's tolerance;Repositioned;Monitored during session  Home Living                      Prior Function            PT Goals (current goals can now be found in the care plan section) Acute Rehab PT Goals PT Goal Formulation: With patient Time For Goal Achievement: 03/08/18 Potential to Achieve Goals: Fair Progress towards PT goals: Progressing toward goals    Frequency    Min 3X/week      PT Plan Current plan remains appropriate    Co-evaluation               AM-PAC PT "6 Clicks" Daily Activity  Outcome Measure  Difficulty turning over in bed (including adjusting bedclothes, sheets and blankets)?: Unable Difficulty moving from lying on back to sitting on the side of the bed? : Unable Difficulty sitting down on and standing up from a chair with arms (e.g., wheelchair, bedside commode, etc,.)?: Unable Help needed moving to and from a bed to chair (including a wheelchair)?: A Lot Help needed walking in hospital room?: A Lot Help needed climbing 3-5 steps with a railing? : Total 6 Click Score: 8    End of Session Equipment Utilized During Treatment: Gait belt Activity Tolerance: Patient limited by fatigue Patient left: in bed;with call bell/phone within reach;with bed alarm set Nurse Communication: Mobility status PT Visit Diagnosis: Difficulty in walking, not elsewhere classified (R26.2);Muscle weakness (generalized) (M62.81)     Time: 9311-2162 PT Time Calculation (min) (ACUTE ONLY): 29 min  Charges:  $Therapeutic Activity: 23-37 mins                     Julien Girt, PT Acute Rehabilitation Services Pager [phone]  Office [phone]  Roxine Caddy D Elonda Husky 02/27/2018, 3:24 PM

## 2018-02-28 ENCOUNTER — Inpatient Hospital Stay (HOSPITAL_COMMUNITY): Payer: BLUE CROSS/BLUE SHIELD

## 2018-02-28 LAB — CBC
HEMATOCRIT: 39.3 % (ref 39.0–52.0)
HEMOGLOBIN: 12.8 g/dL — AB (ref 13.0–17.0)
MCH: 31.4 pg (ref 26.0–34.0)
MCHC: 32.6 g/dL (ref 30.0–36.0)
MCV: 96.6 fL (ref 80.0–100.0)
Platelets: 224 10*3/uL (ref 150–400)
RBC: 4.07 MIL/uL — ABNORMAL LOW (ref 4.22–5.81)
RDW: 19.5 % — ABNORMAL HIGH (ref 11.5–15.5)
WBC: 17.3 10*3/uL — ABNORMAL HIGH (ref 4.0–10.5)
nRBC: 0 % (ref 0.0–0.2)

## 2018-02-28 LAB — BASIC METABOLIC PANEL
Anion gap: 8 (ref 5–15)
Anion gap: 5 (ref 5–15)
BUN: 38 mg/dL — AB (ref 8–23)
BUN: 42 mg/dL — AB (ref 8–23)
CALCIUM: 7.7 mg/dL — AB (ref 8.9–10.3)
CHLORIDE: 104 mmol/L (ref 98–111)
CO2: 17 mmol/L — ABNORMAL LOW (ref 22–32)
CO2: 19 mmol/L — AB (ref 22–32)
CREATININE: 0.98 mg/dL (ref 0.61–1.24)
Calcium: 7.8 mg/dL — ABNORMAL LOW (ref 8.9–10.3)
Chloride: 101 mmol/L (ref 98–111)
Creatinine, Ser: 0.81 mg/dL (ref 0.61–1.24)
GFR calc Af Amer: >60 mL/min (ref >60)
GFR calc Af Amer: >60 mL/min (ref >60)
GLUCOSE: 123 mg/dL — AB (ref 70–99)
Glucose, Bld: 124 mg/dL — ABNORMAL HIGH (ref 70–99)
POTASSIUM: 5 mmol/L (ref 3.5–5.1)
Potassium: 4.7 mmol/L (ref 3.5–5.1)
Sodium: 126 mmol/L — ABNORMAL LOW (ref 135–145)
Sodium: 128 mmol/L — ABNORMAL LOW (ref 135–145)

## 2018-02-28 MED ORDER — LIDOCAINE HCL 1 % IJ SOLN
INTRAMUSCULAR | Status: AC
  Filled 2018-02-28: qty 10

## 2018-02-28 MED ORDER — MORPHINE SULFATE (PF) 4 MG/ML IV SOLN
4.0000 mg | Freq: Once | INTRAVENOUS | Status: AC
  Administered 2018-02-28: 4 mg via INTRAVENOUS
  Filled 2018-02-28: qty 1

## 2018-02-28 MED ORDER — ALBUMIN HUMAN 25 % IV SOLN
25.0000 g | Freq: Once | INTRAVENOUS | Status: AC
  Administered 2018-02-28: 25 g via INTRAVENOUS
  Filled 2018-02-28: qty 100

## 2018-02-28 NOTE — Progress Notes (Signed)
PROGRESS NOTE    [name]  GHW:[id number] DOB: 01/20/1956 DOA: 02/21/2018 PCP: [name], MD   Brief Narrative: Joshua [name] is a 62 y.o. male with a history of liver cirrhosis secondary to hepatitis C and alcoholism, hepatocellular carcinoma stage II, alcohol abuse, hypertension.  Patient presented secondary to confusion and being found down.  He is found to have a sodium of 117 in setting of continued diuretic use.  Mental status has improved and sodium is also improved with IV fluids.   Assessment & Plan:   Active Problems:   Essential hypertension   CIRRHOSIS, ALCOHOLIC   Chronic hepatitis C ([place])   Hepatocellular carcinoma (HCC)   Hyponatremia   Pressure injury of skin   Fall   Ulcer of toe ([place])   DNR (do not resuscitate)   Palliative care encounter   Protein-calorie malnutrition, severe   Hyponatremia In setting of poor oral intake. Complicated by liver disease, however, he has responded to IV fluids and holding of lasix. Stabilized at 126. Ccontributed to by poor oral intake. -Daily BMP  Toxic encephalopathy In setting of baclofen per chart review, however, patient started back on baclofen, so unsure. To date, he has received only one dose of baclofen. Concern for hepatic encephalopathy on admission, however, ammonia within normal limits. 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  Vascular surgery was consulted and recommended possible intervention pending patient's goals of care discussions. -Doxycycline/augmentin  Severe malnutrition In setting of chronic illness. Albumin of 1.6 in setting of liver disease. -Registered dietician recommendations  Neck rash Unknown etiology. -Continue steroid cream   DVT prophylaxis: Lovenox Code Status:   Code Status: DNR Family Communication: None at bedside. Mother and father on  telephone Disposition Plan: Discharge to SNF with palliative care when medically stable.   Consultants:   Vascular surgery  Palliative care medicine  Medical oncology/hematology  Procedures:   None  Antimicrobials:  Doxycycline   Subjective: No issues today  Objective: Vitals:   02/28/18 1130 02/28/18 1135 02/28/18 1140 02/28/18 1145  BP: (!) 83/59 (!) 79/62 (!) 82/63 91/73  Pulse:      Resp:      Temp:      TempSrc:      SpO2:      Weight:      Height:        Intake/Output Summary (Last 24 hours) at 02/28/2018 1354 Last data filed at 02/28/2018 7893 Gross per 24 hour  Intake 2020.83 ml  Output 125 ml  Net 1895.83 ml   Filed Weights   02/21/18 1254  Weight: 55 kg    Examination:  General exam: Appears calm and comfortable Respiratory system: Clear to auscultation. Respiratory effort normal. Cardiovascular system: S1 & S2 heard, RRR. No murmurs, rubs, gallops or clicks. Gastrointestinal system: Abdomen is distended, soft and nontender. Normal bowel sounds heard. Central nervous system: Alert and oriented. No focal neurological deficits. Extremities: 1+ bilateral LE pitting edema. No calf tenderness Skin: No cyanosis. No rashes Psychiatry: Judgement and insight appear normal. Flat affect.    Data Reviewed: I have personally reviewed following labs and imaging studies  CBC: Recent Labs  Lab 02/23/18 0251 02/24/18 0518 02/25/18 0419 02/26/18 1143 02/28/18 0501  WBC 14.9* 14.5* 16.1* 15.5* 17.3*  NEUTROABS 11.7* 11.5* 13.2*  --   --   HGB 13.1 14.4 13.3 13.9 12.8*  HCT 38.1*  42.3 39.3 42.0 39.3  MCV 92.5 93.4 94.5 96.1 96.6  PLT 191 215 183 187 408   Basic Metabolic Panel: Recent Labs  Lab 02/24/18 0518 02/25/18 0419 02/26/18 1143 02/27/18 0402 02/28/18 0501  NA 120* 126* 128* 126* 126*  K 4.5 4.8 5.0 4.9 5.0  CL 90* 97* 100 101 101  CO2 20* 21* 22 17* 17*  GLUCOSE 144* 113* 117* 113* 123*  BUN 16 23 30* 31* 38*  CREATININE 0.43*  0.59* 0.73 0.70 0.81  CALCIUM 8.1* 7.7* 8.2* 7.7* 7.8*   GFR: Estimated Creatinine Clearance: 74.5 mL/min (by C-G formula based on SCr of 0.81 mg/dL). Liver Function Tests: Recent Labs  Lab 02/22/18 0210 02/23/18 0251 02/24/18 0518 02/25/18 0419  AST 78* 74* 82* 84*  ALT 43 41 47* 49*  ALKPHOS 270* 319* 392* 443*  BILITOT 1.6* 1.4* 1.2 1.1  PROT 5.6* 5.3* 5.8* 5.4*  ALBUMIN 1.8* 1.6* 1.8* 1.6*   No results for input(s): LIPASE, AMYLASE in the last 168 hours. Recent Labs  Lab 02/21/18 1657  AMMONIA 22   Coagulation Profile: Recent Labs  Lab 02/21/18 1500 02/23/18 0251  INR 1.21 1.25   Cardiac Enzymes: No results for input(s): CKTOTAL, CKMB, CKMBINDEX, TROPONINI in the last 168 hours. BNP (last 3 results) No results for input(s): PROBNP in the last 8760 hours. HbA1C: No results for input(s): HGBA1C in the last 72 hours. CBG: No results for input(s): GLUCAP in the last 168 hours. Lipid Profile: No results for input(s): CHOL, HDL, LDLCALC, TRIG, CHOLHDL, LDLDIRECT in the last 72 hours. Thyroid Function Tests: No results for input(s): TSH, T4TOTAL, FREET4, T3FREE, THYROIDAB in the last 72 hours. Anemia Panel: No results for input(s): VITAMINB12, FOLATE, FERRITIN, TIBC, IRON, RETICCTPCT in the last 72 hours. Sepsis Labs: No results for input(s): PROCALCITON, LATICACIDVEN in the last 168 hours.  Recent Results (from the past 240 hour(s))  MRSA PCR Screening     Status: None   Collection Time: 02/21/18  4:00 PM  Result Value Ref Range Status   MRSA by PCR NEGATIVE NEGATIVE Final    Comment:        The GeneXpert MRSA Assay (FDA approved for NASAL specimens only), is one component of a comprehensive MRSA colonization surveillance program. It is not intended to diagnose MRSA infection nor to guide or monitor treatment for MRSA infections. Performed at [facility], [place] [address], [place], [place] [address]   Culture, blood (routine x 2)      Status: None   Collection Time: 02/21/18  5:02 PM  Result Value Ref Range Status   Specimen Description   Final    BLOOD RIGHT ANTECUBITAL Performed at [facility] [address], [place], [place] [address]    Special Requests   Final    BOTTLES DRAWN AEROBIC AND ANAEROBIC Blood Culture adequate volume Performed at [facility] [address] [place], [place] [address]    Culture   Final    NO GROWTH 5 DAYS Performed at [facility], [place] [address], [place], [place] [address]    Report Status 02/26/2018 FINAL  Final  Culture, blood (routine x 2)     Status: None   Collection Time: 02/21/18  5:03 PM  Result Value Ref Range Status   Specimen Description   Final    BLOOD RIGHT HAND Performed at [facility] [address], [place],  [address]    Special Requests  Final    BOTTLES DRAWN AEROBIC ONLY Blood Culture adequate volume Performed at [facility] [address] [place], [place] [address]    Culture   Final    NO GROWTH 5 DAYS Performed at [facility], [place] [address], [place], [place] [address]    Report Status 02/26/2018 FINAL  Final         Radiology Studies: US Paracentesis  Result Date: 02/28/2018 INDICATION: Patient with history of hepatitis-C, hepatocellular carcinoma, cirrhosis, and recurrent ascites. Request for therapeutic paracentesis. EXAM: ULTRASOUND GUIDED LEFT LOWER QUADRANT PARACENTESIS MEDICATIONS: None. COMPLICATIONS: None immediate. PROCEDURE: Informed written consent was obtained from the patient after a discussion of the risks, benefits and alternatives to treatment. A timeout was performed prior to the initiation of the procedure. Initial ultrasound scanning demonstrates a large amount of ascites within the left lower abdominal quadrant. The left lower abdomen was prepped and draped in the usual sterile fashion. 1% lidocaine with epinephrine was  used for local anesthesia. Following this, a 19 gauge, 7-cm, Yueh catheter was introduced. An ultrasound image was saved for documentation purposes. The paracentesis was performed. The catheter was removed and a dressing was applied. The patient tolerated the procedure well without immediate post procedural complication. FINDINGS: A total of approximately 7.85 L of clear yellow fluid was removed. IMPRESSION: Successful ultrasound-guided paracentesis yielding 7.85 liters of peritoneal fluid. Read by: Ascencion Dike PA-C Electronically Signed   By: Sandi Mariscal M.D.   On: 02/28/2018 11:43        Scheduled Meds: . amoxicillin-clavulanate  1 tablet Oral Q12H  . B-complex with vitamin C  1 tablet Oral Daily  . diclofenac sodium  4 g Topical QID  . doxycycline  100 mg Oral Q12H  . enoxaparin (LOVENOX) injection  40 mg Subcutaneous Q24H  . feeding supplement (ENSURE ENLIVE)  237 mL Oral BID BM  . lactulose  10 g Oral Daily  . lidocaine      . multivitamin with minerals  1 tablet Oral Daily  . traZODone  100 mg Oral QHS  . triamcinolone cream   Topical BID   Continuous Infusions: . albumin human       LOS: 7 days     Cordelia Poche, MD [facility] 02/28/2018, 1:54 PM  If 7PM-7AM, please contact night-coverage [email or web address]

## 2018-02-28 NOTE — Progress Notes (Addendum)
Verbal consent for [place] with paracentesis completed via telephone with patient's mother Marcia Brash. Second verifier RN Fred signed consent.

## 2018-02-28 NOTE — Procedures (Signed)
PROCEDURE SUMMARY:  Successful US guided paracentesis from [facility].  Yielded 7.85 L of clear yellow fluid.  No immediate complications.  Pt tolerated well.   Specimen was not sent for labs.  Ascencion Dike PA-C 02/28/2018 11:37 AM

## 2018-02-28 NOTE — Progress Notes (Signed)
Met with pt this morning re: DC plan- pt maintains he has no preference and defers again to mother/stepfather. CSW left voicemail requesting call back in order to progress with choosing SNF from bed offers given yesterday.   Sharren Bridge, MSW, LCSW Clinical Social Work 02/28/2018 [phone]

## 2018-03-01 LAB — CBC WITH DIFFERENTIAL/PLATELET
Abs Immature Granulocytes: 0.23 10*3/uL — ABNORMAL HIGH (ref 0.00–0.07)
BASOS PCT: 0 %
Basophils Absolute: 0 10*3/uL (ref 0.0–0.1)
EOS ABS: 0.1 10*3/uL (ref 0.0–0.5)
Eosinophils Relative: 0 %
HCT: 34.7 % — ABNORMAL LOW (ref 39.0–52.0)
Hemoglobin: 11.4 g/dL — ABNORMAL LOW (ref 13.0–17.0)
Immature Granulocytes: 1 %
LYMPHS ABS: 0.4 10*3/uL — AB (ref 0.7–4.0)
LYMPHS PCT: 3 %
MCH: 31.9 pg (ref 26.0–34.0)
MCHC: 32.9 g/dL (ref 30.0–36.0)
MCV: 97.2 fL (ref 80.0–100.0)
MONO ABS: 1.2 10*3/uL — AB (ref 0.1–1.0)
MONOS PCT: 7 %
NEUTROS ABS: 14.2 10*3/uL — AB (ref 1.7–7.7)
NEUTROS PCT: 89 %
Platelets: 191 10*3/uL (ref 150–400)
RBC: 3.57 MIL/uL — AB (ref 4.22–5.81)
RDW: 19.5 % — AB (ref 11.5–15.5)
WBC: 16.1 10*3/uL — ABNORMAL HIGH (ref 4.0–10.5)
nRBC: 0 % (ref 0.0–0.2)

## 2018-03-01 LAB — BASIC METABOLIC PANEL
Anion gap: 7 (ref 5–15)
BUN: 44 mg/dL — AB (ref 8–23)
CALCIUM: 7.8 mg/dL — AB (ref 8.9–10.3)
CO2: 17 mmol/L — ABNORMAL LOW (ref 22–32)
CREATININE: 0.84 mg/dL (ref 0.61–1.24)
Chloride: 103 mmol/L (ref 98–111)
GFR calc Af Amer: >60 mL/min (ref >60)
GFR calc non Af Amer: >60 mL/min (ref >60)
GLUCOSE: 115 mg/dL — AB (ref 70–99)
Potassium: 4.5 mmol/L (ref 3.5–5.1)
SODIUM: 127 mmol/L — AB (ref 135–145)

## 2018-03-01 MED ORDER — LORAZEPAM 0.5 MG PO TABS
0.5000 mg | ORAL_TABLET | Freq: Once | ORAL | Status: AC
  Administered 2018-03-01: 0.5 mg via ORAL
  Filled 2018-03-01: qty 1

## 2018-03-01 NOTE — Progress Notes (Signed)
PROGRESS NOTE    Joshua [name]  XNA:[id number] DOB: Mar 27, 1956 DOA: 02/21/2018 PCP: [name], MD   Brief Narrative: KEEN EWALT is a 62 y.o. male with a history of liver cirrhosis secondary to hepatitis C and alcoholism, hepatocellular carcinoma stage II, alcohol abuse, hypertension.  Patient presented secondary to confusion and being found down.  He is found to have a sodium of 117 in setting of continued diuretic use.  Mental status has improved and sodium is also improved with IV fluids.   Assessment & Plan:   Active Problems:   Essential hypertension   CIRRHOSIS, ALCOHOLIC   Chronic hepatitis C ([place])   Hepatocellular carcinoma (HCC)   Hyponatremia   Pressure injury of skin   Fall   Ulcer of toe ([place])   DNR (do not resuscitate)   Palliative care encounter   Protein-calorie malnutrition, severe   Hyponatremia In setting of poor oral intake. Complicated by liver disease, however, he has responded to IV fluids and holding of lasix. Contributed to by poor oral intake. Stable. -Daily BMP  Toxic encephalopathy In setting of baclofen per chart review, however, patient started back on baclofen, so unsure. To date, he has received only one dose of baclofen. Concern for hepatic encephalopathy on admission, however, ammonia within normal limits. Resolved. 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  Vascular surgery was consulted and recommended possible intervention pending patient's goals of care discussions. -Doxycycline/augmentin  Severe malnutrition In setting of chronic illness. Albumin of 1.6 in setting of liver disease. -Registered dietician recommendations  Neck rash Unknown etiology. -Continue steroid cream   DVT prophylaxis: Lovenox Code Status:   Code Status: DNR Family Communication: None at bedside. Disposition Plan: Discharge  to SNF with palliative care when bed available   Consultants:   Vascular surgery  Palliative care medicine  Medical oncology/hematology  Procedures:   None  Antimicrobials:  Doxycycline/Augmentin   Subjective: No issues today. Difficulty eating breakfast because of positioning.  Objective: Vitals:   02/28/18 1615 02/28/18 2035 03/01/18 0400 03/01/18 0908  BP: 92/61 110/62 100/64   Pulse: 88 72 82   Resp: 18 16 14    Temp: 98.1 F (36.7 C) 98.2 F (36.8 C) 98.2 F (36.8 C)   TempSrc: Oral Oral Oral   SpO2: 100% 99% 100% 92%  Weight:   62.4 kg   Height:        Intake/Output Summary (Last 24 hours) at 03/01/2018 1025 Last data filed at 03/01/2018 0400 Gross per 24 hour  Intake 100 ml  Output 800 ml  Net -700 ml   Filed Weights   02/21/18 1254 03/01/18 0400  Weight: 55 kg 62.4 kg    Examination:  General exam: Appears calm and comfortable Respiratory system: Clear to auscultation. Respiratory effort normal. Cardiovascular system: S1 & S2 heard, RRR. No murmurs, rubs, gallops or clicks. Gastrointestinal system: Abdomen is distended, soft and nontender. No organomegaly or masses felt. Normal bowel sounds heard. Central nervous system: Alert and oriented. No focal neurological deficits. Extremities: 1+ LE pitting edema. No calf tenderness Skin: No cyanosis. No rashes Psychiatry: Judgement and insight appear normal. Flat affect, depressed mood.    Data Reviewed: I have personally reviewed following labs and imaging studies  CBC: Recent Labs  Lab 02/23/18 0251 02/24/18 0518 02/25/18 0419 02/26/18 1143 02/28/18 0501 03/01/18 0416  WBC 14.9* 14.5* 16.1* 15.5* 17.3* 16.1*  NEUTROABS 11.7* 11.5* 13.2*  --   --  14.2*  HGB 13.1 14.4 13.3 13.9 12.8* 11.4*  HCT 38.1* 42.3 39.3 42.0 39.3 34.7*  MCV 92.5 93.4 94.5 96.1 96.6 97.2  PLT 191 215 183 187 224 161   Basic Metabolic Panel: Recent Labs  Lab 02/26/18 1143 02/27/18 0402 02/28/18 0501  02/28/18 1416 03/01/18 0416  NA 128* 126* 126* 128* 127*  K 5.0 4.9 5.0 4.7 4.5  CL 100 101 101 104 103  CO2 22 17* 17* 19* 17*  GLUCOSE 117* 113* 123* 124* 115*  BUN 30* 31* 38* 42* 44*  CREATININE 0.73 0.70 0.81 0.98 0.84  CALCIUM 8.2* 7.7* 7.8* 7.7* 7.8*   GFR: Estimated Creatinine Clearance: 81.5 mL/min (by C-G formula based on SCr of 0.84 mg/dL). Liver Function Tests: Recent Labs  Lab 02/23/18 0251 02/24/18 0518 02/25/18 0419  AST 74* 82* 84*  ALT 41 47* 49*  ALKPHOS 319* 392* 443*  BILITOT 1.4* 1.2 1.1  PROT 5.3* 5.8* 5.4*  ALBUMIN 1.6* 1.8* 1.6*   No results for input(s): LIPASE, AMYLASE in the last 168 hours. No results for input(s): AMMONIA in the last 168 hours. Coagulation Profile: Recent Labs  Lab 02/23/18 0251  INR 1.25   Cardiac Enzymes: No results for input(s): CKTOTAL, CKMB, CKMBINDEX, TROPONINI in the last 168 hours. BNP (last 3 results) No results for input(s): PROBNP in the last 8760 hours. HbA1C: No results for input(s): HGBA1C in the last 72 hours. CBG: No results for input(s): GLUCAP in the last 168 hours. Lipid Profile: No results for input(s): CHOL, HDL, LDLCALC, TRIG, CHOLHDL, LDLDIRECT in the last 72 hours. Thyroid Function Tests: No results for input(s): TSH, T4TOTAL, FREET4, T3FREE, THYROIDAB in the last 72 hours. Anemia Panel: No results for input(s): VITAMINB12, FOLATE, FERRITIN, TIBC, IRON, RETICCTPCT in the last 72 hours. Sepsis Labs: No results for input(s): PROCALCITON, LATICACIDVEN in the last 168 hours.  Recent Results (from the past 240 hour(s))  MRSA PCR Screening     Status: None   Collection Time: 02/21/18  4:00 PM  Result Value Ref Range Status   MRSA by PCR NEGATIVE NEGATIVE Final    Comment:        The GeneXpert MRSA Assay (FDA approved for NASAL specimens only), is one component of a comprehensive MRSA colonization surveillance program. It is not intended to diagnose MRSA infection nor to guide or monitor  treatment for MRSA infections. Performed at [facility], [place] [address], [place], [place] [address]   Culture, blood (routine x 2)     Status: None   Collection Time: 02/21/18  5:02 PM  Result Value Ref Range Status   Specimen Description   Final    BLOOD RIGHT ANTECUBITAL Performed at [facility] [address], [place], [place] [address]    Special Requests   Final    BOTTLES DRAWN AEROBIC AND ANAEROBIC Blood Culture adequate volume Performed at [facility] [address] [place], [place] [address]    Culture   Final    NO GROWTH 5 DAYS Performed at [facility], [place] [address], [place], [place] [address]    Report Status 02/26/2018 FINAL  Final  Culture, blood (routine x 2)     Status: None   Collection Time: 02/21/18  5:03 PM  Result Value Ref Range Status   Specimen Description   Final    BLOOD RIGHT HAND Performed at [facility], [address] [place], [place] [address]    Special Requests   Final    BOTTLES DRAWN AEROBIC ONLY Blood Culture adequate volume Performed at [facility] [address], [place], [place] [address]    Culture   Final    NO GROWTH 5 DAYS Performed at [facility], [place] [address] [place], [place] [address]    Report Status 02/26/2018 FINAL  Final         Radiology Studies: US Paracentesis  Result Date: 02/28/2018 INDICATION: Patient with history of hepatitis-C, hepatocellular carcinoma, cirrhosis, and recurrent ascites. Request for therapeutic paracentesis. EXAM: ULTRASOUND GUIDED LEFT LOWER QUADRANT PARACENTESIS MEDICATIONS: None. COMPLICATIONS: None immediate. PROCEDURE: Informed written consent was obtained from the patient after a discussion of the risks, benefits and alternatives to treatment. A timeout was performed prior to the initiation of the procedure. Initial ultrasound scanning demonstrates a large amount of  ascites within the left lower abdominal quadrant. The left lower abdomen was prepped and draped in the usual sterile fashion. 1% lidocaine with epinephrine was used for local anesthesia. Following this, a 19 gauge, 7-cm, Yueh catheter was introduced. An ultrasound image was saved for documentation purposes. The paracentesis was performed. The catheter was removed and a dressing was applied. The patient tolerated the procedure well without immediate post procedural complication. FINDINGS: A total of approximately 7.85 L of clear yellow fluid was removed. IMPRESSION: Successful ultrasound-guided paracentesis yielding 7.85 liters of peritoneal fluid. Read by: Ascencion Dike PA-C Electronically Signed   By: Sandi Mariscal M.D.   On: 02/28/2018 11:43        Scheduled Meds: . amoxicillin-clavulanate  1 tablet Oral Q12H  . B-complex with vitamin C  1 tablet Oral Daily  . diclofenac sodium  4 g Topical QID  . doxycycline  100 mg Oral Q12H  . enoxaparin (LOVENOX) injection  40 mg Subcutaneous Q24H  . feeding supplement (ENSURE ENLIVE)  237 mL Oral BID BM  . lactulose  10 g Oral Daily  . multivitamin with minerals  1 tablet Oral Daily  . traZODone  100 mg Oral QHS  . triamcinolone cream   Topical BID   Continuous Infusions:    LOS: 8 days     Cordelia Poche, MD [facility] 03/01/2018, 10:25 AM  If 7PM-7AM, please contact night-coverage [email or web address]

## 2018-03-02 NOTE — Progress Notes (Signed)
PROGRESS NOTE    Joshua [name]  RUE:[id number] DOB: 1956-05-08 DOA: 02/21/2018 PCP: [name], MD   Brief Narrative: Joshua [name] is a 62 y.o. male with a history of liver cirrhosis secondary to hepatitis C and alcoholism, hepatocellular carcinoma stage II, alcohol abuse, hypertension.  Patient presented secondary to confusion and being found down.  He is found to have a sodium of 117 in setting of continued diuretic use.  Mental status has improved and sodium is also improved with IV fluids.   Assessment & Plan:   Active Problems:   Essential hypertension   CIRRHOSIS, ALCOHOLIC   Chronic hepatitis C ([place])   Hepatocellular carcinoma (HCC)   Hyponatremia   Pressure injury of skin   Fall   Ulcer of toe ([place])   DNR (do not resuscitate)   Palliative care encounter   Protein-calorie malnutrition, severe   Hyponatremia In setting of poor oral intake. Complicated by liver disease, however, he has responded to IV fluids and holding of lasix. Contributed to by poor oral intake. Stable. -Daily BMP  Toxic encephalopathy In setting of baclofen per chart review, however, patient started back on baclofen, so unsure. To date, he has received only one dose of baclofen. Concern for hepatic encephalopathy on admission, however, ammonia within normal limits. Resolved. 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  Vascular surgery was consulted and recommended possible intervention pending patient's goals of care discussions. -Doxycycline/augmentin  Severe malnutrition In setting of chronic illness. Albumin of 1.6 in setting of liver disease. -Registered dietician recommendations  Neck rash Unknown etiology. -Continue steroid cream  Cirrhosis secondary to hepatitis C and alcohol use Hepatitis C Associated ascites -Paracentesis as needed   DVT prophylaxis:  Lovenox Code Status:   Code Status: DNR Family Communication: None at bedside. Disposition Plan: Discharge to SNF with palliative care when bed available   Consultants:   Vascular surgery  Palliative care medicine  Medical oncology/hematology  Procedures:   None  Antimicrobials:  Doxycycline/Augmentin   Subjective: Wants another paracentesis. Generalized pain.  Objective: Vitals:   03/01/18 0908 03/01/18 1451 03/01/18 2018 03/02/18 0400  BP:  (!) 83/68 92/61 100/60  Pulse:  74 72 80  Resp:  16 14 16   Temp:  98.4 F (36.9 C) 98.6 F (37 C) 98.4 F (36.9 C)  TempSrc:  Oral Oral Oral  SpO2: 92% 98% 97% 98%  Weight:    64.2 kg  Height:        Intake/Output Summary (Last 24 hours) at 03/02/2018 1056 Last data filed at 03/02/2018 0400 Gross per 24 hour  Intake -  Output 900 ml  Net -900 ml   Filed Weights   02/21/18 1254 03/01/18 0400 03/02/18 0400  Weight: 55 kg 62.4 kg 64.2 kg    Examination:  General exam: Appears calm and comfortable Respiratory system: Clear to auscultation. Respiratory effort normal. Cardiovascular system: S1 & S2 heard, RRR. No murmurs, rubs, gallops or clicks. Gastrointestinal system: Abdomen is nondistended, soft and nontender. No organomegaly or masses felt. Normal bowel sounds heard. Central nervous system: Alert and oriented. No focal neurological deficits. Extremities: 2+ edema. No calf tenderness Skin: No cyanosis. No rashes Psychiatry: Judgement and insight appear normal. Flat affect. Slightly agitated.    Data Reviewed: I have personally reviewed following labs and imaging studies  CBC: Recent Labs  Lab 02/24/18 0518 02/25/18 0419 02/26/18 1143 02/28/18  0501 03/01/18 0416  WBC 14.5* 16.1* 15.5* 17.3* 16.1*  NEUTROABS 11.5* 13.2*  --   --  14.2*  HGB 14.4 13.3 13.9 12.8* 11.4*  HCT 42.3 39.3 42.0 39.3 34.7*  MCV 93.4 94.5 96.1 96.6 97.2  PLT 215 183 187 224 272   Basic Metabolic Panel: Recent Labs  Lab  02/26/18 1143 02/27/18 0402 02/28/18 0501 02/28/18 1416 03/01/18 0416  NA 128* 126* 126* 128* 127*  K 5.0 4.9 5.0 4.7 4.5  CL 100 101 101 104 103  CO2 22 17* 17* 19* 17*  GLUCOSE 117* 113* 123* 124* 115*  BUN 30* 31* 38* 42* 44*  CREATININE 0.73 0.70 0.81 0.98 0.84  CALCIUM 8.2* 7.7* 7.8* 7.7* 7.8*   GFR: Estimated Creatinine Clearance: 83.9 mL/min (by C-G formula based on SCr of 0.84 mg/dL). Liver Function Tests: Recent Labs  Lab 02/24/18 0518 02/25/18 0419  AST 82* 84*  ALT 47* 49*  ALKPHOS 392* 443*  BILITOT 1.2 1.1  PROT 5.8* 5.4*  ALBUMIN 1.8* 1.6*   No results for input(s): LIPASE, AMYLASE in the last 168 hours. No results for input(s): AMMONIA in the last 168 hours. Coagulation Profile: No results for input(s): INR, PROTIME in the last 168 hours. Cardiac Enzymes: No results for input(s): CKTOTAL, CKMB, CKMBINDEX, TROPONINI in the last 168 hours. BNP (last 3 results) No results for input(s): PROBNP in the last 8760 hours. HbA1C: No results for input(s): HGBA1C in the last 72 hours. CBG: No results for input(s): GLUCAP in the last 168 hours. Lipid Profile: No results for input(s): CHOL, HDL, LDLCALC, TRIG, CHOLHDL, LDLDIRECT in the last 72 hours. Thyroid Function Tests: No results for input(s): TSH, T4TOTAL, FREET4, T3FREE, THYROIDAB in the last 72 hours. Anemia Panel: No results for input(s): VITAMINB12, FOLATE, FERRITIN, TIBC, IRON, RETICCTPCT in the last 72 hours. Sepsis Labs: No results for input(s): PROCALCITON, LATICACIDVEN in the last 168 hours.  Recent Results (from the past 240 hour(s))  MRSA PCR Screening     Status: None   Collection Time: 02/21/18  4:00 PM  Result Value Ref Range Status   MRSA by PCR NEGATIVE NEGATIVE Final    Comment:        The GeneXpert MRSA Assay (FDA approved for NASAL specimens only), is one component of a comprehensive MRSA colonization surveillance program. It is not intended to diagnose MRSA infection nor to  guide or monitor treatment for MRSA infections. Performed at [facility], [place] [address], [place], [place] [address]   Culture, blood (routine x 2)     Status: None   Collection Time: 02/21/18  5:02 PM  Result Value Ref Range Status   Specimen Description   Final    BLOOD RIGHT ANTECUBITAL Performed at [facility] [address], [place], [place] [address]    Special Requests   Final    BOTTLES DRAWN AEROBIC AND ANAEROBIC Blood Culture adequate volume Performed at [facility] [address], [place], [place] [address]    Culture   Final    NO GROWTH 5 DAYS Performed at [facility], [place] [address] [place], [place] [address]    Report Status 02/26/2018 FINAL  Final  Culture, blood (routine x 2)     Status: None   Collection Time: 02/21/18  5:03 PM  Result Value Ref Range Status   Specimen Description   Final    BLOOD RIGHT HAND Performed at [facility]  [address] [place], [place] [address]    Special Requests   Final    BOTTLES DRAWN AEROBIC ONLY Blood Culture adequate volume Performed at [facility] [address], [place], [place] [address]    Culture   Final    NO GROWTH 5 DAYS Performed at [facility], [place] [address] [place], [place] [address]    Report Status 02/26/2018 FINAL  Final         Radiology Studies: US Paracentesis  Result Date: 02/28/2018 INDICATION: Patient with history of hepatitis-C, hepatocellular carcinoma, cirrhosis, and recurrent ascites. Request for therapeutic paracentesis. EXAM: ULTRASOUND GUIDED LEFT LOWER QUADRANT PARACENTESIS MEDICATIONS: None. COMPLICATIONS: None immediate. PROCEDURE: Informed written consent was obtained from the patient after a discussion of the risks, benefits and alternatives to treatment. A timeout was performed prior to the initiation of the procedure. Initial ultrasound scanning demonstrates a  large amount of ascites within the left lower abdominal quadrant. The left lower abdomen was prepped and draped in the usual sterile fashion. 1% lidocaine with epinephrine was used for local anesthesia. Following this, a 19 gauge, 7-cm, Yueh catheter was introduced. An ultrasound image was saved for documentation purposes. The paracentesis was performed. The catheter was removed and a dressing was applied. The patient tolerated the procedure well without immediate post procedural complication. FINDINGS: A total of approximately 7.85 L of clear yellow fluid was removed. IMPRESSION: Successful ultrasound-guided paracentesis yielding 7.85 liters of peritoneal fluid. Read by: Ascencion Dike PA-C Electronically Signed   By: Sandi Mariscal M.D.   On: 02/28/2018 11:43        Scheduled Meds: . amoxicillin-clavulanate  1 tablet Oral Q12H  . B-complex with vitamin C  1 tablet Oral Daily  . diclofenac sodium  4 g Topical QID  . doxycycline  100 mg Oral Q12H  . enoxaparin (LOVENOX) injection  40 mg Subcutaneous Q24H  . feeding supplement (ENSURE ENLIVE)  237 mL Oral BID BM  . lactulose  10 g Oral Daily  . multivitamin with minerals  1 tablet Oral Daily  . traZODone  100 mg Oral QHS  . triamcinolone cream   Topical BID   Continuous Infusions:    LOS: 9 days     Cordelia Poche, MD [facility] 03/02/2018, 10:56 AM  If 7PM-7AM, please contact night-coverage [email or web address]

## 2018-03-03 ENCOUNTER — Inpatient Hospital Stay (HOSPITAL_COMMUNITY): Payer: BLUE CROSS/BLUE SHIELD

## 2018-03-03 DIAGNOSIS — R1084 Generalized abdominal pain: Secondary | ICD-10-CM

## 2018-03-03 LAB — BODY FLUID CELL COUNT WITH DIFFERENTIAL
EOS FL: 0 %
Lymphs, Fluid: 21 %
Monocyte-Macrophage-Serous Fluid: 36 % — ABNORMAL LOW (ref 50–90)
Neutrophil Count, Fluid: 43 % — ABNORMAL HIGH (ref 0–25)
WBC FLUID: 205 uL (ref 0–1000)

## 2018-03-03 LAB — BASIC METABOLIC PANEL
ANION GAP: 6 (ref 5–15)
BUN: 49 mg/dL — ABNORMAL HIGH (ref 8–23)
CALCIUM: 8.4 mg/dL — AB (ref 8.9–10.3)
CHLORIDE: 101 mmol/L (ref 98–111)
CO2: 21 mmol/L — AB (ref 22–32)
Creatinine, Ser: 0.83 mg/dL (ref 0.61–1.24)
GFR calc non Af Amer: >60 mL/min (ref >60)
GLUCOSE: 108 mg/dL — AB (ref 70–99)
Potassium: 5.6 mmol/L — ABNORMAL HIGH (ref 3.5–5.1)
Sodium: 128 mmol/L — ABNORMAL LOW (ref 135–145)

## 2018-03-03 LAB — CBC
HEMATOCRIT: 38.8 % — AB (ref 39.0–52.0)
Hemoglobin: 13 g/dL (ref 13.0–17.0)
MCH: 31.7 pg (ref 26.0–34.0)
MCHC: 33.5 g/dL (ref 30.0–36.0)
MCV: 94.6 fL (ref 80.0–100.0)
Platelets: 228 10*3/uL (ref 150–400)
RBC: 4.1 MIL/uL — AB (ref 4.22–5.81)
RDW: 19.7 % — ABNORMAL HIGH (ref 11.5–15.5)
WBC: 14.2 10*3/uL — ABNORMAL HIGH (ref 4.0–10.5)
nRBC: 0 % (ref 0.0–0.2)

## 2018-03-03 LAB — GRAM STAIN

## 2018-03-03 MED ORDER — LIDOCAINE HCL 1 % IJ SOLN
INTRAMUSCULAR | Status: AC
  Filled 2018-03-03: qty 10

## 2018-03-03 NOTE — Procedures (Signed)
PROCEDURE SUMMARY:  Successful US guided paracentesis from right lateral abdomen.  Yielded 6.2 liters of clear, yellow fluid.  No immediate complications.  Pt tolerated well.   Specimen was sent for labs.  Docia Barrier PA-C 03/03/2018 4:23 PM

## 2018-03-03 NOTE — Progress Notes (Signed)
PROGRESS NOTE    Joshua [name]  GYI:[id number] DOB: 1955-10-11 DOA: 02/21/2018 PCP: [name], MD   Brief Narrative: Joshua [name] is a 62 y.o. male with a history of liver cirrhosis secondary to hepatitis C and alcoholism, hepatocellular carcinoma stage II, alcohol abuse, hypertension.  Patient presented secondary to confusion and being found down.  He is found to have a sodium of 117 in setting of continued diuretic use.  Mental status has improved and sodium is also improved with IV fluids.   Assessment & Plan:   Active Problems:   Essential hypertension   CIRRHOSIS, ALCOHOLIC   Chronic hepatitis C ([place])   Hepatocellular carcinoma (HCC)   Hyponatremia   Pressure injury of skin   Fall   Ulcer of toe ([place])   DNR (do not resuscitate)   Palliative care encounter   Protein-calorie malnutrition, severe   Hyponatremia In setting of poor oral intake. Complicated by liver disease, however, he has responded to IV fluids and holding of lasix. Contributed to by poor oral intake. Stable. -Daily BMP  Toxic/metabolic encephalopathy In setting of baclofen per chart review, however, patient started back on baclofen, so unsure. To date, he has received only one dose of baclofen. Concern for hepatic encephalopathy on admission, however, ammonia within normal limits. Resolved. -Continue lactulose (daily)  Abdominal pain Patient with associated ascites and confusion today. Concern for SBP. Associated leukocytosis. Afebrile. -Paracentesis with cell count, culture  Left fourth toe gangrene Patient with vascular disease leading to nonhealing ulcers on his leg in addition to left fourth toe gangrene.  Vascular surgery was consulted and recommended possible intervention pending patient's goals of care discussions. -Doxycycline/augmentin  Severe malnutrition In setting of chronic illness. Albumin of 1.6 in setting of liver disease. -Registered dietician recommendations  Neck  rash Unknown etiology. -Continue steroid cream  Cirrhosis secondary to hepatitis C and alcohol use Hepatitis C Associated ascites -Paracentesis as needed   DVT prophylaxis: Lovenox Code Status:   Code Status: DNR Family Communication: None at bedside. Disposition Plan: Discharge to SNF with palliative care when bed available and evaluation for SBP   Consultants:   Vascular surgery  Palliative care medicine  Medical oncology/hematology  Procedures:   None  Antimicrobials:  Doxycycline/Augmentin   Subjective: Patient has some abdominal pain. Agitated about being down in the bed.   Objective: Vitals:   03/01/18 2018 03/02/18 0400 03/02/18 1422 03/03/18 0622  BP: 92/61 100/60 (!) 88/62 (!) 84/62  Pulse: 72 80 79 81  Resp: 14 16 16 16   Temp: 98.6 F (37 C) 98.4 F (36.9 C) 97.7 F (36.5 C) (!) 97.5 F (36.4 C)  TempSrc: Oral Oral Oral Oral  SpO2: 97% 98% 97% 99%  Weight:  64.2 kg    Height:        Intake/Output Summary (Last 24 hours) at 03/03/2018 1019 Last data filed at 03/03/2018 0629 Gross per 24 hour  Intake 840 ml  Output 450 ml  Net 390 ml   Filed Weights   02/21/18 1254 03/01/18 0400 03/02/18 0400  Weight: 55 kg 62.4 kg 64.2 kg    Examination:  General exam: Appears calm and comfortable Respiratory system: Clear to auscultation. Respiratory effort normal. Cardiovascular system: S1 & S2 heard, RRR. No murmurs, rubs, gallops or clicks. Gastrointestinal system: Abdomen is distended, soft and mildly tender. Normal bowel sounds heard. Central nervous system: Alert and oriented to person and place. No focal neurological deficits. Extremities: No edema. No calf tenderness Skin: No cyanosis.  No rashes Psychiatry: Judgement and insight appear impaired. Flat affect and agitated    Data Reviewed: I have personally reviewed following labs and imaging studies  CBC: Recent Labs  Lab 02/25/18 0419 02/26/18 1143 02/28/18 0501 03/01/18 0416  03/03/18 0516  WBC 16.1* 15.5* 17.3* 16.1* 14.2*  NEUTROABS 13.2*  --   --  14.2*  --   HGB 13.3 13.9 12.8* 11.4* 13.0  HCT 39.3 42.0 39.3 34.7* 38.8*  MCV 94.5 96.1 96.6 97.2 94.6  PLT 183 187 224 191 481   Basic Metabolic Panel: Recent Labs  Lab 02/27/18 0402 02/28/18 0501 02/28/18 1416 03/01/18 0416 03/03/18 0516  NA 126* 126* 128* 127* 128*  K 4.9 5.0 4.7 4.5 5.6*  CL 101 101 104 103 101  CO2 17* 17* 19* 17* 21*  GLUCOSE 113* 123* 124* 115* 108*  BUN 31* 38* 42* 44* 49*  CREATININE 0.70 0.81 0.98 0.84 0.83  CALCIUM 7.7* 7.8* 7.7* 7.8* 8.4*   GFR: Estimated Creatinine Clearance: 84.9 mL/min (by C-G formula based on SCr of 0.83 mg/dL). Liver Function Tests: Recent Labs  Lab 02/25/18 0419  AST 84*  ALT 49*  ALKPHOS 443*  BILITOT 1.1  PROT 5.4*  ALBUMIN 1.6*   No results for input(s): LIPASE, AMYLASE in the last 168 hours. No results for input(s): AMMONIA in the last 168 hours. Coagulation Profile: No results for input(s): INR, PROTIME in the last 168 hours. Cardiac Enzymes: No results for input(s): CKTOTAL, CKMB, CKMBINDEX, TROPONINI in the last 168 hours. BNP (last 3 results) No results for input(s): PROBNP in the last 8760 hours. HbA1C: No results for input(s): HGBA1C in the last 72 hours. CBG: No results for input(s): GLUCAP in the last 168 hours. Lipid Profile: No results for input(s): CHOL, HDL, LDLCALC, TRIG, CHOLHDL, LDLDIRECT in the last 72 hours. Thyroid Function Tests: No results for input(s): TSH, T4TOTAL, FREET4, T3FREE, THYROIDAB in the last 72 hours. Anemia Panel: No results for input(s): VITAMINB12, FOLATE, FERRITIN, TIBC, IRON, RETICCTPCT in the last 72 hours. Sepsis Labs: No results for input(s): PROCALCITON, LATICACIDVEN in the last 168 hours.  Recent Results (from the past 240 hour(s))  MRSA PCR Screening     Status: None   Collection Time: 02/21/18  4:00 PM  Result Value Ref Range Status   MRSA by PCR NEGATIVE NEGATIVE Final     Comment:        The GeneXpert MRSA Assay (FDA approved for NASAL specimens only), is one component of a comprehensive MRSA colonization surveillance program. It is not intended to diagnose MRSA infection nor to guide or monitor treatment for MRSA infections. Performed at [facility], [place] [address], [place], [place] [address]   Culture, blood (routine x 2)     Status: None   Collection Time: 02/21/18  5:02 PM  Result Value Ref Range Status   Specimen Description   Final    BLOOD RIGHT ANTECUBITAL Performed at [facility] [address], [place], [place] [address]    Special Requests   Final    BOTTLES DRAWN AEROBIC AND ANAEROBIC Blood Culture adequate volume Performed at [facility] [address], [place], [place] [address]    Culture   Final    NO GROWTH 5 DAYS Performed at [facility], [place] [address], [place], [place] [address]    Report Status 02/26/2018 FINAL  Final  Culture, blood (routine x 2)     Status: None   Collection  Time: 02/21/18  5:03 PM  Result Value Ref Range Status   Specimen Description   Final    BLOOD RIGHT HAND Performed at [facility] [address] [place], [place] [address]    Special Requests   Final    BOTTLES DRAWN AEROBIC ONLY Blood Culture adequate volume Performed at [facility] [address] [place], [place] [address]    Culture   Final    NO GROWTH 5 DAYS Performed at [facility], [place] [address] [place], [place] [address]    Report Status 02/26/2018 FINAL  Final         Radiology Studies: No results found.      Scheduled Meds: . amoxicillin-clavulanate  1 tablet Oral Q12H  . B-complex with vitamin C  1 tablet Oral Daily  . diclofenac sodium  4 g Topical QID  . doxycycline  100 mg Oral Q12H  . enoxaparin (LOVENOX) injection  40 mg Subcutaneous Q24H  . feeding supplement (ENSURE ENLIVE)  237 mL Oral BID  BM  . lactulose  10 g Oral Daily  . multivitamin with minerals  1 tablet Oral Daily  . traZODone  100 mg Oral QHS  . triamcinolone cream   Topical BID   Continuous Infusions:    LOS: 10 days     Cordelia Poche, MD [facility] 03/03/2018, 10:19 AM  If 7PM-7AM, please contact night-coverage [email or web address]

## 2018-03-03 NOTE — Progress Notes (Signed)
Physical Therapy Treatment Patient Details Name: Joshua [name] MRN: [id number] DOB: [birth year] Today's Date: 03/03/2018    History of Present Illness 62 y.o. male with medical history significant of hepatocellular carcinoma, cirrhosis, alcoholism, hepatitis C, multiple other chronic medical problems presenting after a fall at home and diagnosed with toxic metabolic encephalopathy related either to liver cirrhosis versus metastatic liver disease from untreated hep C    PT Comments    Pt motivated to ambulate today, but presented with LE buckling and posterior postural lean in standing which limited pt's ability to mobilize. Pt requiring mod-max assist for all mobility at this time. Pt with less frustrated demeanor during session versus previous PT sessions, but pt presented with some confusion during session today regarding orientation to place. PT to continue to follow, and will continue to progress mobility as able.    Follow Up Recommendations  SNF     Equipment Recommendations  None recommended by PT    Recommendations for Other Services       Precautions / Restrictions Precautions Precautions: Fall Precaution Comments: fragile skin, sores on UEs  Restrictions Weight Bearing Restrictions: No    Mobility  Bed Mobility Overal bed mobility: Needs Assistance Bed Mobility: Supine to Sit;Sit to Supine     Supine to sit: Mod assist;HOB elevated Sit to supine: Max assist;+2 for physical assistance;HOB elevated   General bed mobility comments: Pt with mod assist supine >sit for LE management, trunk elevation, scooting to edge, and steadying. Pt provided verbal cues throughout.   Transfers Overall transfer level: Needs assistance Equipment used: Rolling walker (2 wheeled) Transfers: Sit to/from Stand Sit to Stand: From elevated surface;Mod assist Stand pivot transfers: Mod assist       General transfer comment: Assist for power up, hip extension, and steadying upon  standing. Pt stated "I want to walk". Pt took one small step forward with each LE, with posterior leaning and support from PT. Pt with LE buckling bilaterally due to LE weakness, pt sat back down on EOB and returned to supine position with max assist +2 for trunk/LE management, scooting up in bed, and positioning upon return to bed.   Ambulation/Gait Ambulation/Gait assistance: (Pt would not tolerate )               Stairs             Wheelchair Mobility    Modified Rankin (Stroke Patients Only)       Balance Overall balance assessment: Needs assistance;History of Falls Sitting-balance support: Single extremity supported Sitting balance-Leahy Scale: Fair     Standing balance support: Bilateral upper extremity supported Standing balance-Leahy Scale: Poor Standing balance comment: relies on PT steadying and RW for support                             Cognition Arousal/Alertness: Awake/alert Behavior During Therapy: WFL for tasks assessed/performed Overall Cognitive Status: Impaired/Different from baseline Area of Impairment: Orientation;Awareness                 Orientation Level: Place             General Comments: Pt stating that "they are working on the furnace below me, in the kitchen" and "I need to go to my fridge (pointing to the bathroom door)". Pt oriented to hospital setting, and pt stated in a frustrated manner "I know I am in the hospital!"       Exercises  General Comments        Pertinent Vitals/Pain Pain Assessment: Faces Faces Pain Scale: Hurts even more Pain Location: "all over", with mobility  Pain Descriptors / Indicators: Tender;Sore Pain Intervention(s): Limited activity within patient's tolerance;Repositioned;Monitored during session    Home Living                      Prior Function            PT Goals (current goals can now be found in the care plan section) Acute Rehab PT Goals PT Goal  Formulation: With patient Time For Goal Achievement: 03/08/18 Potential to Achieve Goals: Fair Progress towards PT goals: Progressing toward goals    Frequency    Min 2X/week      PT Plan Frequency needs to be updated    Co-evaluation              AM-PAC PT "6 Clicks" Daily Activity  Outcome Measure  Difficulty turning over in bed (including adjusting bedclothes, sheets and blankets)?: Unable Difficulty moving from lying on back to sitting on the side of the bed? : Unable Difficulty sitting down on and standing up from a chair with arms (e.g., wheelchair, bedside commode, etc,.)?: Unable Help needed moving to and from a bed to chair (including a wheelchair)?: A Lot Help needed walking in hospital room?: A Lot Help needed climbing 3-5 steps with a railing? : Total 6 Click Score: 8    End of Session Equipment Utilized During Treatment: Gait belt Activity Tolerance: Patient limited by fatigue Patient left: in bed;with call bell/phone within reach;with bed alarm set Nurse Communication: Mobility status PT Visit Diagnosis: Difficulty in walking, not elsewhere classified (R26.2);Muscle weakness (generalized) (M62.81)     Time: 3662-9476 PT Time Calculation (min) (ACUTE ONLY): 19 min  Charges:  $Therapeutic Activity: 8-22 mins                    Teila Skalsky Conception Chancy, PT Acute Rehabilitation Services Pager [phone]  Office [phone]    Finnlee Silvernail D Gagan Dillion 03/03/2018, 2:06 PM

## 2018-03-03 NOTE — Progress Notes (Signed)
Pt's mother selects [facility] from bed offers made. Notified facility, requested insurance authorization be initiated. Facility in need of therapy sessions within past 48 hours. CSW will send notes once pt participates with therapy again.   Sharren Bridge, MSW, LCSW Clinical Social Work 03/03/2018 [phone]

## 2018-03-04 MED ORDER — ALBUMIN HUMAN 25 % IV SOLN: 25.0000 g | Freq: Once | INTRAVENOUS | Status: DC

## 2018-03-04 MED ORDER — HYDROXYZINE HCL 10 MG PO TABS
10.0000 mg | ORAL_TABLET | Freq: Four times a day (QID) | ORAL | Status: DC | PRN
  Filled 2018-03-04: qty 1

## 2018-03-04 MED ORDER — ALBUMIN HUMAN 25 % IV SOLN
50.0000 g | Freq: Once | INTRAVENOUS | Status: AC
  Administered 2018-03-04: 50 g via INTRAVENOUS
  Filled 2018-03-04: qty 200

## 2018-03-04 NOTE — Progress Notes (Signed)
PROGRESS NOTE    Joshua [name]  ERD:[id number] DOB: [birth year] DOA: 02/21/2018 PCP: [name], MD   Brief Narrative: Joshua [name] is a 62 y.o. male with a history of liver cirrhosis secondary to hepatitis C and alcoholism, hepatocellular carcinoma stage II, alcohol abuse, hypertension.  Patient presented secondary to confusion and being found down.  He is found to have a sodium of 117 in setting of continued diuretic use.  Mental status has improved and sodium is also improved with IV fluids.   Assessment & Plan:   Active Problems:   Essential hypertension   CIRRHOSIS, ALCOHOLIC   Chronic hepatitis C ([place])   Hepatocellular carcinoma (H[facility])   Hyponatremia   Pressure injury of skin   Fall   Ulcer of toe ([place])   DNR (do not resuscitate)   Palliative care encounter   Protein-calorie malnutrition, severe   Hyponatremia In setting of poor oral intake. Complicated by liver disease, however, he has responded to IV fluids and holding of lasix. Contributed to by poor oral intake. Stable. -Daily BMP  Toxic/metabolic encephalopathy In setting of baclofen per chart review, however, patient started back on baclofen, so unsure. To date, he has received only one dose of baclofen. Concern for hepatic encephalopathy on admission, however, ammonia within normal limits. Mostly resolved. Patient sometimes waxes/wanes. -Hold lactulose for now secondary to significant diarrhea. May need to restart soon.  Abdominal pain Patient with associated ascites and confusion today. Concern for SBP. Associated leukocytosis. Afebrile. -Paracentesis with cell count, culture  Left fourth toe gangrene Patient with vascular disease leading to nonhealing ulcers on his leg in addition to left fourth toe gangrene.  Vascular surgery was consulted and recommended possible intervention pending patient's goals of care discussions. -Doxycycline/augmentin  Severe malnutrition In setting of chronic illness.  Albumin of 1.6 in setting of liver disease. -Registered dietician recommendations  Neck rash Unknown etiology. -Continue steroid cream  Cirrhosis secondary to hepatitis C and alcohol use Hepatitis C Associated ascites. Fluid overloaded however blood pressure will likely not tolerate diuretic management -Paracentesis as needed  Hypotension In setting of large volume paracentesis. -Albumin -Recheck blood pressure -Small fluid boluses as needed   DVT prophylaxis: Lovenox Code Status:   Code Status: DNR Family Communication: None at bedside. Disposition Plan: Discharge to SNF with palliative care when bed available   Consultants:   Vascular surgery  Palliative care medicine  Medical oncology/hematology  Procedures:   None  Antimicrobials:  Doxycycline/Augmentin   Subjective: No abdominal pain today. Feels better after paracentesis.  Objective: Vitals:   03/03/18 1621 03/03/18 1756 03/03/18 2036 03/04/18 0940  BP: (!) 86/68 98/70 (!) 71/55 (!) 86/58  Pulse:  86 81   Resp:  18    Temp:  (!) 97.5 F (36.4 C) 98.6 F (37 C)   TempSrc:  Oral Oral   SpO2:  98% 100%   Weight:      Height:        Intake/Output Summary (Last 24 hours) at 03/04/2018 1137 Last data filed at 03/04/2018 0900 Gross per 24 hour  Intake 0 ml  Output 300 ml  Net -300 ml   Filed Weights   02/21/18 1254 03/01/18 0400 03/02/18 0400  Weight: 55 kg 62.4 kg 64.2 kg    Examination:  General exam: Appears calm and comfortable  Respiratory system: Clear to auscultation. Respiratory effort normal. Cardiovascular system: S1 & S2 heard, RRR. No murmurs, rubs, gallops or clicks. Gastrointestinal system: Abdomen is mildly distended, soft and  nontender. Normal bowel sounds heard. Central nervous system: Alert and oriented. No focal neurological deficits. Extremities: 1+ LE pitting edema. No calf tenderness Skin: No cyanosis. No rashes    Data Reviewed: I have personally reviewed  following labs and imaging studies  CBC: Recent Labs  Lab 02/26/18 1143 02/28/18 0501 03/01/18 0416 03/03/18 0516  WBC 15.5* 17.3* 16.1* 14.2*  NEUTROABS  --   --  14.2*  --   HGB 13.9 12.8* 11.4* 13.0  HCT 42.0 39.3 34.7* 38.8*  MCV 96.1 96.6 97.2 94.6  PLT 187 224 191 546   Basic Metabolic Panel: Recent Labs  Lab 02/27/18 0402 02/28/18 0501 02/28/18 1416 03/01/18 0416 03/03/18 0516  NA 126* 126* 128* 127* 128*  K 4.9 5.0 4.7 4.5 5.6*  CL 101 101 104 103 101  CO2 17* 17* 19* 17* 21*  GLUCOSE 113* 123* 124* 115* 108*  BUN 31* 38* 42* 44* 49*  CREATININE 0.70 0.81 0.98 0.84 0.83  CALCIUM 7.7* 7.8* 7.7* 7.8* 8.4*   GFR: Estimated Creatinine Clearance: 84.9 mL/min (by C-G formula based on SCr of 0.83 mg/dL). Liver Function Tests: No results for input(s): AST, ALT, ALKPHOS, BILITOT, PROT, ALBUMIN in the last 168 hours. No results for input(s): LIPASE, AMYLASE in the last 168 hours. No results for input(s): AMMONIA in the last 168 hours. Coagulation Profile: No results for input(s): INR, PROTIME in the last 168 hours. Cardiac Enzymes: No results for input(s): CKTOTAL, CKMB, CKMBINDEX, TROPONINI in the last 168 hours. BNP (last 3 results) No results for input(s): PROBNP in the last 8760 hours. HbA1C: No results for input(s): HGBA1C in the last 72 hours. CBG: No results for input(s): GLUCAP in the last 168 hours. Lipid Profile: No results for input(s): CHOL, HDL, LDLCALC, TRIG, CHOLHDL, LDLDIRECT in the last 72 hours. Thyroid Function Tests: No results for input(s): TSH, T4TOTAL, FREET4, T3FREE, THYROIDAB in the last 72 hours. Anemia Panel: No results for input(s): VITAMINB12, FOLATE, FERRITIN, TIBC, IRON, RETICCTPCT in the last 72 hours. Sepsis Labs: No results for input(s): PROCALCITON, LATICACIDVEN in the last 168 hours.  Recent Results (from the past 240 hour(s))  Culture, body fluid-bottle     Status: None (Preliminary result)   Collection Time: 03/03/18   4:11 PM  Result Value Ref Range Status   Specimen Description FLUID PERITONEAL  Final   Special Requests BOTTLES DRAWN AEROBIC AND ANAEROBIC  Final   Culture   Final    NO GROWTH < 12 HOURS Performed at [facility], [place] [address], [place], [place] [address]    Report Status PENDING  Incomplete  Gram stain     Status: None   Collection Time: 03/03/18  4:11 PM  Result Value Ref Range Status   Specimen Description FLUID PERITONEAL  Final   Special Requests NONE  Final   Gram Stain   Final    WBC PRESENT,BOTH PMN AND MONONUCLEAR NO ORGANISMS SEEN CYTOSPIN SMEAR Performed at [name], [address], [place], [place] [address]    Report Status 03/03/2018 FINAL  Final         Radiology Studies: US Paracentesis  Result Date: 03/03/2018 INDICATION: Patient with recurrent ascites. Request is made for diagnostic and therapeutic paracentesis EXAM: ULTRASOUND GUIDED DIAGNOSTIC AND THERAPEUTIC PARACENTESIS MEDICATIONS: None. COMPLICATIONS: None immediate. PROCEDURE: Informed written consent was obtained from the patient after a discussion of the risks, benefits and alternatives to treatment. A timeout was performed prior to the initiation of the procedure. Initial  ultrasound scanning demonstrates a large amount of ascites within the right lower abdominal quadrant. The right lower abdomen was prepped and draped in the usual sterile fashion. 1% lidocaine was used for local anesthesia. Following this, a 19 gauge, 7-cm, Yueh catheter was introduced. An ultrasound image was saved for documentation purposes. The paracentesis was performed. The catheter was removed and a dressing was applied. The patient tolerated the procedure well without immediate post procedural complication. FINDINGS: A total of approximately 6.2 liters of clear, yellow fluid was removed. Samples were sent to the laboratory as requested by the clinical team. IMPRESSION: Successful ultrasound-guided diagnostic and  therapeutic paracentesis yielding 6.2 liters of peritoneal fluid. Read by: Brynda Greathouse PA-C Electronically Signed   By: Aletta Edouard M.D.   On: 03/03/2018 16:25        Scheduled Meds: . amoxicillin-clavulanate  1 tablet Oral Q12H  . B-complex with vitamin C  1 tablet Oral Daily  . diclofenac sodium  4 g Topical QID  . doxycycline  100 mg Oral Q12H  . enoxaparin (LOVENOX) injection  40 mg Subcutaneous Q24H  . feeding supplement (ENSURE ENLIVE)  237 mL Oral BID BM  . lactulose  10 g Oral Daily  . multivitamin with minerals  1 tablet Oral Daily  . traZODone  100 mg Oral QHS  . triamcinolone cream   Topical BID   Continuous Infusions: . albumin human       LOS: 11 days     Cordelia Poche, MD [facility] 03/04/2018, 11:37 AM  If 7PM-7AM, please contact night-coverage [email or web address]

## 2018-03-04 NOTE — Progress Notes (Addendum)
Pt authorized by [facility] to admit to SNF. Updated pt's mother. Pt completed admission paperwork with liaison. Will assist with transition to [facility] at [place].  Sharren Bridge, MSW, LCSW Clinical Social Work 03/04/2018 [phone]

## 2018-03-04 NOTE — Progress Notes (Signed)
IV team has restarted IV. Albumin started.

## 2018-03-04 NOTE — Progress Notes (Signed)
IV albumin has arrived to unit , current IV leaking, IV team notified due to this nurse unable to obtain IV access. Will start IV albumin as soon as IV is in place.

## 2018-03-05 LAB — PATHOLOGIST SMEAR REVIEW

## 2018-03-05 MED ORDER — DOXYCYCLINE HYCLATE 100 MG PO TABS: 100.0000 mg | ORAL_TABLET | Freq: Two times a day (BID) | ORAL | 0 refills | Status: AC

## 2018-03-05 MED ORDER — AMOXICILLIN-POT CLAVULANATE 875-125 MG PO TABS: 1.0000 | ORAL_TABLET | Freq: Two times a day (BID) | ORAL | 0 refills | Status: AC

## 2018-03-05 MED ORDER — SODIUM CHLORIDE 0.9 % IV BOLUS
250.0000 mL | Freq: Once | INTRAVENOUS | Status: AC
  Administered 2018-03-05: 250 mL via INTRAVENOUS

## 2018-03-05 MED ORDER — HYDROCODONE-ACETAMINOPHEN 5-325 MG PO TABS: 1.0000 | ORAL_TABLET | ORAL | 0 refills | Status: AC | PRN

## 2018-03-05 MED ORDER — TRIAMCINOLONE ACETONIDE 0.5 % EX CREA: TOPICAL_CREAM | Freq: Two times a day (BID) | CUTANEOUS | 0 refills | Status: AC

## 2018-03-05 NOTE — Progress Notes (Signed)
Nutrition Follow-up  DOCUMENTATION CODES:   Severe malnutrition in context of chronic illness, Underweight  INTERVENTION:   Continue Ensure Enlive po BID, each supplement provides 350 kcal and 20 grams of protein  NUTRITION DIAGNOSIS:   Severe Malnutrition related to chronic illness, cancer and cancer related treatments as evidenced by percent weight loss, severe fat depletion, severe muscle depletion.  Ongoing.  GOAL:   Patient will meet greater than or equal to 90% of their needs  Progressing.  MONITOR:   PO intake, Supplement acceptance, Labs, Weight trends, Skin, I & O's  ASSESSMENT:   62 y.o. male with a history of liver cirrhosis secondary to hepatitis C and alcoholism, hepatocellular carcinoma stage II, alcohol abuse, hypertension.  Patient presented secondary to confusion and being found down.   Patient consumed 80-90% of meals on 10/20. Pt is s/p paracentesis on 10/21 which yielded 6.2 L. Will continue Ensure supplements as pt is drinking at least 1 daily.  Weight had been increasing d/t fluid but is decreased now after paracentesis.  Medications: B complex with Vitamin C tablet daily, Multivitamin with minerals daily Labs reviewed (10/21): Low Na Elevated K  Diet Order:   Diet Order            Diet regular Room service appropriate? Yes; Fluid consistency: Thin  Diet effective now              EDUCATION NEEDS:   Not appropriate for education at this time  Skin:  Skin Assessment: Skin Integrity Issues: Skin Integrity Issues:: Stage III, Other (Comment) Stage III: sacrum Other: left necrotic toe, left leg  Last BM:  10/17  Height:   Ht Readings from Last 1 Encounters:  02/21/18 5\' 11"  (1.803 m)    Weight:   Wt Readings from Last 1 Encounters:  03/04/18 60.9 kg    Ideal Body Weight:  78.2 kg  BMI:  Body mass index is 18.73 kg/m.  Estimated Nutritional Needs:   Kcal:  1550-1750  Protein:  70-80g  Fluid:  1.7L/day  Clayton Bibles,  MS, RD, LDN [facility] Dietitian Pager: [phone] After Hours Pager: [phone]

## 2018-03-05 NOTE — Progress Notes (Addendum)
[facility] arrived to transport pt to [facility]. Pt in no distress. Bp-84/67, temp- 97.4 axillary, 02- 100 on ra, HR- 96, rr- 17.  PT has been discharged.  03/05/2018 9:57 PM Nedra Hai, RN

## 2018-03-05 NOTE — Discharge Summary (Addendum)
Physician Discharge Summary  Joshua [name]:[id number] DOB: Jul 24, 1955 DOA: 02/21/2018  PCP: [name], MD  Admit date: 02/21/2018 Discharge date: 03/05/2018  Admitted From: Home Disposition:  Home  Discharge Condition:Stable CODE STATUS:FULL, DNR, Comfort Care Diet recommendation: Heart Healthy / Carb Modified / Regular / Dysphagia   Brief/Interim Summary: Patient  is a 62 y.o. male with a history of liver cirrhosis secondary to hepatitis C and alcoholism, hepatocellular carcinoma stage II, alcohol abuse, hypertension.  Patient presented secondary to confusion and being found down.  He is found to have a sodium of 117 in setting of continued diuretic use.  Mental status has improved and sodium has also improved with IV fluids.  He was monitored by oncology, vascular surgery and palliative care during the hospitalization.  He is being discharged to skilled nursing facility.  He will be followed by palliative care at the skilled nursing facility.  Following problems were addressed during his hospitalization:  Hyponatremia In setting of poor oral intake. Complicated by liver disease, however, he has responded to IV fluids and holding of lasix. Contributed to by poor oral intake. Stable.Check BMP in a week.  Toxic/metabolic encephalopathy In setting of baclofen per chart review, however, patient started back on baclofen, so unsure. To date, he has received only one dose of baclofen. Concern for hepatic encephalopathy on admission, however, ammonia within normal limits. Mostly resolved. Patient sometimes waxes/wanes. Hold lactulose for now secondary to significant diarrhea. May need to restart soon at SNF after checking ammonia if elevated.  Abdominal pain Likely  associated ascites today. His abdominal pain is chronic.No SBP on the ascitic fluid analysis.  Afebrile.  Left fourth toe gangrene Patient with vascular disease leading to nonhealing ulcers on his leg in addition to  left fourth toe gangrene.  Vascular surgery was consulted , intervention not planned due to his multiple comorbidities and poor prognosis.  Was on Doxycycline/augmentin, he will complete 14 days course tomorrow  Severe malnutrition In setting of chronic illness. Albumin of 1.6 in setting of liver disease. Follow up with nutrition  Neck rash Unknown etiology. -Continue steroid cream  Cirrhosis secondary to hepatitis C and alcohol use, Hepatitis C, hepatocellular carcinoma Associated ascites. Fluid overloaded however blood pressure will likely not tolerate diuretic management.  Oncology was following here. Paracentesis as needed  Hypotension In setting of large volume paracentesis. Given Albumin.His blood pressure runs chronically low      Discharge Diagnoses:  Active Problems:   Essential hypertension   CIRRHOSIS, ALCOHOLIC   Chronic hepatitis C ([place])   Hepatocellular carcinoma (HCC)   Hyponatremia   Pressure injury of skin   Fall   Ulcer of toe ([place])   DNR (do not resuscitate)   Palliative care encounter   Protein-calorie malnutrition, severe    Discharge Instructions  Discharge Instructions    Diet - low sodium heart healthy   Complete by:  As directed    Discharge instructions   Complete by:  As directed    1) Please follow up with Palliative care/Hospice at the skilled nursing facility. 2) Follow up with Oncology as an outpatient. 3) Check CBC,BMP and Ammonia tests in a week.   Increase activity slowly   Complete by:  As directed      Allergies as of 03/05/2018   No Known Allergies     Medication List    STOP taking these medications   Baclofen 5 MG Tabs   cephALEXin 500 MG capsule Commonly known as:  KEFLEX  furosemide 40 MG tablet Commonly known as:  LASIX   lactulose 10 g packet Commonly known as:  CEPHULAC   Lenvatinib 12 mg daily dose 3 x 4 MG capsule Commonly known as:  LENVIMA   lisinopril-hydrochlorothiazide 10-12.5 MG  tablet Commonly known as:  PRINZIDE,ZESTORETIC   spironolactone 100 MG tablet Commonly known as:  ALDACTONE     TAKE these medications   acetaminophen 325 MG tablet Commonly known as:  TYLENOL Take 650 mg by mouth every 6 (six) hours as needed (for headaches.).   amoxicillin-clavulanate 875-125 MG tablet Commonly known as:  AUGMENTIN Take 1 tablet by mouth every 12 (twelve) hours.   b complex vitamins capsule Take 1 capsule by mouth daily.   doxycycline 100 MG tablet Commonly known as:  VIBRA-TABS Take 1 tablet (100 mg total) by mouth every 12 (twelve) hours.   HYDROcodone-acetaminophen 5-325 MG tablet Commonly known as:  NORCO/VICODIN Take 1-2 tablets by mouth every 4 (four) hours as needed for severe pain.   multivitamin with minerals Tabs tablet Take 1 tablet by mouth daily.   prochlorperazine 10 MG tablet Commonly known as:  COMPAZINE TAKE 1 TABLET(10 MG) BY MOUTH EVERY 6 HOURS AS NEEDED FOR NAUSEA OR VOMITING What changed:  See the new instructions.   traZODone 50 MG tablet Commonly known as:  DESYREL Take 2 tablets (100 mg total) by mouth at bedtime.   triamcinolone ointment 0.5 % Commonly known as:  KENALOG Apply 1 application topically 2 (two) times daily. What changed:  Another medication with the same name was added. Make sure you understand how and when to take each.   triamcinolone cream 0.5 % Commonly known as:  KENALOG Apply topically 2 (two) times daily. What changed:  You were already taking a medication with the same name, and this prescription was added. Make sure you understand how and when to take each.      Contact information for after-discharge care    Destination    [facility] .   Service:  Skilled Nursing Contact information: [place] [phone]             No Known Allergies  Consultations:  Interventional radiology, oncology, vascular surgery, palliative  care   Procedures/Studies: Dg Chest 2 View  Result Date: 02/21/2018 CLINICAL DATA:  62 year old male with multiple recent falls. Recent increased weakness. History of progressive hepatocellular carcinoma. EXAM: CHEST - 2 VIEW COMPARISON:  Chest CT 11/12/2017.  One-view chest 10/04/2016. FINDINGS: Semi upright AP and lateral views of the chest. Lower lung volumes. Allowing for portable technique the lungs are clear. Mediastinal contours remain normal. No pneumothorax or pleural effusion. No acute osseous abnormality identified. Paucity of bowel gas in the upper abdomen. IMPRESSION: No acute cardiopulmonary abnormality. Electronically Signed   By: Genevie Ann M.D.   On: 02/21/2018 13:29   Dg Lumbar Spine Complete  Result Date: 02/21/2018 CLINICAL DATA:  62 year old male with multiple recent falls. Increased weakness. Mid back pain radiating down the left leg. History of progressive HCC. EXAM: LUMBAR SPINE - COMPLETE 4+ VIEW COMPARISON:  CT Abdomen and Pelvis 06/17/2017. FINDINGS: Extensive Aortoiliac calcified atherosclerosis. Normal lumbar segmentation. Mild T12 superior endplate deformity appears stable since February. Stable lumbar vertebral height and alignment. No pars fracture. The sacral ala and SI joints appear intact. Chronic severe disc space loss at L2-L3 with vacuum disc and endplate spurring. Better preserved disc spaces elsewhere with intermittent endplate spurring. IMPRESSION: 1.  No acute osseous  abnormality identified in the lumbar spine. 2. 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 3. Extensive aortic Atherosclerosis (ICD10-I70.0). Electronically Signed   By: Genevie Ann M.D.   On: 02/21/2018 13:32   Mr Foot Left Wo Contrast  Result Date: 02/22/2018 CLINICAL DATA:  Third and fourth toe infection. Evaluate for osteomyelitis. EXAM: MRI OF THE LEFT FOOT WITHOUT CONTRAST TECHNIQUE: Multiplanar, multisequence MR imaging of the left foot was performed. No intravenous contrast was  administered. COMPARISON:  Left foot x-rays from yesterday. FINDINGS: Bones/Joint/Cartilage No marrow signal abnormality. No fracture or dislocation. Normal alignment. No joint effusion. Ligaments Collateral ligaments are intact.  Lisfranc ligament is intact. Muscles and Tendons Flexor, peroneal and extensor compartment tendons are intact. Diffuse fatty atrophy of the intrinsic muscles of the forefoot. Soft tissue Mild dorsal forefoot soft tissue swelling. No fluid collection or hematoma. No soft tissue mass. IMPRESSION: 1. No evidence of osteomyelitis. Electronically Signed   By: Titus Dubin M.D.   On: 02/22/2018 09:39   US Paracentesis  Result Date: 03/03/2018 INDICATION: Patient with recurrent ascites. Request is made for diagnostic and therapeutic paracentesis EXAM: ULTRASOUND GUIDED DIAGNOSTIC AND THERAPEUTIC PARACENTESIS MEDICATIONS: None. COMPLICATIONS: None immediate. PROCEDURE: Informed written consent was obtained from the patient after a discussion of the risks, benefits and alternatives to treatment. A timeout was performed prior to the initiation of the procedure. Initial ultrasound scanning demonstrates a large amount of ascites within the right lower abdominal quadrant. The right lower abdomen was prepped and draped in the usual sterile fashion. 1% lidocaine was used for local anesthesia. Following this, a 19 gauge, 7-cm, Yueh catheter was introduced. An ultrasound image was saved for documentation purposes. The paracentesis was performed. The catheter was removed and a dressing was applied. The patient tolerated the procedure well without immediate post procedural complication. FINDINGS: A total of approximately 6.2 liters of clear, yellow fluid was removed. Samples were sent to the laboratory as requested by the clinical team. IMPRESSION: Successful ultrasound-guided diagnostic and therapeutic paracentesis yielding 6.2 liters of peritoneal fluid. Read by: Brynda Greathouse PA-C Electronically  Signed   By: Aletta Edouard M.D.   On: 03/03/2018 16:25   US Paracentesis  Result Date: 02/28/2018 INDICATION: Patient with history of hepatitis-C, hepatocellular carcinoma, cirrhosis, and recurrent ascites. Request for therapeutic paracentesis. EXAM: ULTRASOUND GUIDED LEFT LOWER QUADRANT PARACENTESIS MEDICATIONS: None. COMPLICATIONS: None immediate. PROCEDURE: Informed written consent was obtained from the patient after a discussion of the risks, benefits and alternatives to treatment. A timeout was performed prior to the initiation of the procedure. Initial ultrasound scanning demonstrates a large amount of ascites within the left lower abdominal quadrant. The left lower abdomen was prepped and draped in the usual sterile fashion. 1% lidocaine with epinephrine was used for local anesthesia. Following this, a 19 gauge, 7-cm, Yueh catheter was introduced. An ultrasound image was saved for documentation purposes. The paracentesis was performed. The catheter was removed and a dressing was applied. The patient tolerated the procedure well without immediate post procedural complication. FINDINGS: A total of approximately 7.85 L of clear yellow fluid was removed. IMPRESSION: Successful ultrasound-guided paracentesis yielding 7.85 liters of peritoneal fluid. Read by: Ascencion Dike PA-C Electronically Signed   By: Sandi Mariscal M.D.   On: 02/28/2018 11:43   Dg Finger Ring Left  Result Date: 02/21/2018 CLINICAL DATA:  Fall.  Pain. EXAM: LEFT RING FINGER 2+V COMPARISON:  None. FINDINGS: There is no evidence of fracture or dislocation. There is no evidence of arthropathy or other focal  bone abnormality. Soft tissues are swollen. IMPRESSION: PIP joint soft tissue swelling.  No fracture or dislocation. Electronically Signed   By: Staci Righter M.D.   On: 02/21/2018 15:48   Dg Foot 2 Views Left  Result Date: 02/21/2018 CLINICAL DATA:  Left foot pain worst about the third and fourth toes due to a fall today. Initial  encounter. EXAM: LEFT FOOT - 2 VIEW COMPARISON:  None. FINDINGS: There is no evidence of fracture or dislocation. There is no evidence of arthropathy or other focal bone abnormality. Soft tissues are unremarkable. IMPRESSION: Negative exam. Electronically Signed   By: Inge Rise M.D.   On: 02/21/2018 15:53      Subjective:  Patient seen and examined the bedside this afternoon.  Blood pressure is soft but it has been like that for a while.  He is  alert and oriented.  Does not look in distress.  Stable for discharge to skilled nursing facility. Discharge Exam: Vitals:   03/05/18 0512 03/05/18 1000  BP: (!) 81/58 (!) 89/65  Pulse: 73 69  Resp: 17   Temp: 97.7 F (36.5 C)   SpO2: 100% 100%   Vitals:   03/04/18 1600 03/04/18 1900 03/05/18 0512 03/05/18 1000  BP: (!) 89/62 (!) 85/59 (!) 81/58 (!) 89/65  Pulse: 81 71 73 69  Resp: 16 16 17    Temp: 97.7 F (36.5 C) 97.7 F (36.5 C) 97.7 F (36.5 C)   TempSrc: Oral Oral Oral   SpO2: 100% 100% 100% 100%  Weight:  60.9 kg    Height:        General: Pt is alert, awake, not in acute distress,cachetic Cardiovascular: RRR, S1/S2 +, no rubs, no gallops Respiratory: CTA bilaterally, no wheezing, no rhonchi Abdominal: Soft, NT, ND, bowel sounds + Extremities: no edema, no cyanosis, gangrene of the left fourth toe    The results of significant diagnostics from this hospitalization (including imaging, microbiology, ancillary and laboratory) are listed below for reference.     Microbiology: Recent Results (from the past 240 hour(s))  Culture, body fluid-bottle     Status: None (Preliminary result)   Collection Time: 03/03/18  4:11 PM  Result Value Ref Range Status   Specimen Description FLUID PERITONEAL  Final   Special Requests BOTTLES DRAWN AEROBIC AND ANAEROBIC  Final   Culture   Final    NO GROWTH 2 DAYS Performed at [facility], [address] [place], [place] [address]    Report Status PENDING  Incomplete  Gram  stain     Status: None   Collection Time: 03/03/18  4:11 PM  Result Value Ref Range Status   Specimen Description FLUID PERITONEAL  Final   Special Requests NONE  Final   Gram Stain   Final    WBC PRESENT,BOTH PMN AND MONONUCLEAR NO ORGANISMS SEEN CYTOSPIN SMEAR Performed at [facility], [address], [place], [place] [address]    Report Status 03/03/2018 FINAL  Final     Labs: BNP (last 3 results) Recent Labs    02/21/18 1236  BNP 323.5*   Basic Metabolic Panel: Recent Labs  Lab 02/27/18 0402 02/28/18 0501 02/28/18 1416 03/01/18 0416 03/03/18 0516  NA 126* 126* 128* 127* 128*  K 4.9 5.0 4.7 4.5 5.6*  CL 101 101 104 103 101  CO2 17* 17* 19* 17* 21*  GLUCOSE 113* 123* 124* 115* 108*  BUN 31* 38* 42* 44* 49*  CREATININE 0.70 0.81 0.98 0.84 0.83  CALCIUM  7.7* 7.8* 7.7* 7.8* 8.4*   Liver Function Tests: No results for input(s): AST, ALT, ALKPHOS, BILITOT, PROT, ALBUMIN in the last 168 hours. No results for input(s): LIPASE, AMYLASE in the last 168 hours. No results for input(s): AMMONIA in the last 168 hours. CBC: Recent Labs  Lab 02/28/18 0501 03/01/18 0416 03/03/18 0516  WBC 17.3* 16.1* 14.2*  NEUTROABS  --  14.2*  --   HGB 12.8* 11.4* 13.0  HCT 39.3 34.7* 38.8*  MCV 96.6 97.2 94.6  PLT 224 191 228   Cardiac Enzymes: No results for input(s): CKTOTAL, CKMB, CKMBINDEX, TROPONINI in the last 168 hours. BNP: Invalid input(s): POCBNP CBG: No results for input(s): GLUCAP in the last 168 hours. D-Dimer No results for input(s): DDIMER in the last 72 hours. Hgb A1c No results for input(s): HGBA1C in the last 72 hours. Lipid Profile No results for input(s): CHOL, HDL, LDLCALC, TRIG, CHOLHDL, LDLDIRECT in the last 72 hours. Thyroid function studies No results for input(s): TSH, T4TOTAL, T3FREE, THYROIDAB in the last 72 hours.  Invalid input(s): FREET3 Anemia work up No results for input(s): VITAMINB12, FOLATE, FERRITIN, TIBC, IRON, RETICCTPCT in the  last 72 hours. Urinalysis    Component Value Date/Time   COLORURINE YELLOW 02/21/2018 1814   APPEARANCEUR CLEAR 02/21/2018 1814   LABSPEC 1.004 (L) 02/21/2018 1814   PHURINE 6.0 02/21/2018 1814   GLUCOSEU NEGATIVE 02/21/2018 1814   HGBUR LARGE (A) 02/21/2018 1814   BILIRUBINUR NEGATIVE 02/21/2018 1814   KETONESUR NEGATIVE 02/21/2018 1814   PROTEINUR NEGATIVE 02/21/2018 1814   NITRITE NEGATIVE 02/21/2018 1814   LEUKOCYTESUR SMALL (A) 02/21/2018 1814   Sepsis Labs Invalid input(s): PROCALCITONIN,  WBC,  LACTICIDVEN Microbiology Recent Results (from the past 240 hour(s))  Culture, body fluid-bottle     Status: None (Preliminary result)   Collection Time: 03/03/18  4:11 PM  Result Value Ref Range Status   Specimen Description FLUID PERITONEAL  Final   Special Requests BOTTLES DRAWN AEROBIC AND ANAEROBIC  Final   Culture   Final    NO GROWTH 2 DAYS Performed at [facility], [place] [address] [place], [place] [address]    Report Status PENDING  Incomplete  Gram stain     Status: None   Collection Time: 03/03/18  4:11 PM  Result Value Ref Range Status   Specimen Description FLUID PERITONEAL  Final   Special Requests NONE  Final   Gram Stain   Final    WBC PRESENT,BOTH PMN AND MONONUCLEAR NO ORGANISMS SEEN CYTOSPIN SMEAR Performed at [facility], [address], [place],  [address]    Report Status 03/03/2018 FINAL  Final    Please note: You were cared for by a hospitalist during your hospital stay. Once you are discharged, your primary care physician will handle any further medical issues. Please note that NO REFILLS for any discharge medications will be authorized once you are discharged, as it is imperative that you return to your primary care physician (or establish a relationship with a primary care physician if you do not have one) for your post hospital discharge needs so that they can reassess your need for medications and monitor your lab  values.    Time coordinating discharge: 40 minutes  SIGNED:   Shelly Coss, MD  [facility] 03/05/2018, 2:04 PM Pager [phone]  If 7PM-7AM, please contact night-coverage [email or web address] Password [email or web address]

## 2018-03-05 NOTE — Progress Notes (Signed)
Pt admitting to [facility] room 140- report #[phone]. [phone] E Pt's mother and father agreeable to plan- notified them of DC by phone.  DC information provided via the [facility].  Sharren Bridge, MSW, LCSW Clinical Social Work 03/05/2018 [phone]

## 2018-03-08 LAB — CULTURE, BODY FLUID W GRAM STAIN -BOTTLE: Culture: NO GROWTH

## 2018-03-14 DEATH — deceased

## 2018-03-17 ENCOUNTER — Other Ambulatory Visit: Payer: Self-pay | Admitting: Radiology

## 2018-03-18 ENCOUNTER — Ambulatory Visit: Payer: BLUE CROSS/BLUE SHIELD | Admitting: Nurse Practitioner

## 2018-03-18 ENCOUNTER — Ambulatory Visit: Payer: BLUE CROSS/BLUE SHIELD

## 2018-03-18 ENCOUNTER — Other Ambulatory Visit: Payer: BLUE CROSS/BLUE SHIELD

## 2019-02-21 IMAGING — US US PARACENTESIS
1 series · 8 of 8 positions shown · non-contrast
Comparison: none

INDICATION: Patient with history of hepatitis-C, hepatocellular carcinoma,
cirrhosis, recurrent ascites. Request made for diagnostic and
therapeutic paracentesis up to 5 liters.

[Series 1: us paracentesis · 8 of 8 slices shown]
[im 1/8]
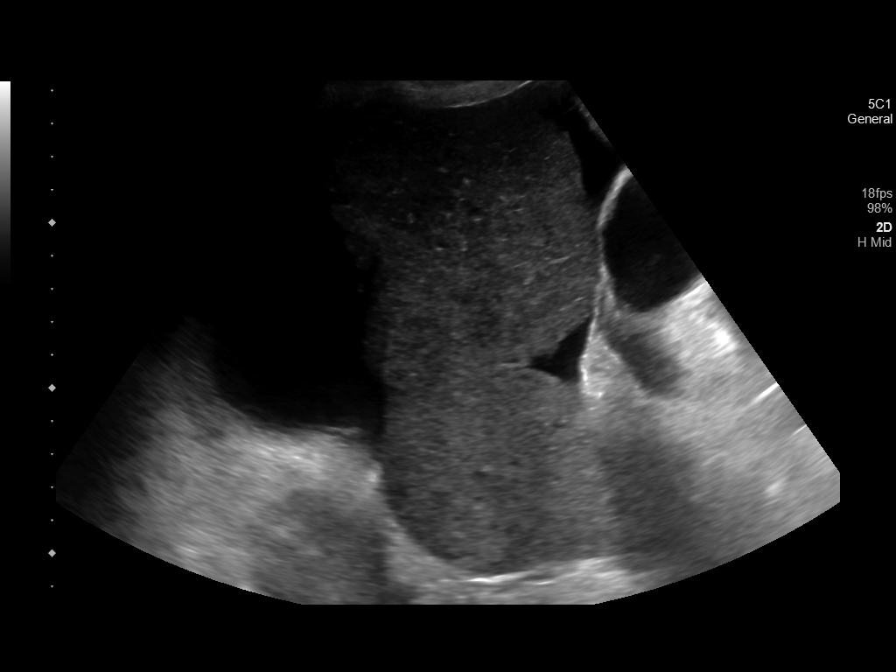
[im 2/8]
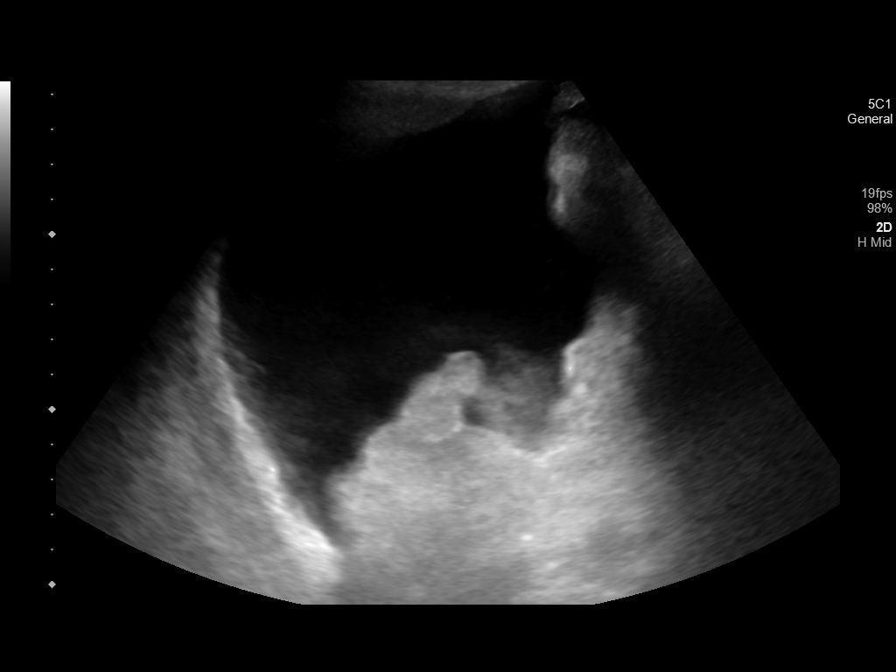
[im 3/8]
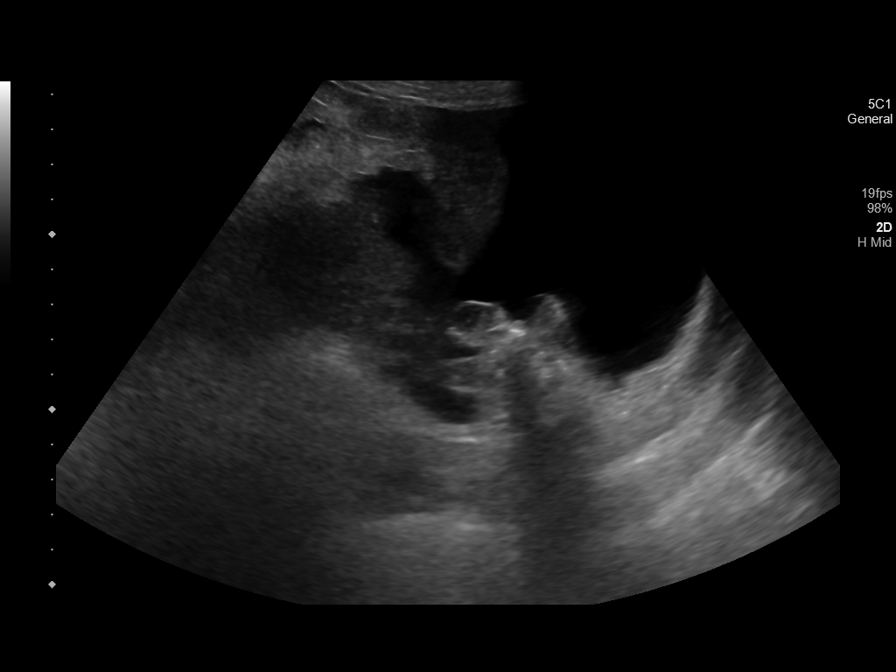
[im 4/8]
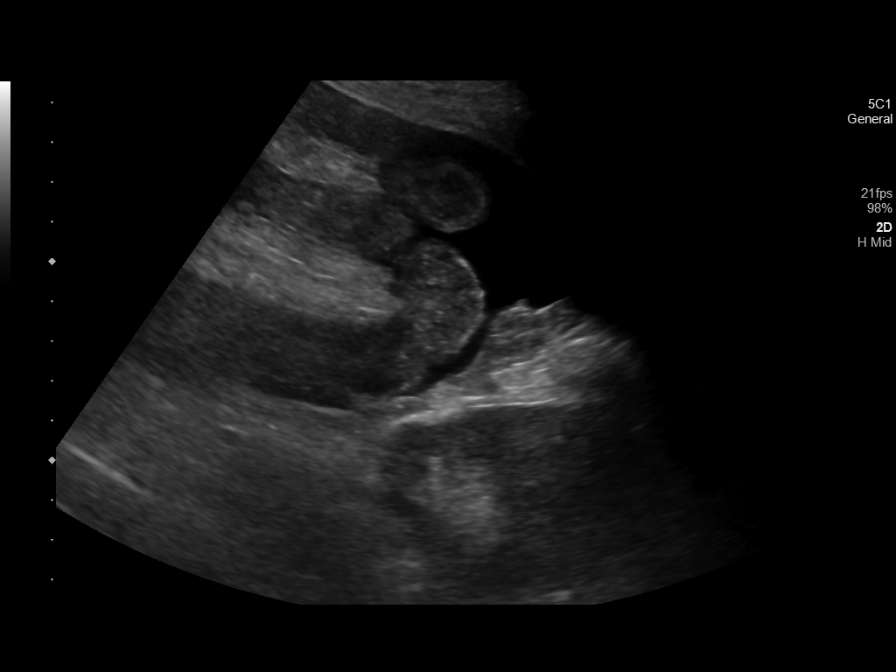
[im 5/8]
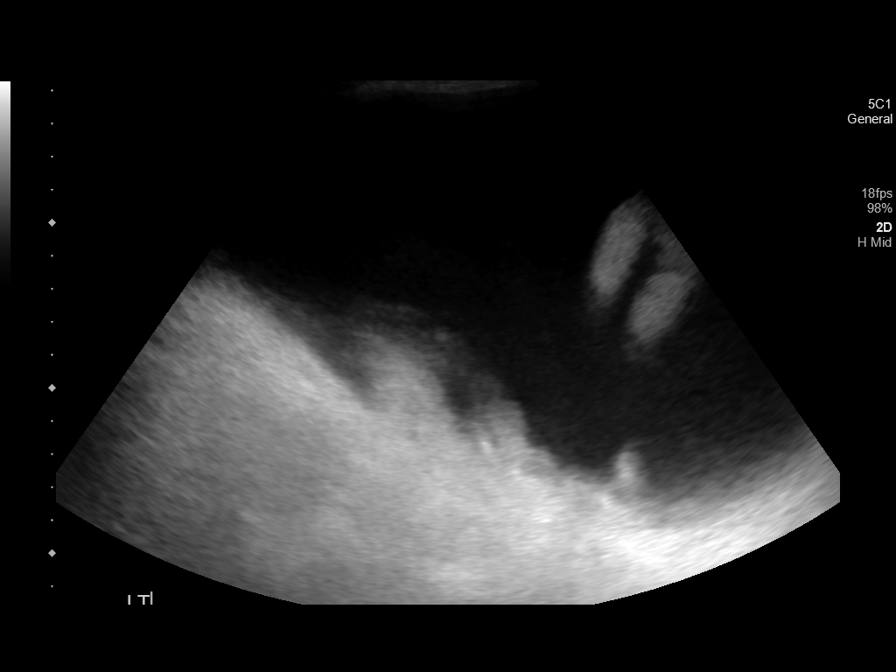
[im 6/8]
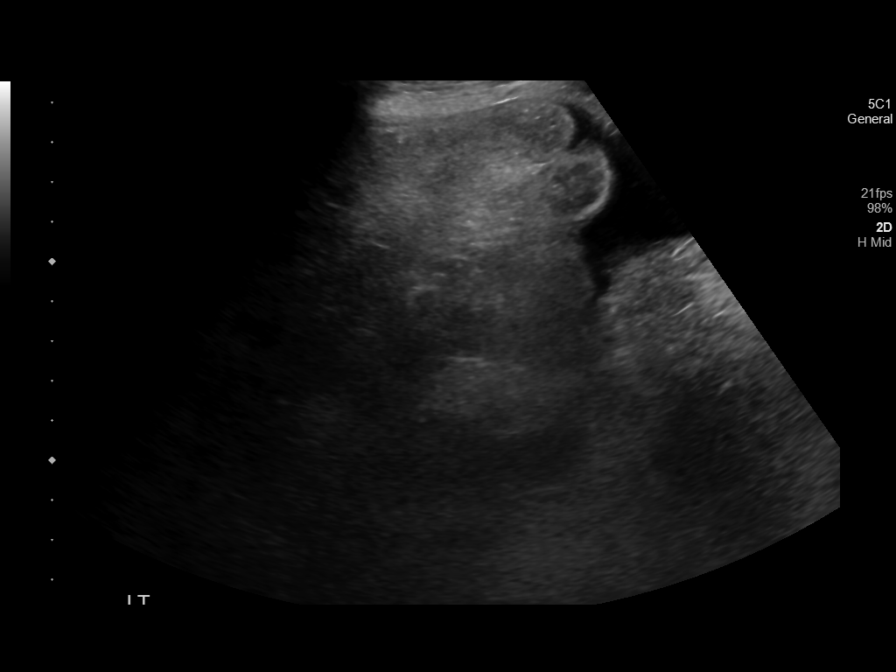
[im 7/8]
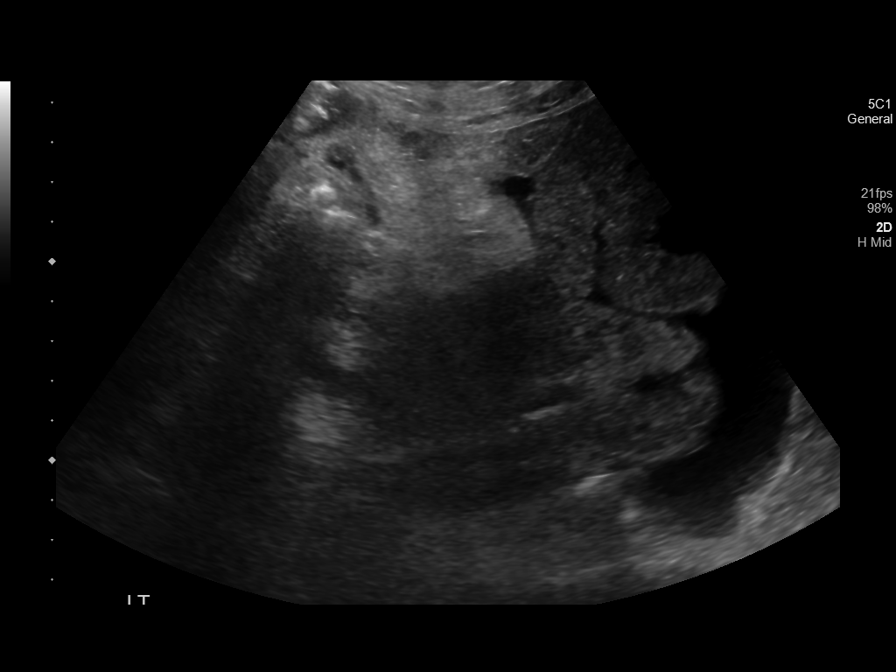
[im 8/8]
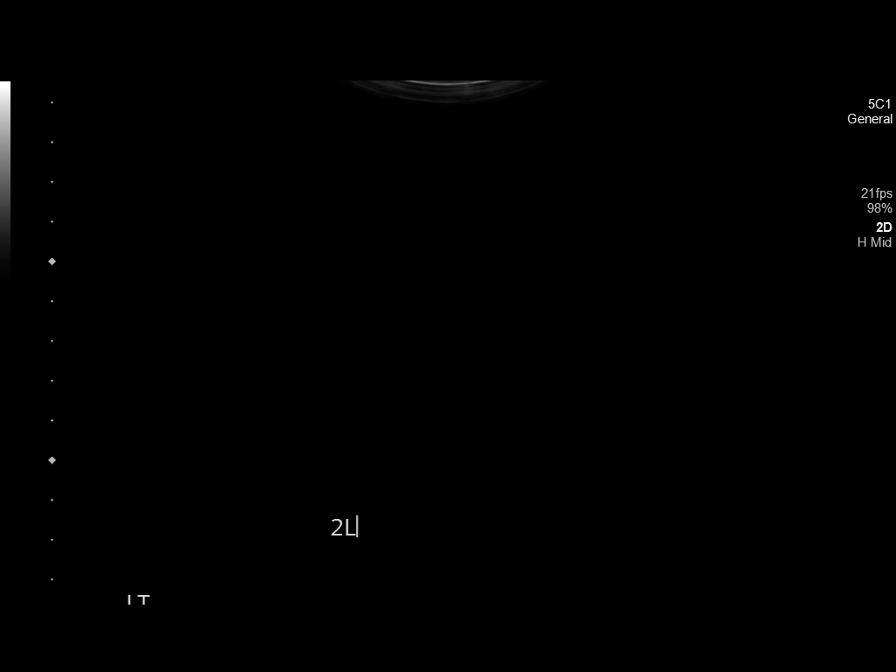

[8 of 8 positions shown; findings below may reference images not displayed]

EXAM:
ULTRASOUND GUIDED DIAGNOSTIC AND THERAPEUTIC PARACENTESIS

MEDICATIONS:
None

COMPLICATIONS:
None immediate.

PROCEDURE:
Informed written consent was obtained from the patient after a
discussion of the risks, benefits and alternatives to treatment. A
timeout was performed prior to the initiation of the procedure.

Initial ultrasound scanning demonstrates a small to moderate amount
of ascites within the left lower abdominal quadrant. The left lower
abdomen was prepped and draped in the usual sterile fashion. 1%
lidocaine was used for local anesthesia.

Following this, a 6 Fr Safe-T-Centesis catheter was introduced. An
ultrasound image was saved for documentation purposes. The
paracentesis was performed. The catheter was removed and a dressing
was applied. The patient tolerated the procedure well without
immediate post procedural complication.
FINDINGS: A total of approximately 2 liters of yellow fluid was removed.
Samples were sent to the laboratory as requested by the clinical
team.
IMPRESSION: Successful ultrasound-guided diagnostic and therapeutic paracentesis
yielding 2 liters of peritoneal fluid.

## 2019-03-15 IMAGING — MR MR ABDOMEN WO/W CM
10 of 19 series · 21 of 48 positions shown · IV contrast (gadavist)
Comparison: 10/16/2017

CLINICAL DATA: Hepatocellular carcinoma. Status post multiple
therapies, including [AGE] embolization of the right hepatic lobe on
07/09/2017.

EXAM:
MRI ABDOMEN WITHOUT AND WITH CONTRAST
TECHNIQUE: Multiplanar multisequence MR imaging of the abdomen was performed
both before and after the administration of intravenous contrast.
CONTRAST:  6 cc Gadavist

[Series 3: T2 fat-sat · axial · 5.0mm · 0.78mm/px · 1 of 59 slices shown]
[im 1/59]
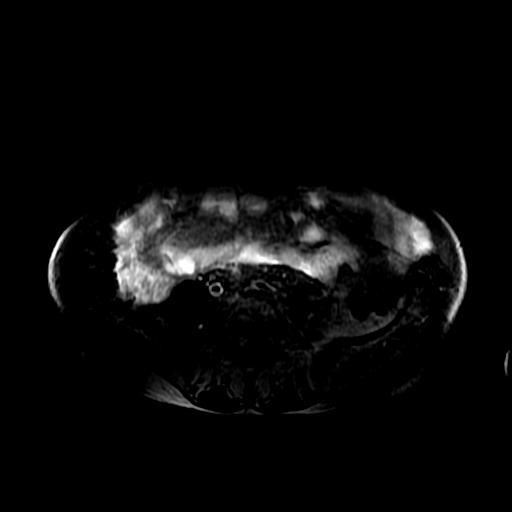

[Series 4: DWI b500 · axial · 6.0mm · 1.48mm/px · z∈[-86,+156]mm · 2 of 63 slices shown]
[im 1/63]
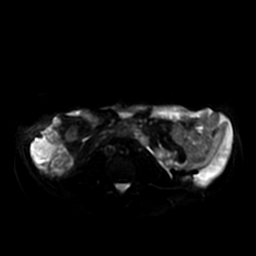
[im 63/63]
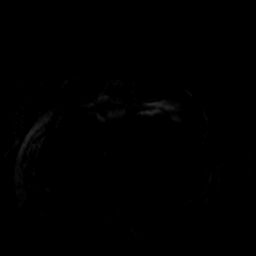

[Series 5: T2 · axial · 5.0mm · 0.78mm/px · z∈[-126,+164]mm · 2 of 59 slices shown (1 of 2)]
[im 1/59]
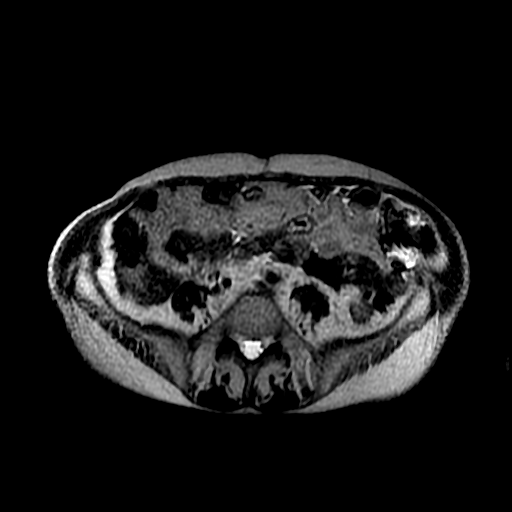
[im 59/59]
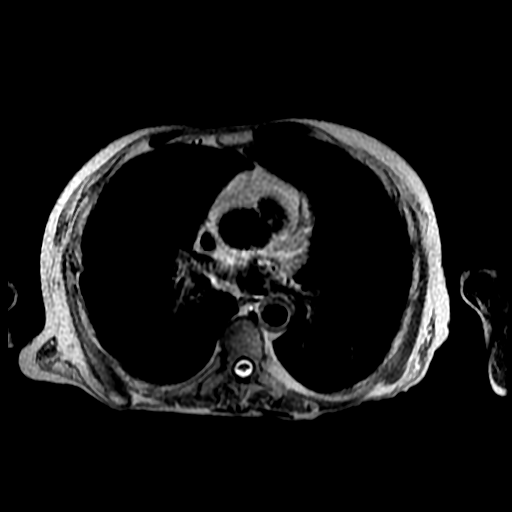

[Series 6: T2 · coronal · 5.0mm · 0.78mm/px · 2 of 48 slices shown (2 of 2)]
[im 1/48]
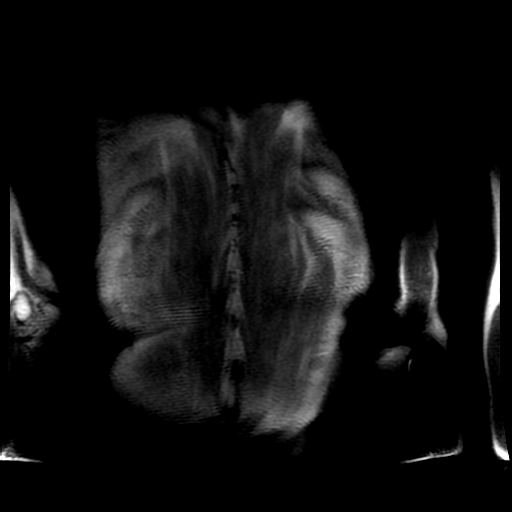
[im 48/48]
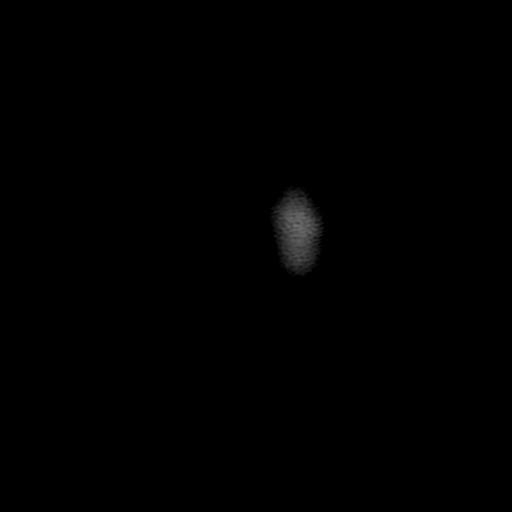

[Series 7: bSSFP · axial · 5.0mm · 0.78mm/px · z∈[-126,+164]mm · 2 of 59 slices shown]
[im 1/59]
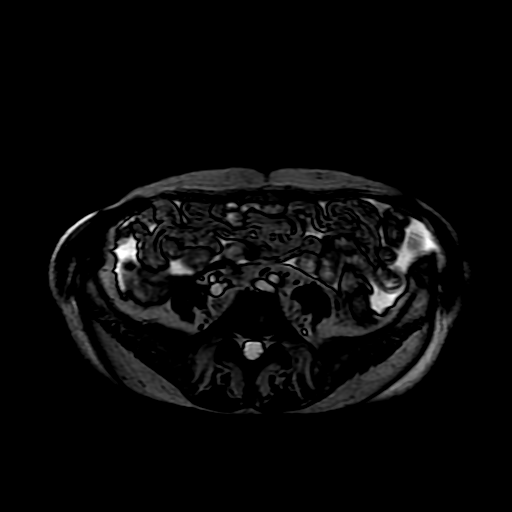
[im 59/59]
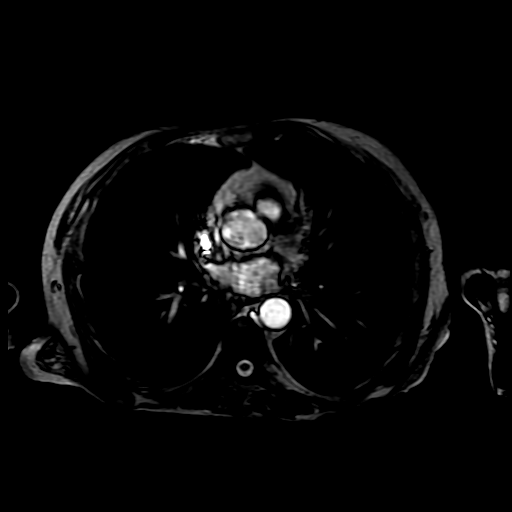

[Series 8: ax dualecho · axial · 5.0mm · 0.78mm/px · z∈[-126,+164]mm · 4 of 118 slices shown]
[im 1/118]
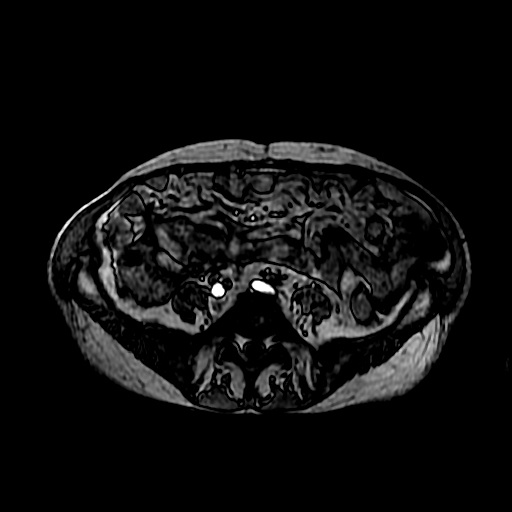
[im 40/118]
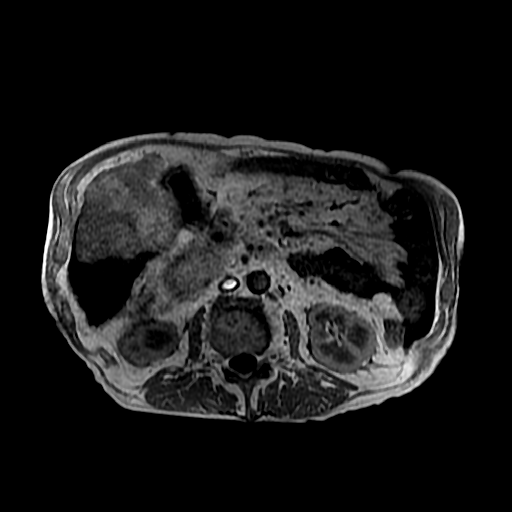
[im 79/118]
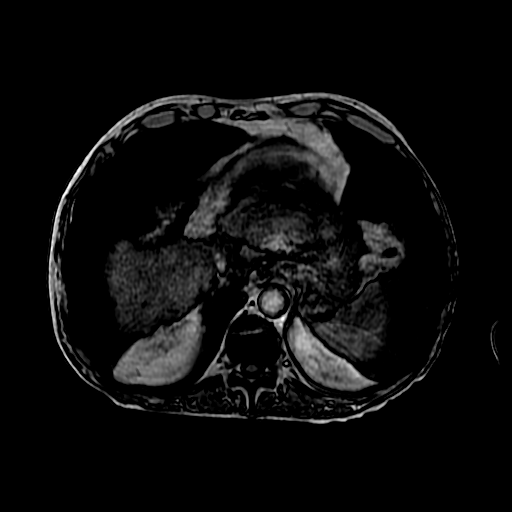
[im 118/118]
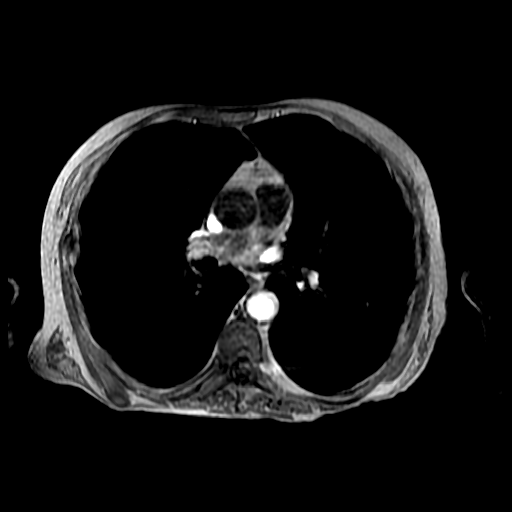

[Series 400: DWI · axial · 6.0mm · 1.48mm/px · 1 of 32 slices shown (1 of 2)]
[im 1/32]
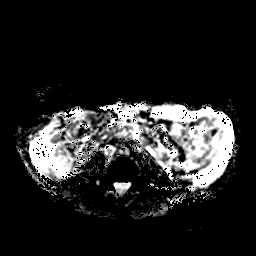

[Series 401: DWI · axial · 6.0mm · 1.48mm/px · 1 of 32 slices shown (2 of 2)]
[im 1/32]
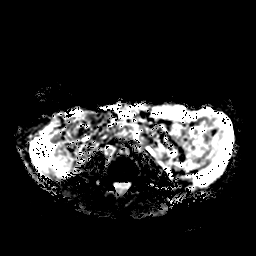

[Series 900: T1 dynamic · axial · 5.6mm · 0.78mm/px · z∈[-88,+156]mm · 3 of 88 slices shown (1 of 2)]
[im 1/88]
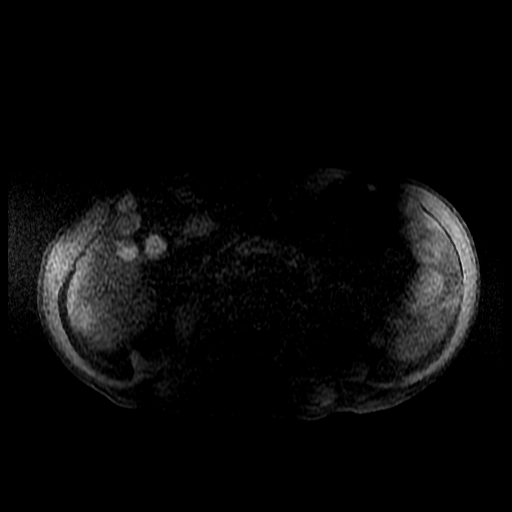
[im 44/88]
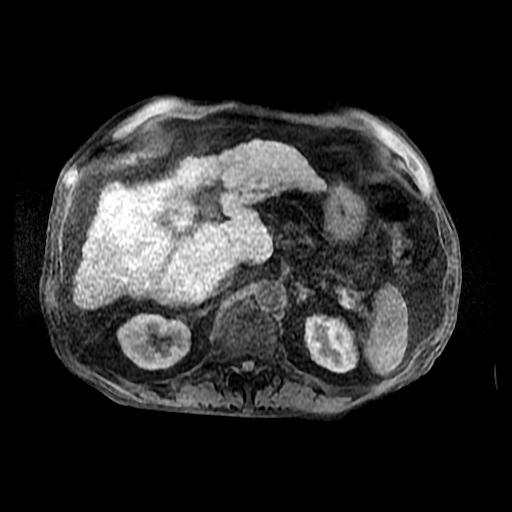
[im 88/88]
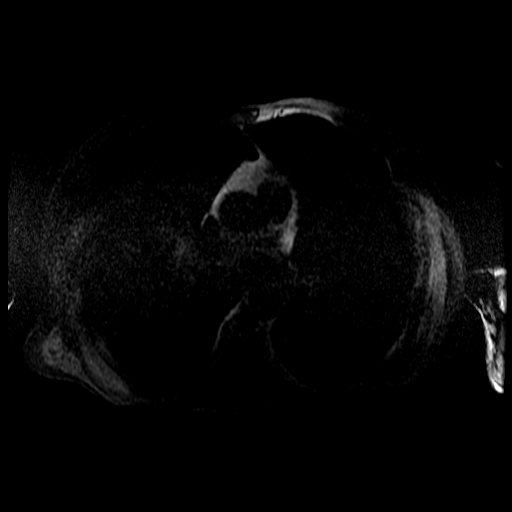

[Series 901: T1 dynamic · axial · 5.6mm · 0.78mm/px · z∈[-88,+156]mm · 3 of 88 slices shown (2 of 2)]
[im 1/88]
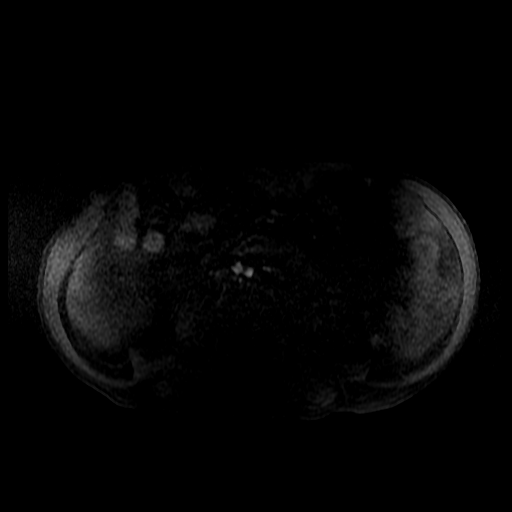
[im 44/88]
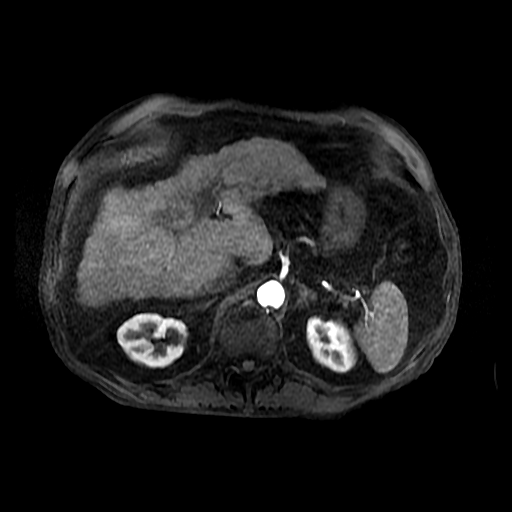
[im 88/88]
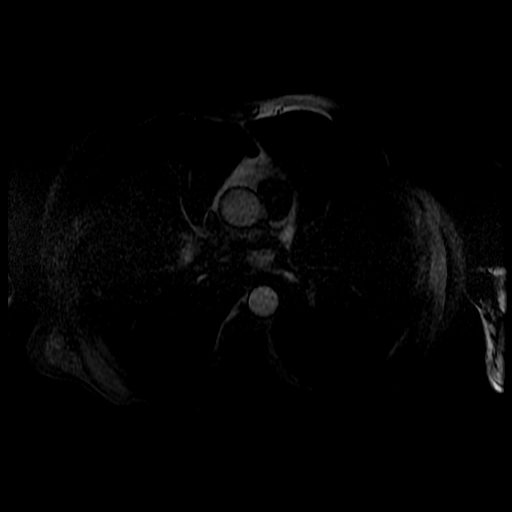

[21 of 48 positions shown; findings below may reference images not displayed]

FINDINGS: Lower chest: Normal heart size without pericardial or pleural
effusion.

Hepatobiliary: Marked cirrhosis. The previously detailed liver
lesions were primarily identified on arterial phase images.
Unfortunately, today's arterial phase images are suboptimally timed,
limiting direct comparison. Therefore, direct comparison was made
between today's portal venous phase series 903 and 904 versus
comparable series 7730 of the prior.

High right hepatic lobe 2.4 cm lesion on image 32/903 is similar to
on the prior.

Immediately cephalad and posterior 1.8 cm lesion image 29/904
measured 2.2 cm on the prior (when remeasured).

Just anterior to this, a 2.4 cm lesion on image 29/903 is not
readily apparent on the prior.

More inferior and lateral infiltrative subcapsular right hepatic
lobe lesion measures 5.4 x 4.3 cm on image 46/903. Compare 6.5 x
cm on the prior exam (when remeasured).

Just medial to this, a 1.1 cm lesion on image 49/903 measured 1.7 cm
on the prior exam (when remeasured).

Ablation defect within the subcapsular right hepatic lobe at 4.3 cm.

11 mm gallstone without acute cholecystitis or biliary duct
dilatation.

Pancreas:  Normal, without mass or ductal dilatation.

Spleen:  Normal in size, without focal abnormality.

Adrenals/Urinary Tract: Normal adrenal glands. Too small to
characterize lesions within both kidneys. No hydronephrosis.

Stomach/Bowel: Suspect small esophageal varices. Normal stomach and
abdominal bowel loops.

Vascular/Lymphatic: Advanced aortic and branch vessel
atherosclerosis. Patent portal and splenic veins. No abdominal
adenopathy.

Other:  Moderate volume ascites is similar.

Musculoskeletal: No acute osseous abnormality.
IMPRESSION: 1. Direct comparison to the prior exam is limited secondary to lack
of well timed arterial phase imaging today. By comparing portal
venous phase images of the 2 exams, the tumor burden is primarily
felt to be decreased. There is likely a new or progressive lesion
within the high anterior right liver lobe, as detailed above.
2. No findings of metastatic disease within the abdomen.
3. Moderate volume ascites.
4. Cholelithiasis.
